# Patient Record
Sex: Male | Born: 1940
Health system: Southern US, Community
[De-identification: ages and names within clinical notes are randomized; demographics above are authoritative.]

## PROBLEM LIST (undated history)

## (undated) DIAGNOSIS — E785 Hyperlipidemia, unspecified: Secondary | ICD-10-CM

## (undated) DIAGNOSIS — I480 Paroxysmal atrial fibrillation: Secondary | ICD-10-CM

## (undated) DIAGNOSIS — J189 Pneumonia, unspecified organism: Secondary | ICD-10-CM

## (undated) DIAGNOSIS — E119 Type 2 diabetes mellitus without complications: Secondary | ICD-10-CM

## (undated) DIAGNOSIS — D649 Anemia, unspecified: Secondary | ICD-10-CM

## (undated) DIAGNOSIS — E11319 Type 2 diabetes mellitus with unspecified diabetic retinopathy without macular edema: Secondary | ICD-10-CM

## (undated) DIAGNOSIS — H409 Unspecified glaucoma: Secondary | ICD-10-CM

## (undated) DIAGNOSIS — R001 Bradycardia, unspecified: Secondary | ICD-10-CM

## (undated) DIAGNOSIS — I2510 Atherosclerotic heart disease of native coronary artery without angina pectoris: Secondary | ICD-10-CM

## (undated) DIAGNOSIS — R0600 Dyspnea, unspecified: Secondary | ICD-10-CM

## (undated) DIAGNOSIS — N189 Chronic kidney disease, unspecified: Secondary | ICD-10-CM

## (undated) DIAGNOSIS — I10 Essential (primary) hypertension: Secondary | ICD-10-CM

## (undated) DIAGNOSIS — R0609 Other forms of dyspnea: Secondary | ICD-10-CM

## (undated) DIAGNOSIS — E669 Obesity, unspecified: Secondary | ICD-10-CM

## (undated) DIAGNOSIS — R0789 Other chest pain: Secondary | ICD-10-CM

## (undated) HISTORY — DX: Other forms of dyspnea: R06.09

## (undated) HISTORY — DX: Paroxysmal atrial fibrillation: I48.0

## (undated) HISTORY — DX: Essential (primary) hypertension: I10

## (undated) HISTORY — DX: Type 2 diabetes mellitus without complications: E11.9

## (undated) HISTORY — DX: Chronic kidney disease, unspecified: N18.9

## (undated) HISTORY — DX: Type 2 diabetes mellitus with unspecified diabetic retinopathy without macular edema: E11.319

## (undated) HISTORY — DX: Obesity, unspecified: E66.9

## (undated) HISTORY — DX: Dyspnea, unspecified: R06.00

## (undated) HISTORY — DX: Hyperlipidemia, unspecified: E78.5

## (undated) HISTORY — DX: Unspecified glaucoma: H40.9

## (undated) HISTORY — DX: Other chest pain: R07.89

## (undated) HISTORY — PX: ACHILLES TENDON SURGERY: SHX542

---

## 2007-12-28 ENCOUNTER — Emergency Department (HOSPITAL_COMMUNITY): Admission: EM | Admit: 2007-12-28 | Discharge: 2007-12-29 | Payer: Self-pay | Admitting: Emergency Medicine

## 2008-04-09 HISTORY — PX: OTHER SURGICAL HISTORY: SHX169

## 2008-04-10 HISTORY — PX: DOPPLER ECHOCARDIOGRAPHY: SHX263

## 2009-05-28 ENCOUNTER — Encounter: Admission: RE | Admit: 2009-05-28 | Discharge: 2009-05-28 | Payer: Self-pay | Admitting: Cardiology

## 2009-06-01 ENCOUNTER — Encounter: Admission: RE | Admit: 2009-06-01 | Discharge: 2009-06-01 | Payer: Self-pay | Admitting: Cardiology

## 2009-07-02 ENCOUNTER — Encounter: Admission: RE | Admit: 2009-07-02 | Discharge: 2009-07-02 | Payer: Self-pay | Admitting: Cardiology

## 2009-07-08 ENCOUNTER — Ambulatory Visit (HOSPITAL_COMMUNITY): Admission: RE | Admit: 2009-07-08 | Discharge: 2009-07-08 | Payer: Self-pay | Admitting: Cardiology

## 2009-07-08 HISTORY — PX: CARDIAC CATHETERIZATION: SHX172

## 2010-06-23 LAB — GLUCOSE, CAPILLARY
Glucose-Capillary: 146 mg/dL — ABNORMAL HIGH (ref 70–99)
Glucose-Capillary: 194 mg/dL — ABNORMAL HIGH (ref 70–99)

## 2012-06-09 ENCOUNTER — Encounter: Payer: Self-pay | Admitting: *Deleted

## 2012-06-18 ENCOUNTER — Encounter: Payer: Self-pay | Admitting: *Deleted

## 2012-06-27 ENCOUNTER — Encounter: Payer: Self-pay | Admitting: Cardiology

## 2014-06-29 ENCOUNTER — Emergency Department (HOSPITAL_COMMUNITY): Payer: Medicare Other

## 2014-06-29 ENCOUNTER — Encounter (HOSPITAL_COMMUNITY): Payer: Self-pay | Admitting: Nurse Practitioner

## 2014-06-29 ENCOUNTER — Inpatient Hospital Stay (HOSPITAL_COMMUNITY)
Admission: EM | Admit: 2014-06-29 | Discharge: 2014-06-30 | DRG: 195 | Disposition: A | Discharge status: Home / Self Care | Payer: Medicare Other | Attending: Internal Medicine | Admitting: Internal Medicine

## 2014-06-29 DIAGNOSIS — I4430 Unspecified atrioventricular block: Secondary | ICD-10-CM | POA: Diagnosis not present

## 2014-06-29 DIAGNOSIS — J189 Pneumonia, unspecified organism: Principal | ICD-10-CM | POA: Diagnosis present

## 2014-06-29 DIAGNOSIS — R001 Bradycardia, unspecified: Secondary | ICD-10-CM | POA: Diagnosis present

## 2014-06-29 DIAGNOSIS — I441 Atrioventricular block, second degree: Secondary | ICD-10-CM | POA: Diagnosis not present

## 2014-06-29 DIAGNOSIS — I2510 Atherosclerotic heart disease of native coronary artery without angina pectoris: Secondary | ICD-10-CM | POA: Diagnosis present

## 2014-06-29 DIAGNOSIS — I440 Atrioventricular block, first degree: Secondary | ICD-10-CM | POA: Diagnosis present

## 2014-06-29 DIAGNOSIS — E785 Hyperlipidemia, unspecified: Secondary | ICD-10-CM | POA: Diagnosis present

## 2014-06-29 DIAGNOSIS — Z6838 Body mass index (BMI) 38.0-38.9, adult: Secondary | ICD-10-CM | POA: Diagnosis not present

## 2014-06-29 DIAGNOSIS — E119 Type 2 diabetes mellitus without complications: Secondary | ICD-10-CM

## 2014-06-29 DIAGNOSIS — D649 Anemia, unspecified: Secondary | ICD-10-CM | POA: Diagnosis present

## 2014-06-29 DIAGNOSIS — E876 Hypokalemia: Secondary | ICD-10-CM

## 2014-06-29 DIAGNOSIS — I10 Essential (primary) hypertension: Secondary | ICD-10-CM | POA: Diagnosis present

## 2014-06-29 DIAGNOSIS — E669 Obesity, unspecified: Secondary | ICD-10-CM | POA: Diagnosis present

## 2014-06-29 DIAGNOSIS — E118 Type 2 diabetes mellitus with unspecified complications: Secondary | ICD-10-CM

## 2014-06-29 HISTORY — DX: Anemia, unspecified: D64.9

## 2014-06-29 HISTORY — DX: Pneumonia, unspecified organism: J18.9

## 2014-06-29 HISTORY — DX: Bradycardia, unspecified: R00.1

## 2014-06-29 HISTORY — DX: Atherosclerotic heart disease of native coronary artery without angina pectoris: I25.10

## 2014-06-29 LAB — I-STAT TROPONIN, ED: TROPONIN I, POC: 0.01 ng/mL (ref 0.00–0.08)

## 2014-06-29 LAB — BASIC METABOLIC PANEL
Anion gap: 11 (ref 5–15)
BUN: 16 mg/dL (ref 6–23)
CALCIUM: 9.8 mg/dL (ref 8.4–10.5)
CO2: 29 mmol/L (ref 19–32)
CREATININE: 1.38 mg/dL — AB (ref 0.50–1.35)
Chloride: 98 mmol/L (ref 96–112)
GFR calc non Af Amer: 50 mL/min — ABNORMAL LOW (ref >90)
GFR, EST AFRICAN AMERICAN: 57 mL/min — AB (ref >90)
GLUCOSE: 234 mg/dL — AB (ref 70–99)
Potassium: 3.4 mmol/L — ABNORMAL LOW (ref 3.5–5.1)
SODIUM: 138 mmol/L (ref 135–145)

## 2014-06-29 LAB — TSH: TSH: 2.132 u[IU]/mL (ref 0.350–4.500)

## 2014-06-29 LAB — TROPONIN I: Troponin I: 0.03 ng/mL (ref <0.031)

## 2014-06-29 LAB — CBC
HEMATOCRIT: 36.4 % — AB (ref 39.0–52.0)
Hemoglobin: 12 g/dL — ABNORMAL LOW (ref 13.0–17.0)
MCH: 28.8 pg (ref 26.0–34.0)
MCHC: 33 g/dL (ref 30.0–36.0)
MCV: 87.5 fL (ref 78.0–100.0)
PLATELETS: 263 10*3/uL (ref 150–400)
RBC: 4.16 MIL/uL — AB (ref 4.22–5.81)
RDW: 13.5 % (ref 11.5–15.5)
WBC: 6.9 10*3/uL (ref 4.0–10.5)

## 2014-06-29 LAB — SEDIMENTATION RATE: SED RATE: 10 mm/h (ref 0–16)

## 2014-06-29 LAB — BRAIN NATRIURETIC PEPTIDE: B Natriuretic Peptide: 95.2 pg/mL (ref 0.0–100.0)

## 2014-06-29 LAB — MAGNESIUM: Magnesium: 1.5 mg/dL (ref 1.5–2.5)

## 2014-06-29 MED ORDER — ENOXAPARIN SODIUM 30 MG/0.3ML ~~LOC~~ SOLN
30.0000 mg | SUBCUTANEOUS | Status: DC
  Administered 2014-06-29: 30 mg via SUBCUTANEOUS
  Filled 2014-06-29 (×2): qty 0.3

## 2014-06-29 MED ORDER — HYDRALAZINE HCL 100 MG PO TABS: 100.0000 mg | ORAL_TABLET | Freq: Three times a day (TID) | ORAL | Status: DC

## 2014-06-29 MED ORDER — LISINOPRIL 40 MG PO TABS
40.0000 mg | ORAL_TABLET | Freq: Every day | ORAL | Status: DC
  Administered 2014-06-30: 40 mg via ORAL
  Filled 2014-06-29: qty 1

## 2014-06-29 MED ORDER — SIMVASTATIN 10 MG PO TABS
10.0000 mg | ORAL_TABLET | Freq: Every day | ORAL | Status: DC
  Administered 2014-06-29: 10 mg via ORAL
  Filled 2014-06-29 (×2): qty 1

## 2014-06-29 MED ORDER — METFORMIN HCL 500 MG PO TABS: 1000.0000 mg | ORAL_TABLET | Freq: Two times a day (BID) | ORAL | Status: DC

## 2014-06-29 MED ORDER — SODIUM CHLORIDE 0.9 % IJ SOLN: 3.0000 mL | Freq: Two times a day (BID) | INTRAMUSCULAR | Status: DC

## 2014-06-29 MED ORDER — ADULT MULTIVITAMIN W/MINERALS CH
1.0000 | ORAL_TABLET | Freq: Every day | ORAL | Status: DC
  Administered 2014-06-30: 1 via ORAL
  Filled 2014-06-29: qty 1

## 2014-06-29 MED ORDER — ISOSORBIDE MONONITRATE ER 60 MG PO TB24
60.0000 mg | ORAL_TABLET | Freq: Every day | ORAL | Status: DC
  Administered 2014-06-30: 60 mg via ORAL
  Filled 2014-06-29: qty 1

## 2014-06-29 MED ORDER — KRILL OIL 300 MG PO CAPS: 300.0000 mg | ORAL_CAPSULE | Freq: Every day | ORAL | Status: DC

## 2014-06-29 MED ORDER — INSULIN ASPART 100 UNIT/ML ~~LOC~~ SOLN
0.0000 [IU] | Freq: Three times a day (TID) | SUBCUTANEOUS | Status: DC
  Administered 2014-06-30: 3 [IU] via SUBCUTANEOUS
  Administered 2014-06-30: 2 [IU] via SUBCUTANEOUS

## 2014-06-29 MED ORDER — ASPIRIN EC 81 MG PO TBEC
81.0000 mg | DELAYED_RELEASE_TABLET | Freq: Every day | ORAL | Status: DC
  Administered 2014-06-30: 81 mg via ORAL
  Filled 2014-06-29: qty 1

## 2014-06-29 MED ORDER — INSULIN ASPART 100 UNIT/ML ~~LOC~~ SOLN
0.0000 [IU] | Freq: Every day | SUBCUTANEOUS | Status: DC
  Administered 2014-06-30: 2 [IU] via SUBCUTANEOUS

## 2014-06-29 MED ORDER — CEFTRIAXONE SODIUM IN DEXTROSE 20 MG/ML IV SOLN
1.0000 g | INTRAVENOUS | Status: DC
  Filled 2014-06-29: qty 50

## 2014-06-29 MED ORDER — HYDRALAZINE HCL 50 MG PO TABS
100.0000 mg | ORAL_TABLET | Freq: Three times a day (TID) | ORAL | Status: DC
  Administered 2014-06-29 – 2014-06-30 (×2): 100 mg via ORAL
  Filled 2014-06-29 (×5): qty 2

## 2014-06-29 MED ORDER — ACETAMINOPHEN 650 MG RE SUPP
650.0000 mg | Freq: Four times a day (QID) | RECTAL | Status: DC | PRN
Start: 2014-06-29 — End: 2014-06-30

## 2014-06-29 MED ORDER — CEFTRIAXONE SODIUM 1 G IJ SOLR
1.0000 g | Freq: Once | INTRAMUSCULAR | Status: AC
  Administered 2014-06-29: 1 g via INTRAVENOUS
  Filled 2014-06-29: qty 10

## 2014-06-29 MED ORDER — NITROGLYCERIN 0.4 MG SL SUBL: 0.4000 mg | SUBLINGUAL_TABLET | SUBLINGUAL | Status: DC | PRN

## 2014-06-29 MED ORDER — DEXTROSE 5 % IV SOLN
500.0000 mg | INTRAVENOUS | Status: DC
  Filled 2014-06-29: qty 500

## 2014-06-29 MED ORDER — INSULIN GLARGINE 100 UNIT/ML ~~LOC~~ SOLN
25.0000 [IU] | Freq: Every day | SUBCUTANEOUS | Status: DC
  Administered 2014-06-30: 25 [IU] via SUBCUTANEOUS
  Filled 2014-06-29 (×2): qty 0.25

## 2014-06-29 MED ORDER — HYDROCHLOROTHIAZIDE 50 MG PO TABS
50.0000 mg | ORAL_TABLET | Freq: Every day | ORAL | Status: DC
  Administered 2014-06-30: 50 mg via ORAL
  Filled 2014-06-29: qty 1

## 2014-06-29 MED ORDER — ACETAMINOPHEN 325 MG PO TABS
650.0000 mg | ORAL_TABLET | Freq: Four times a day (QID) | ORAL | Status: DC | PRN
Start: 2014-06-29 — End: 2014-06-30

## 2014-06-29 MED ORDER — LISINOPRIL-HYDROCHLOROTHIAZIDE 20-25 MG PO TABS: 2.0000 | ORAL_TABLET | Freq: Every day | ORAL | Status: DC

## 2014-06-29 MED ORDER — FERROUS SULFATE 325 (65 FE) MG PO TABS
325.0000 mg | ORAL_TABLET | Freq: Every day | ORAL | Status: DC
  Administered 2014-06-30: 325 mg via ORAL
  Filled 2014-06-29 (×2): qty 1

## 2014-06-29 MED ORDER — DEXTROSE 5 % IV SOLN
500.0000 mg | Freq: Once | INTRAVENOUS | Status: AC
  Administered 2014-06-29: 500 mg via INTRAVENOUS
  Filled 2014-06-29: qty 500

## 2014-06-29 MED ORDER — SODIUM CHLORIDE 0.9 % IV SOLN
INTRAVENOUS | Status: DC
  Administered 2014-06-29: 21:00:00 via INTRAVENOUS

## 2014-06-29 MED ORDER — METFORMIN HCL 500 MG PO TABS
1000.0000 mg | ORAL_TABLET | Freq: Two times a day (BID) | ORAL | Status: DC
  Administered 2014-06-30: 1000 mg via ORAL
  Filled 2014-06-29 (×3): qty 2

## 2014-06-29 MED ORDER — POTASSIUM CHLORIDE CRYS ER 20 MEQ PO TBCR
40.0000 meq | EXTENDED_RELEASE_TABLET | Freq: Once | ORAL | Status: AC
  Administered 2014-06-29: 40 meq via ORAL
  Filled 2014-06-29: qty 2

## 2014-06-29 NOTE — Consult Note (Signed)
CARDIOLOGY CONSULT NOTE  Patient ID: Matthew Sellers  MRN: [id number]  DOB: 10-27-1940  Admit date: 06/29/2014 Date of Consult: 06/29/2014  Primary Physician: No primary care provider on file. [facility]  Reason for Consultation: 2:1 AV block  History of Present Illness: Matthew Sellers is a 74 y.o. male  With DMII, hypertension, hyperlipidemia, OSA on CPAP, and known AV block - last with Mobitz I in 2012.  He states he had been worked up recently for a PPM at the [facility] but we do not have those records.  He presented today with a pneumonia and was found to be in 2:1 AVB.  Asymptomatic - no lightheadedness of syncope, BP stable.    Past Medical History  Diagnosis Date  . Diabetes mellitus without complication   . Hypertension   . Dyslipidemia   . Obesity   . Dyspnea on exertion   . Chest tightness   . Bradycardia   . Anemia   . CAD (coronary artery disease)   . Pneumonia     Past Surgical History  Procedure Laterality Date  . Doppler echocardiography  04/10/2008    LVEF 70-80% ,norm  . Stress myoview  04/09/2008    EF 54%; LV  norm  . Cardiac catheterization  07/08/2009    mild to mod . prox LAD 50% (Dr. Garen LahEichhorn)  . Achilles tendon surgery      for rupture     Home Meds: Prior to Admission medications   Medication Sig Start Date End Date Taking? Authorizing Provider  aspirin EC 81 MG tablet Take 81 mg by mouth daily.   Yes Historical Provider, MD  ferrous sulfate 325 (65 FE) MG tablet Take 325 mg by mouth daily with breakfast.   Yes Historical Provider, MD  hydrALAZINE (APRESOLINE) 100 MG tablet Take 100 mg by mouth 3 (three) times daily.   Yes Historical Provider, MD  insulin glargine (LANTUS) 100 UNIT/ML injection Inject 25 Units into the skin at bedtime.   Yes Historical Provider, MD  isosorbide mononitrate (IMDUR) 60 MG 24 hr tablet Take 60 mg by mouth daily.   Yes Historical Provider, MD  Boris LownKrill Oil 300 MG CAPS Take 300 mg by mouth  daily.   Yes Historical Provider, MD  lisinopril-hydrochlorothiazide (PRINZIDE,ZESTORETIC) 20-25 MG per tablet Take 2 tablets by mouth daily.   Yes Historical Provider, MD  metFORMIN (GLUCOPHAGE) 1000 MG tablet Take 1,000 mg by mouth 2 (two) times daily with a meal.   Yes Historical Provider, MD  Multiple Vitamin (MULTIVITAMIN WITH MINERALS) TABS tablet Take 1 tablet by mouth daily.   Yes Historical Provider, MD  nitroGLYCERIN (NITROSTAT) 0.4 MG SL tablet Place 0.4 mg under the tongue every 5 (five) minutes as needed for chest pain.   Yes Historical Provider, MD  simvastatin (ZOCOR) 20 MG tablet Take 10 mg by mouth at bedtime. Take 1/2 tablet  At bedtime   Yes Historical Provider, MD    Current Medications: . [START ON 06/30/2014] aspirin EC  81 mg Oral Daily  . azithromycin  500 mg Intravenous Once  . [START ON 06/30/2014] azithromycin  500 mg Intravenous Q24H  . [START ON 06/30/2014] cefTRIAXone (ROCEPHIN)  IV  1 g Intravenous Q24H  . enoxaparin (LOVENOX) injection  30 mg Subcutaneous Q24H  . [START ON 06/30/2014] ferrous sulfate  325 mg Oral Q breakfast  . hydrALAZINE  100 mg Oral 3 times per day  . [START ON 06/30/2014] hydrochlorothiazide  50 mg Oral  Daily  . insulin aspart  0-5 Units Subcutaneous QHS  . [START ON 06/30/2014] insulin aspart  0-9 Units Subcutaneous TID WC  . insulin glargine  25 Units Subcutaneous QHS  . [START ON 06/30/2014] isosorbide mononitrate  60 mg Oral Daily  . [START ON 06/30/2014] lisinopril  40 mg Oral Daily  . [START ON 06/30/2014] metFORMIN  1,000 mg Oral BID WC  . [START ON 06/30/2014] multivitamin with minerals  1 tablet Oral Daily  . simvastatin  10 mg Oral QHS  . sodium chloride  3 mL Intravenous Q12H     Allergies:   No Known Allergies  Social History:   The patient  reports that he has never smoked. He does not have any smokeless tobacco history on file. He reports that he does not drink alcohol.    Family History:   The patient's family history includes  Leukemia in his mother.   ROS:  Please see the history of present illness.   All other systems reviewed and negative.   Vital Signs: Blood pressure 207/60, pulse 43, temperature 98.1 F (36.7 C), temperature source Oral, resp. rate 21, height  (1.778 m), weight 122.471 kg (270 lb), SpO2 96 %.   PHYSICAL EXAM: General:  Well nourished, well developed, in no acute distres HEENT: normal Lymph: no adenopathy Neck: no JVD Endocrine:  No thryomegaly Vascular: No carotid bruits; FA pulses 2+ bilaterally without bruits Cardiac:  normal S1, S2; bradycardic; no murmur Lungs:  clear to auscultation bilaterally, no wheezing, rhonchi or rales Abd: soft, nontender, no hepatomegaly Ext: no edema Musculoskeletal:  No deformities, BUE and BLE strength normal and equal Skin: warm and dry Neuro:  CNs 2-12 intact, no focal abnormalities noted Psych:  Normal affect   EKG:  2:1 AV block.  No ischemic changes  Labs:  Recent Labs  06/29/14 2045  TROPONINI 0.03   Lab Results  Component Value Date   WBC 6.9 06/29/2014   HGB 12.0* 06/29/2014   HCT 36.4* 06/29/2014   MCV 87.5 06/29/2014   PLT 263 06/29/2014    Recent Labs Lab 06/29/14 1624  NA 138  K 3.4*  CL 98  CO2 29  BUN 16  CREATININE 1.38*  CALCIUM 9.8  GLUCOSE 234*   No results found for: CHOL, HDL, LDLCALC, TRIG No results found for: DDIMER  Radiology/Studies:  Dg Chest 2 View  06/29/2014   CLINICAL DATA:  74 year old male with cough and congestion for 2 days. Initial encounter.  EXAM: CHEST  2 VIEW  COMPARISON:  07/02/2009.  FINDINGS: Mild cardiomegaly has not significantly changed. Stable lung volumes. Other mediastinal contours are within normal limits. Visualized tracheal air column is within normal limits. No pneumothorax, pulmonary edema, pleural effusion or consolidation. There is asymmetric vague increased opacity in the right upper lobe on only the frontal view. No acute osseous abnormality identified.   IMPRESSION: Vague asymmetric increased right upper lobe opacity on the frontal view suspicious for bronchopneumonia in this setting. Post treatment radiographs recommended to document resolution.   Electronically Signed   By: Odessa Fleming M.D.   On: 06/29/2014 16:52    ASSESSMENT AND PLAN:  1. 2:1 AV block - Continue to monitor overnight and with ambulation - Replete K > 4, Mg > 2 - If HR increases with ambulation and patient asymptomatic, can likely hold off on pacemaker at this time.  Blood pressure is stable (high). - However, if bradycardia is symptomatic with ambulation, will need to consider PPM.  Kelly Splinter 06/29/2014 10:56 PM

## 2014-06-29 NOTE — ED Notes (Signed)
He c/o productive cough and SOB since Friday. He is concerned because he has hx of pneumonia. He did receive the pneumonia vaccine. He denies pain or fevers. A&Ox4, resp e/u

## 2014-06-29 NOTE — Progress Notes (Signed)
Pt arrived to 5w rm 25, a/o x4, no c/o pain, level 0/10, up to BR indep.

## 2014-06-29 NOTE — H&P (Signed)
Matthew Sellers is an 74 y.o. male.    Dr. Laurell Roof [facility])  Chief Complaint: cough HPI: 74 yo male with dm2, hypertension, hyperlipidemia, OSA on cpap, apparently c/o cough with yellow greyish color x2 days,  Slight sob, slight wheezing.  Denies fever, chills, cp, palp, n/v, abd pain, diarrhea, brbpr, black stool.  Pt presented to ED due to his wife, and was found to have RUL pneumonia on CXR.  Pt was also noted to have bradycardia, and 2nd degree avb. ED contacted cardiology Dr. Kennith Center, who per ED will consult and will be admitted for pneumonia, and 2nd degree AVB.  Past Medical History  Diagnosis Date  . Diabetes mellitus without complication   . Hypertension   . Dyslipidemia   . Obesity   . Dyspnea on exertion   . Chest tightness   . Bradycardia   . Anemia   . CAD (coronary artery disease)   . Pneumonia     Past Surgical History  Procedure Laterality Date  . Doppler echocardiography  04/10/2008    LVEF 70-80% ,norm  . Stress myoview  04/09/2008    EF 54%; LV  norm  . Cardiac catheterization  07/08/2009    mild to mod . prox LAD 50% (Dr. Felton Clinton)  . Achilles tendon surgery      for rupture    Family History  Problem Relation Age of Onset  . Leukemia Mother    Social History:  reports that he has never smoked. He does not have any smokeless tobacco history on file. He reports that he does not drink alcohol. His drug history is not on file.  Allergies: No Known Allergies   (Not in a hospital admission)  Results for orders placed or performed during the hospital encounter of 06/29/14 (from the past 48 hour(s))  BNP (order ONLY if patient complains of dyspnea/SOB AND you have documented it for THIS visit)     Status: None   Collection Time: 06/29/14  4:24 PM  Result Value Ref Range   B Natriuretic Peptide 95.2 0.0 - 100.0 pg/mL  Basic metabolic panel     Status: Abnormal   Collection Time: 06/29/14  4:24 PM  Result Value Ref Range   Sodium 138 135 - 145  mmol/L   Potassium 3.4 (L) 3.5 - 5.1 mmol/L   Chloride 98 96 - 112 mmol/L   CO2 29 19 - 32 mmol/L   Glucose, Bld 234 (H) 70 - 99 mg/dL   BUN 16 6 - 23 mg/dL   Creatinine, Ser 1.38 (H) 0.50 - 1.35 mg/dL   Calcium 9.8 8.4 - 10.5 mg/dL   GFR calc non Af Amer 50 (L) >90 mL/min   GFR calc Af Amer 57 (L) >90 mL/min    Comment: (NOTE) The eGFR has been calculated using the CKD EPI equation. This calculation has not been validated in all clinical situations. eGFR's persistently <90 mL/min signify possible Chronic Kidney Disease.    Anion gap 11 5 - 15  CBC     Status: Abnormal   Collection Time: 06/29/14  4:24 PM  Result Value Ref Range   WBC 6.9 4.0 - 10.5 K/uL   RBC 4.16 (L) 4.22 - 5.81 MIL/uL   Hemoglobin 12.0 (L) 13.0 - 17.0 g/dL   HCT 36.4 (L) 39.0 - 52.0 %   MCV 87.5 78.0 - 100.0 fL   MCH 28.8 26.0 - 34.0 pg   MCHC 33.0 30.0 - 36.0 g/dL   RDW 13.5 11.5 -  15.5 %   Platelets 263 150 - 400 K/uL  Magnesium     Status: None   Collection Time: 06/29/14  4:24 PM  Result Value Ref Range   Magnesium 1.5 1.5 - 2.5 mg/dL  I-stat troponin, ED (not at [facility])     Status: None   Collection Time: 06/29/14  4:50 PM  Result Value Ref Range   Troponin i, poc 0.01 0.00 - 0.08 ng/mL   Comment 3            Comment: Due to the release kinetics of cTnI, a negative result within the first hours of the onset of symptoms does not rule out myocardial infarction with certainty. If myocardial infarction is still suspected, repeat the test at appropriate intervals.    Dg Chest 2 View  06/29/2014   CLINICAL DATA:  74 year old male with cough and congestion for 2 days. Initial encounter.  EXAM: CHEST  2 VIEW  COMPARISON:  07/02/2009.  FINDINGS: Mild cardiomegaly has not significantly changed. Stable lung volumes. Other mediastinal contours are within normal limits. Visualized tracheal air column is within normal limits. No pneumothorax, pulmonary edema, pleural effusion or consolidation. There is  asymmetric vague increased opacity in the right upper lobe on only the frontal view. No acute osseous abnormality identified.  IMPRESSION: Vague asymmetric increased right upper lobe opacity on the frontal view suspicious for bronchopneumonia in this setting. Post treatment radiographs recommended to document resolution.   Electronically Signed   By: Genevie Ann M.D.   On: 06/29/2014 16:52    Review of Systems  Constitutional: Negative.   HENT: Negative.   Eyes: Negative.   Respiratory: Positive for cough, sputum production, shortness of breath and wheezing. Negative for hemoptysis.   Cardiovascular: Negative.   Gastrointestinal: Negative.   Genitourinary: Negative.   Musculoskeletal: Negative.   Skin: Negative.   Neurological: Negative.   Endo/Heme/Allergies: Negative.   Psychiatric/Behavioral: Negative.     Blood pressure 183/63, pulse 44, temperature 98.2 F (36.8 C), temperature source Oral, resp. rate 21, height _0  (1.778 m), weight 122.471 kg (270 lb), SpO2 99 %. Physical Exam  Constitutional: He is oriented to person, place, and time. He appears well-developed and well-nourished.  HENT:  Head: Normocephalic and atraumatic.  Eyes: Conjunctivae and EOM are normal. Pupils are equal, round, and reactive to light. No scleral icterus.  Neck: Normal range of motion. Neck supple. No JVD present. No tracheal deviation present. No thyromegaly present.  Cardiovascular: Normal rate and regular rhythm.  Exam reveals no gallop and no friction rub.   No murmur heard. Respiratory: Effort normal. No respiratory distress. He has no wheezes. He has rales.  GI: Soft. Bowel sounds are normal. He exhibits no distension. There is no tenderness. There is no rebound and no guarding.  Musculoskeletal: Normal range of motion. He exhibits no edema or tenderness.  Lymphadenopathy:    He has no cervical adenopathy.  Neurological: He is alert and oriented to person, place, and time. He has normal reflexes.  He displays normal reflexes. No cranial nerve deficit. He exhibits normal muscle tone. Coordination normal.  Skin: Skin is warm and dry. No rash noted. No erythema. No pallor.  Psychiatric: He has a normal mood and affect. His behavior is normal. Judgment and thought content normal.     Assessment/Plan CAP Sputum culture Blood culture x2 Rocephin 1gm iv qday, zithromax 569m iv qday  Dm2 fsbs ac qhs, iss  Hypokalemia Replete Check cmp in am  Bradycardia Trop i q6h  x3 Check tsh Appreciate cardiology input  Anemia Check cbc in am, check esr, if high consider spep, upep  Hypertension/hyperlipidemia Cont current medications  DVT prophylaxis: scd and lovenox    Jani Gravel 06/29/2014, 7:50 PM

## 2014-06-29 NOTE — ED Notes (Signed)
Report from cricket, rn.  Pt care assumed 

## 2014-06-29 NOTE — ED Provider Notes (Signed)
CSN: [id number]     Arrival date & time 06/29/14  1608 History   First MD Initiated Contact with Patient 06/29/14 1629     Chief Complaint  Patient presents with  . Cough     (Consider location/radiation/quality/duration/timing/severity/associated sxs/prior Treatment) HPI Matthew Sellers is a 74 year old male with past medical history of diabetes, hypertension who presents the ER complaining of productive cough for the past 3 days. Patient describes some mild associated chest congestion. Patient describes some dyspnea on exertion, however he states that his been ongoing for several weeks, and nothing new over the past several days. Patient denies chest pain, shortness of breath, dizziness, weakness, blurred vision, headache, hemoptysis, nausea, vomiting, abdominal pain, dysuria. Patient states he has had pneumonia in the past and his son symptoms today feel similar.  Past Medical History  Diagnosis Date  . Diabetes mellitus without complication   . Hypertension   . Dyslipidemia   . Obesity   . Dyspnea on exertion   . Chest tightness   . Bradycardia   . Anemia   . CAD (coronary artery disease)   . Pneumonia    Past Surgical History  Procedure Laterality Date  . Doppler echocardiography  04/10/2008    LVEF 70-80% ,norm  . Stress myoview  04/09/2008    EF 54%; LV  norm  . Cardiac catheterization  07/08/2009    mild to mod . prox LAD 50% (Dr. Garen LahEichhorn)  . Achilles tendon surgery      for rupture   Family History  Problem Relation Age of Onset  . Leukemia Mother    History  Substance Use Topics  . Smoking status: Never Smoker   . Smokeless tobacco: Not on file  . Alcohol Use: No    Review of Systems  Constitutional: Negative for fever.  HENT: Negative for trouble swallowing.   Eyes: Negative for visual disturbance.  Respiratory: Positive for cough. Negative for shortness of breath.   Cardiovascular: Negative for chest pain.  Gastrointestinal: Negative for nausea,  vomiting and abdominal pain.  Genitourinary: Negative for dysuria.  Musculoskeletal: Negative for neck pain.  Skin: Negative for rash.  Neurological: Negative for dizziness, weakness and numbness.  Psychiatric/Behavioral: Negative.       Allergies  Review of patient's allergies indicates no known allergies.  Home Medications   Prior to Admission medications   Medication Sig Start Date End Date Taking? Authorizing Provider  aspirin EC 81 MG tablet Take 81 mg by mouth daily.   Yes Historical Provider, MD  ferrous sulfate 325 (65 FE) MG tablet Take 325 mg by mouth daily with breakfast.   Yes Historical Provider, MD  hydrALAZINE (APRESOLINE) 100 MG tablet Take 100 mg by mouth 3 (three) times daily.   Yes Historical Provider, MD  insulin glargine (LANTUS) 100 UNIT/ML injection Inject 25 Units into the skin at bedtime.   Yes Historical Provider, MD  isosorbide mononitrate (IMDUR) 60 MG 24 hr tablet Take 60 mg by mouth daily.   Yes Historical Provider, MD  Boris LownKrill Oil 300 MG CAPS Take 300 mg by mouth daily.   Yes Historical Provider, MD  lisinopril-hydrochlorothiazide (PRINZIDE,ZESTORETIC) 20-25 MG per tablet Take 2 tablets by mouth daily.   Yes Historical Provider, MD  metFORMIN (GLUCOPHAGE) 1000 MG tablet Take 1,000 mg by mouth 2 (two) times daily with a meal.   Yes Historical Provider, MD  Multiple Vitamin (MULTIVITAMIN WITH MINERALS) TABS tablet Take 1 tablet by mouth daily.   Yes Historical Provider, MD  nitroGLYCERIN (NITROSTAT)  0.4 MG SL tablet Place 0.4 mg under the tongue every 5 (five) minutes as needed for chest pain.   Yes Historical Provider, MD  simvastatin (ZOCOR) 20 MG tablet Take 10 mg by mouth at bedtime. Take 1/2 tablet  At bedtime   Yes Historical Provider, MD   BP 165/41 mmHg  Pulse 41  Temp(Src) 98.1 F (36.7 C) (Oral)  Resp 21  Ht  (1.778 m)  Wt 270 lb (122.471 kg)  BMI 38.74 kg/m2  SpO2 96% Physical Exam  Constitutional: He is oriented to person, place, and  time. He appears well-developed and well-nourished. No distress.  HENT:  Head: Normocephalic and atraumatic.  Mouth/Throat: Oropharynx is clear and moist. No oropharyngeal exudate.  Eyes: Right eye exhibits no discharge. Left eye exhibits no discharge. No scleral icterus.  Neck: Normal range of motion.  Cardiovascular: Regular rhythm, S1 normal, S2 normal and normal heart sounds.  Bradycardia present.   No murmur heard. Bradycardic in the 40s to 50s.  Pulmonary/Chest: Effort normal and breath sounds normal. No respiratory distress.  Abdominal: Soft. There is no tenderness.  Musculoskeletal: Normal range of motion. He exhibits no edema or tenderness.  Neurological: He is alert and oriented to person, place, and time. No cranial nerve deficit. Coordination normal. GCS eye subscore is 4. GCS verbal subscore is 5. GCS motor subscore is 6.  Patient fully alert, answering questions appropriately in full, clear sentences. Cranial nerves II through XII grossly intact. Motor strength 5 out of 5 in all major muscle groups of upper and lower extremities. Distal sensation intact.  Skin: Skin is warm and dry. No rash noted. He is not diaphoretic.  Psychiatric: He has a normal mood and affect.  Nursing note and vitals reviewed.   ED Course  Procedures (including critical care time) Labs Review Labs Reviewed  BASIC METABOLIC PANEL - Abnormal; Notable for the following:    Potassium 3.4 (*)    Glucose, Bld 234 (*)    Creatinine, Ser 1.38 (*)    GFR calc non Af Amer 50 (*)    GFR calc Af Amer 57 (*)    All other components within normal limits  CBC - Abnormal; Notable for the following:    RBC 4.16 (*)    Hemoglobin 12.0 (*)    HCT 36.4 (*)    All other components within normal limits  GLUCOSE, CAPILLARY - Abnormal; Notable for the following:    Glucose-Capillary 219 (*)    All other components within normal limits  CULTURE, EXPECTORATED SPUTUM-ASSESSMENT  BRAIN NATRIURETIC PEPTIDE  MAGNESIUM   TROPONIN I  TSH  SEDIMENTATION RATE  URINALYSIS, ROUTINE W REFLEX MICROSCOPIC  TROPONIN I  TROPONIN I  HEMOGLOBIN A1C  I-STAT TROPOININ, ED    Imaging Review Dg Chest 2 View  06/29/2014   CLINICAL DATA:  74 year old male with cough and congestion for 2 days. Initial encounter.  EXAM: CHEST  2 VIEW  COMPARISON:  07/02/2009.  FINDINGS: Mild cardiomegaly has not significantly changed. Stable lung volumes. Other mediastinal contours are within normal limits. Visualized tracheal air column is within normal limits. No pneumothorax, pulmonary edema, pleural effusion or consolidation. There is asymmetric vague increased opacity in the right upper lobe on only the frontal view. No acute osseous abnormality identified.  IMPRESSION: Vague asymmetric increased right upper lobe opacity on the frontal view suspicious for bronchopneumonia in this setting. Post treatment radiographs recommended to document resolution.   Electronically Signed   By: Althea Grimmer.D.  On: 06/29/2014 16:52     EKG Interpretation   Date/Time:  Sunday June 29 2014 16:19:11 EDT Ventricular Rate:  46 PR Interval:  226 QRS Duration: 86 QT Interval:  408 QTC Calculation: 357 R Axis:   55 Text Interpretation:  Sinus rhythm with 2nd degree A-V block with 2:1 A-V  conduction Possible Anterior infarct , age undetermined No old tracing to  compare Confirmed by [facility]  MD, Nicholos Johns [phone]) on 06/29/2014 4:39:05  PM      MDM   Final diagnoses:  CAP (community acquired pneumonia)    Patient here with cough, chest x-ray remarkable for upper lobe opacity suspicious for a bronchopneumonia. Ace on patient's history and physical exam, likely patient is experiencing a community acquired pneumonia. Patient is well-appearing on exam, non-tachypnea, non-tachycardic, non-hypoxic, however patient does have new EKG change compared to previous. Patient is noted to have a second-degree type II AV block EKG. This is a change from a possible  first-degree AV block noted on previous clinic visits, however there is no EKG and use to compare to. Based on his knee EKG change, will admit patient for community acquired pneumonia for IV antibiotics as well as consult with cardiology. I spoke with Dr. Jon Billings with cardiology regarding patient's case, and agrees the cardiology will see patient in consult. Dr. Jon Billings recommends repleting patient's potassium and sending a magnesium level. These were sent. Patient admitted to medicine per cardiology recommendations. Patient admitting to telemetry under Dr. Selena Batten.  Patient placed on IV Rocephin and azithromycin. The patient appears reasonably stabilized for admission considering the current resources, flow, and capabilities available in the ED at this time, and I doubt any other [facility] requiring further screening and/or treatment in the ED prior to admission.  BP 165/41 mmHg  Pulse 41  Temp(Src) 98.1 F (36.7 C) (Oral)  Resp 21  Ht  (1.778 m)  Wt 270 lb (122.471 kg)  BMI 38.74 kg/m2  SpO2 96%  Signed,  Ladona Mow, PA-C 1:34 AM  Patient seen and discussed with Dr. Samuel Jester, MD    Ladona Mow, PA-C 06/30/14 0134  Samuel Jester, DO 07/02/14 1347

## 2014-06-30 DIAGNOSIS — I10 Essential (primary) hypertension: Secondary | ICD-10-CM

## 2014-06-30 DIAGNOSIS — I4430 Unspecified atrioventricular block: Secondary | ICD-10-CM

## 2014-06-30 LAB — GLUCOSE, CAPILLARY
Glucose-Capillary: 197 mg/dL — ABNORMAL HIGH (ref 70–99)
Glucose-Capillary: 210 mg/dL — ABNORMAL HIGH (ref 70–99)
Glucose-Capillary: 219 mg/dL — ABNORMAL HIGH (ref 70–99)

## 2014-06-30 LAB — URINALYSIS, ROUTINE W REFLEX MICROSCOPIC
BILIRUBIN URINE: NEGATIVE
Glucose, UA: >1000 mg/dL — AB
Hgb urine dipstick: NEGATIVE
Ketones, ur: NEGATIVE mg/dL
Leukocytes, UA: NEGATIVE
Nitrite: NEGATIVE
Protein, ur: NEGATIVE mg/dL
Specific Gravity, Urine: 1.019 (ref 1.005–1.030)
UROBILINOGEN UA: 1 mg/dL (ref 0.0–1.0)
pH: 5 (ref 5.0–8.0)

## 2014-06-30 LAB — TROPONIN I: Troponin I: <0.03 ng/mL (ref <0.031)

## 2014-06-30 LAB — URINE MICROSCOPIC-ADD ON: WBC, UA: NONE SEEN WBC/hpf (ref <3)

## 2014-06-30 MED ORDER — LEVOFLOXACIN 750 MG PO TABS: 750.0000 mg | ORAL_TABLET | Freq: Every day | ORAL | Status: DC

## 2014-06-30 NOTE — Progress Notes (Signed)
AVS discharge instructions were reviewed with patient. Patient was given prescription for levaquin to take to his pharmacy. Patient stated that he did not have any questions. Volunteers called to assist patient to his transportation.

## 2014-06-30 NOTE — Discharge Instructions (Signed)
Follow with Primary MD in 7 days  ° °Get CBC, CMP, 2 view Chest X ray checked  by Primary MD next visit.  ° ° °Activity: As tolerated with Full fall precautions use walker/cane & assistance as needed ° ° °Disposition Home  ° ° °Diet: Heart Healthy Low Carb. ° °For Heart failure patients - Check your Weight same time everyday, if you gain over 2 pounds, or you develop in leg swelling, experience more shortness of breath or chest pain, call your Primary MD immediately. Follow Cardiac Low Salt Diet and 1.5 lit/day fluid restriction. ° ° °On your next visit with your primary care physician please Get Medicines reviewed and adjusted. ° ° °Please request your Prim.MD to go over all Hospital Tests and Procedure/Radiological results at the follow up, please get all Hospital records sent to your Prim MD by signing hospital release before you go home. ° ° °If you experience worsening of your admission symptoms, develop shortness of breath, life threatening emergency, suicidal or homicidal thoughts you must seek medical attention immediately by calling 911 or calling your MD immediately  if symptoms less severe. ° °You Must read complete instructions/literature along with all the possible adverse reactions/side effects for all the Medicines you take and that have been prescribed to you. Take any new Medicines after you have completely understood and accpet all the possible adverse reactions/side effects.  ° °Do not drive, operating heavy machinery, perform activities at heights, swimming or participation in water activities or provide baby sitting services if your were admitted for syncope or siezures until you have seen by Primary MD or a Neurologist and advised to do so again. ° °Do not drive when taking Pain medications.  ° ° °Do not take more than prescribed Pain, Sleep and Anxiety Medications ° °Special Instructions: If you have smoked or chewed Tobacco  in the last 2 yrs please stop smoking, stop any regular Alcohol  and  or any Recreational drug use. ° °Wear Seat belts while driving. ° ° °Please note ° °You were cared for by a hospitalist during your hospital stay. If you have any questions about your discharge medications or the care you received while you were in the hospital after you are discharged, you can call the unit and asked to speak with the hospitalist on call if the hospitalist that took care of you is not available. Once you are discharged, your primary care physician will handle any further medical issues. Please note that NO REFILLS for any discharge medications will be authorized once you are discharged, as it is imperative that you return to your primary care physician (or establish a relationship with a primary care physician if you do not have one) for your aftercare needs so that they can reassess your need for medications and monitor your lab values. ° °

## 2014-06-30 NOTE — Discharge Summary (Signed)
Matthew Sellers, is a 74 y.o. male  DOB 1940/11/26  MRN [id number].  Admission date:  06/29/2014  Admitting Physician  Pearson Grippe, MD  Discharge Date:  06/30/2014   Primary MD  No primary care provider on file.  Recommendations for primary care physician for things to follow:   Check CBC, BMP and a 2 view chest x-ray in a week.   Admission Diagnosis  CAP (community acquired pneumonia) [J18.9]   Discharge Diagnosis  CAP (community acquired pneumonia) [J18.9]    Principal Problem:   CAP (community acquired pneumonia) Active Problems:   Diabetes   Hypertension   Hyperlipidemia   Hypokalemia   Anemia   Pneumonia      Past Medical History  Diagnosis Date  . Diabetes mellitus without complication   . Hypertension   . Dyslipidemia   . Obesity   . Dyspnea on exertion   . Chest tightness   . Bradycardia   . Anemia   . CAD (coronary artery disease)   . Pneumonia     Past Surgical History  Procedure Laterality Date  . Doppler echocardiography  04/10/2008    LVEF 70-80% ,norm  . Stress myoview  04/09/2008    EF 54%; LV  norm  . Cardiac catheterization  07/08/2009    mild to mod . prox LAD 50% (Dr. Garen Lah)  . Achilles tendon surgery      for rupture       History of present illness and  Hospital Course:     Kindly see H&P for history of present illness and admission details, please review complete Labs, Consult reports and Test reports for all details in brief  HPI  from the history and physical done on the day of admission  74 yo male with dm2, hypertension, hyperlipidemia, OSA on cpap, apparently c/o cough with yellow greyish color x2 days, Slight sob, slight wheezing. Denies fever, chills, cp, palp, n/v, abd pain, diarrhea, brbpr, black stool. Pt presented to ED due to his wife, and was  found to have RUL pneumonia on CXR. Pt was also noted to have bradycardia, and 2nd degree avb. ED contacted cardiology Dr. Jon Billings, who per ED will consult and will be admitted for pneumonia, and 2nd degree AVB.   Hospital Course    1.CAP - stable, completely symptom free, no oxygen demand. We'll place on Levaquin for 5 days and discharged home. Request PCP to check CBC-BMP and a 2 view chest x-ray next visit.   2. Chr. 2:1 AV block with chronic bradycardia. Patient reports history of having a heart rate in 40s for the last 15-20 years without any symptoms, seen by cardiology no further workup. He is completely symptom free with stable blood pressure. Admitted in the hallway without any problems. TSH stable.   3. DM type II and dyslipidemia. A new home regimen.   4. Hypertension. Continue home regimen of ACE inhibitor-diuretic.   5. Hypokalemia. Potassium has been replaced. We will get BMP checked by PCP in a week.  Discharge Condition: Stable   Follow UP  Follow-up Information    Follow up with PCP. Schedule an appointment as soon as possible for a visit in 1 week.        Discharge Instructions  and  Discharge Medications      Discharge Instructions    Discharge instructions    Complete by:  As directed   Follow with Primary MD in 7 days   Get CBC, CMP, 2 view Chest X ray checked  by Primary MD next visit.    Activity: As tolerated with Full fall precautions use walker/cane & assistance as needed   Disposition Home    Diet: Heart Healthy - Low Carb  For Heart failure patients - Check your Weight same time everyday, if you gain over 2 pounds, or you develop in leg swelling, experience more shortness of breath or chest pain, call your Primary MD immediately. Follow Cardiac Low Salt Diet and 1.5 lit/day fluid restriction.   On your next visit with your primary care physician please Get Medicines reviewed and adjusted.   Please request your Prim.MD to go over  all Hospital Tests and Procedure/Radiological results at the follow up, please get all Hospital records sent to your Prim MD by signing hospital release before you go home.   If you experience worsening of your admission symptoms, develop shortness of breath, life threatening emergency, suicidal or homicidal thoughts you must seek medical attention immediately by calling 911 or calling your MD immediately  if symptoms less severe.  You Must read complete instructions/literature along with all the possible adverse reactions/side effects for all the Medicines you take and that have been prescribed to you. Take any new Medicines after you have completely understood and accpet all the possible adverse reactions/side effects.   Do not drive, operating heavy machinery, perform activities at heights, swimming or participation in water activities or provide baby sitting services if your were admitted for syncope or siezures until you have seen by Primary MD or a Neurologist and advised to do so again.  Do not drive when taking Pain medications.    Do not take more than prescribed Pain, Sleep and Anxiety Medications  Special Instructions: If you have smoked or chewed Tobacco  in the last 2 yrs please stop smoking, stop any regular Alcohol  and or any Recreational drug use.  Wear Seat belts while driving.   Please note  You were cared for by a hospitalist during your hospital stay. If you have any questions about your discharge medications or the care you received while you were in the hospital after you are discharged, you can call the unit and asked to speak with the hospitalist on call if the hospitalist that took care of you is not available. Once you are discharged, your primary care physician will handle any further medical issues. Please note that NO REFILLS for any discharge medications will be authorized once you are discharged, as it is imperative that you return to your primary care physician  (or establish a relationship with a primary care physician if you do not have one) for your aftercare needs so that they can reassess your need for medications and monitor your lab values.     Increase activity slowly    Complete by:  As directed             Medication List    TAKE these medications        aspirin EC 81 MG tablet  Take  81 mg by mouth daily.     ferrous sulfate 325 (65 FE) MG tablet  Take 325 mg by mouth daily with breakfast.     hydrALAZINE 100 MG tablet  Commonly known as:  APRESOLINE  Take 100 mg by mouth 3 (three) times daily.     insulin glargine 100 UNIT/ML injection  Commonly known as:  LANTUS  Inject 25 Units into the skin at bedtime.     isosorbide mononitrate 60 MG 24 hr tablet  Commonly known as:  IMDUR  Take 60 mg by mouth daily.     Krill Oil 300 MG Caps  Take 300 mg by mouth daily.     levofloxacin 750 MG tablet  Commonly known as:  LEVAQUIN  Take 1 tablet (750 mg total) by mouth daily.     lisinopril-hydrochlorothiazide 20-25 MG per tablet  Commonly known as:  PRINZIDE,ZESTORETIC  Take 2 tablets by mouth daily.     metFORMIN 1000 MG tablet  Commonly known as:  GLUCOPHAGE  Take 1,000 mg by mouth 2 (two) times daily with a meal.     multivitamin with minerals Tabs tablet  Take 1 tablet by mouth daily.     nitroGLYCERIN 0.4 MG SL tablet  Commonly known as:  NITROSTAT  Place 0.4 mg under the tongue every 5 (five) minutes as needed for chest pain.     simvastatin 20 MG tablet  Commonly known as:  ZOCOR  Take 10 mg by mouth at bedtime. Take 1/2 tablet  At bedtime          Diet and Activity recommendation: See Discharge Instructions above   Consults obtained - Cards   Major procedures and Radiology Reports - PLEASE review detailed and final reports for all details, in brief -       Dg Chest 2 View  06/29/2014   CLINICAL DATA:  74 year old male with cough and congestion for 2 days. Initial encounter.  EXAM: CHEST  2 VIEW   COMPARISON:  07/02/2009.  FINDINGS: Mild cardiomegaly has not significantly changed. Stable lung volumes. Other mediastinal contours are within normal limits. Visualized tracheal air column is within normal limits. No pneumothorax, pulmonary edema, pleural effusion or consolidation. There is asymmetric vague increased opacity in the right upper lobe on only the frontal view. No acute osseous abnormality identified.  IMPRESSION: Vague asymmetric increased right upper lobe opacity on the frontal view suspicious for bronchopneumonia in this setting. Post treatment radiographs recommended to document resolution.   Electronically Signed   By: Odessa Fleming M.D.   On: 06/29/2014 16:52    Micro Results      No results found for this or any previous visit (from the past 240 hour(s)).     Today   Subjective:   Ina Homes today has no headache,no chest abdominal pain,no new weakness tingling or numbness, feels much better wants to go home today.    Objective:   Blood pressure 156/56, pulse 40, temperature 98.8 F (37.1 C), temperature source Oral, resp. rate 18, height 5\' 10"  (1.778 m), weight 110.4 kg (243 lb 6.2 oz), SpO2 100 %.   Intake/Output Summary (Last 24 hours) at 06/30/14 1000 Last data filed at 06/30/14 0545  Gross per 24 hour  Intake    360 ml  Output    550 ml  Net   -190 ml    Exam Awake Alert, Oriented x 3, No new F.N deficits, Normal affect [place].AT,PERRAL Supple Neck,No JVD, No cervical lymphadenopathy appriciated.  Symmetrical Chest  wall movement, Good air movement bilaterally, CTAB RRR,No Gallops,Rubs or new Murmurs, No Parasternal Heave +ve B.Sounds, Abd Soft, Non tender, No organomegaly appriciated, No rebound -guarding or rigidity. No Cyanosis, Clubbing or edema, No new Rash or bruise  Data Review   CBC w Diff: Lab Results  Component Value Date   WBC 6.9 06/29/2014   HGB 12.0* 06/29/2014   HCT 36.4* 06/29/2014   PLT 263 06/29/2014    CMP: Lab Results    Component Value Date   NA 138 06/29/2014   K 3.4* 06/29/2014   CL 98 06/29/2014   CO2 29 06/29/2014   BUN 16 06/29/2014   CREATININE 1.38* 06/29/2014  .   Total Time in preparing paper work, data evaluation and todays exam - 35 minutes  Leroy Sea M.D on 06/30/2014 at 10:00 AM  [facility]   Office  [phone]

## 2014-06-30 NOTE — Progress Notes (Signed)
TELEMETRY: Reviewed telemetry pt in NSR with 2:1 AV block and HR 40 bpm. No pauses.: Filed Vitals:   06/29/14 2130 06/29/14 2230 06/30/14 0523 06/30/14 0623  BP: 165/41 150/48 163/55 156/56  Pulse: 41 40  40  Temp:   98.8 F (37.1 C)   TempSrc:   Oral   Resp:   18   Height:   5\' 10"  (1.778 m)   Weight:   243 lb 6.2 oz (110.4 kg)   SpO2:   100%     Intake/Output Summary (Last 24 hours) at 06/30/14 0804 Last data filed at 06/30/14 0545  Gross per 24 hour  Intake    360 ml  Output    550 ml  Net   -190 ml   Filed Weights   06/29/14 1717 06/30/14 0523  Weight: 270 lb (122.471 kg) 243 lb 6.2 oz (110.4 kg)    Subjective Feels much better with treatment of PNA. Less cough. Energy is much better. Denies dyspnea or chest pain. No dizziness, fatigue, or syncope with ambulation.  Marland Kitchen. aspirin EC  81 mg Oral Daily  . azithromycin  500 mg Intravenous Q24H  . cefTRIAXone (ROCEPHIN)  IV  1 g Intravenous Q24H  . enoxaparin (LOVENOX) injection  30 mg Subcutaneous Q24H  . ferrous sulfate  325 mg Oral Q breakfast  . hydrALAZINE  100 mg Oral 3 times per day  . hydrochlorothiazide  50 mg Oral Daily  . insulin aspart  0-5 Units Subcutaneous QHS  . insulin aspart  0-9 Units Subcutaneous TID WC  . insulin glargine  25 Units Subcutaneous QHS  . isosorbide mononitrate  60 mg Oral Daily  . lisinopril  40 mg Oral Daily  . metFORMIN  1,000 mg Oral BID WC  . multivitamin with minerals  1 tablet Oral Daily  . simvastatin  10 mg Oral QHS  . sodium chloride  3 mL Intravenous Q12H   . sodium chloride 75 mL/hr at 06/29/14 2030    LABS: Basic Metabolic Panel:  Recent Labs  40/98/1103/27/16 1624  NA 138  K 3.4*  CL 98  CO2 29  GLUCOSE 234*  BUN 16  CREATININE 1.38*  CALCIUM 9.8  MG 1.5   Liver Function Tests: No results for input(s): AST, ALT, ALKPHOS, BILITOT, PROT, ALBUMIN in the last 72 hours. No results for input(s): LIPASE, AMYLASE in the last 72 hours. CBC:  Recent Labs   06/29/14 1624  WBC 6.9  HGB 12.0*  HCT 36.4*  MCV 87.5  PLT 263   Cardiac Enzymes:  Recent Labs  06/29/14 2045 06/30/14 0218  TROPONINI 0.03 <0.03   BNP: No results for input(s): PROBNP in the last 72 hours. D-Dimer: No results for input(s): DDIMER in the last 72 hours. Hemoglobin A1C: No results for input(s): HGBA1C in the last 72 hours. Fasting Lipid Panel: No results for input(s): CHOL, HDL, LDLCALC, TRIG, CHOLHDL, LDLDIRECT in the last 72 hours. Thyroid Function Tests:  Recent Labs  06/29/14 2045  TSH 2.132     Radiology/Studies:  Dg Chest 2 View  06/29/2014   CLINICAL DATA:  74 year old male with cough and congestion for 2 days. Initial encounter.  EXAM: CHEST  2 VIEW  COMPARISON:  07/02/2009.  FINDINGS: Mild cardiomegaly has not significantly changed. Stable lung volumes. Other mediastinal contours are within normal limits. Visualized tracheal air column is within normal limits. No pneumothorax, pulmonary edema, pleural effusion or consolidation. There is asymmetric vague increased opacity in the right upper lobe on only  the frontal view. No acute osseous abnormality identified.  IMPRESSION: Vague asymmetric increased right upper lobe opacity on the frontal view suspicious for bronchopneumonia in this setting. Post treatment radiographs recommended to document resolution.   Electronically Signed   By: Odessa Fleming M.D.   On: 06/29/2014 16:52    PHYSICAL EXAM General: Well developed, well nourished, in no acute distress. Head: Normocephalic, atraumatic, sclera non-icteric, oropharynx is clear Neck: Negative for carotid bruits. JVD not elevated. No adenopathy Lungs: Clear bilaterally to auscultation without wheezes, rales, or rhonchi. Breathing is unlabored. Heart: RRR S1 S2 without murmurs, rubs, or gallops.  Abdomen: Soft, non-tender, non-distended with normoactive bowel sounds. No hepatomegaly. No rebound/guarding. No obvious abdominal masses. Msk:  Strength and tone  appears normal for age. Extremities: No clubbing, cyanosis or edema.  Distal pedal pulses are 2+ and equal bilaterally. Neuro: Alert and oriented X 3. Moves all extremities spontaneously. Psych:  Responds to questions appropriately with a normal affect.  ASSESSMENT AND PLAN: 1. 2:1 AV block- chronic. HR 40 bpm. Narrow complex QRS. Patient reports evaluation at [facility] by EP- no pacemaker indicated at this time. 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 He is on no rate slowing drugs. At this time no indication for PPM. He will follow up at the [facility]. We will sign off. Please call with questions.   Present on Admission:  . CAP (community acquired pneumonia) . Pneumonia  Signed, Peter [place], MDFACC 06/30/2014 8:04 AM

## 2014-07-01 LAB — HEMOGLOBIN A1C
Hgb A1c MFr Bld: 8 % — ABNORMAL HIGH (ref 4.8–5.6)
Mean Plasma Glucose: 183 mg/dL

## 2016-04-16 ENCOUNTER — Encounter (HOSPITAL_COMMUNITY): Payer: Self-pay | Admitting: Emergency Medicine

## 2016-04-16 DIAGNOSIS — R112 Nausea with vomiting, unspecified: Secondary | ICD-10-CM | POA: Diagnosis not present

## 2016-04-16 DIAGNOSIS — Z794 Long term (current) use of insulin: Secondary | ICD-10-CM | POA: Diagnosis not present

## 2016-04-16 DIAGNOSIS — R140 Abdominal distension (gaseous): Secondary | ICD-10-CM | POA: Insufficient documentation

## 2016-04-16 DIAGNOSIS — E119 Type 2 diabetes mellitus without complications: Secondary | ICD-10-CM | POA: Insufficient documentation

## 2016-04-16 DIAGNOSIS — Z7982 Long term (current) use of aspirin: Secondary | ICD-10-CM | POA: Diagnosis not present

## 2016-04-16 DIAGNOSIS — R1013 Epigastric pain: Secondary | ICD-10-CM | POA: Diagnosis not present

## 2016-04-16 DIAGNOSIS — I10 Essential (primary) hypertension: Secondary | ICD-10-CM | POA: Insufficient documentation

## 2016-04-16 DIAGNOSIS — I2510 Atherosclerotic heart disease of native coronary artery without angina pectoris: Secondary | ICD-10-CM | POA: Insufficient documentation

## 2016-04-16 LAB — COMPREHENSIVE METABOLIC PANEL
ALBUMIN: 3.9 g/dL (ref 3.5–5.0)
ALK PHOS: 43 U/L (ref 38–126)
ALT: 14 U/L — ABNORMAL LOW (ref 17–63)
ANION GAP: 9 (ref 5–15)
AST: 16 U/L (ref 15–41)
BILIRUBIN TOTAL: 0.8 mg/dL (ref 0.3–1.2)
BUN: 17 mg/dL (ref 6–20)
CO2: 27 mmol/L (ref 22–32)
Calcium: 9.6 mg/dL (ref 8.9–10.3)
Chloride: 103 mmol/L (ref 101–111)
Creatinine, Ser: 1.27 mg/dL — ABNORMAL HIGH (ref 0.61–1.24)
GFR calc Af Amer: >60 mL/min (ref >60)
GFR calc non Af Amer: 54 mL/min — ABNORMAL LOW (ref >60)
GLUCOSE: 313 mg/dL — AB (ref 65–99)
POTASSIUM: 4.1 mmol/L (ref 3.5–5.1)
Sodium: 139 mmol/L (ref 135–145)
Total Protein: 7 g/dL (ref 6.5–8.1)

## 2016-04-16 LAB — URINALYSIS, ROUTINE W REFLEX MICROSCOPIC
BACTERIA UA: NONE SEEN
BILIRUBIN URINE: NEGATIVE
Glucose, UA: >=500 mg/dL — AB
Hgb urine dipstick: NEGATIVE
Ketones, ur: NEGATIVE mg/dL
Leukocytes, UA: NEGATIVE
NITRITE: NEGATIVE
PH: 7 (ref 5.0–8.0)
Protein, ur: 100 mg/dL — AB
Specific Gravity, Urine: 1.02 (ref 1.005–1.030)

## 2016-04-16 LAB — LIPASE, BLOOD: Lipase: 38 U/L (ref 11–51)

## 2016-04-16 LAB — CBC
HEMATOCRIT: 38.3 % — AB (ref 39.0–52.0)
HEMOGLOBIN: 12.3 g/dL — AB (ref 13.0–17.0)
MCH: 28.5 pg (ref 26.0–34.0)
MCHC: 32.1 g/dL (ref 30.0–36.0)
MCV: 88.7 fL (ref 78.0–100.0)
Platelets: 275 10*3/uL (ref 150–400)
RBC: 4.32 MIL/uL (ref 4.22–5.81)
RDW: 13.6 % (ref 11.5–15.5)
WBC: 6.6 10*3/uL (ref 4.0–10.5)

## 2016-04-16 MED ORDER — ONDANSETRON 4 MG PO TBDP
4.0000 mg | ORAL_TABLET | Freq: Once | ORAL | Status: AC | PRN
  Administered 2016-04-16: 4 mg via ORAL

## 2016-04-16 MED ORDER — ONDANSETRON 4 MG PO TBDP
ORAL_TABLET | ORAL | Status: AC
  Filled 2016-04-16: qty 1

## 2016-04-16 NOTE — ED Triage Notes (Signed)
Patient with abdominal pain and vomiting.  He states that he ate at a Mediterranean seafood meal about 5pm and now his is having abdominal pain, vomiting and little dizziness after the vomiting.  He stated that he has taken some Pepto Bismol without any relief.  Patient last vomited about 30 mins prior to arrival.

## 2016-04-17 ENCOUNTER — Emergency Department (HOSPITAL_COMMUNITY)
Admission: EM | Admit: 2016-04-17 | Discharge: 2016-04-17 | Disposition: A | Discharge status: Home / Self Care | Payer: Medicare Other | Attending: Emergency Medicine | Admitting: Emergency Medicine

## 2016-04-17 DIAGNOSIS — R112 Nausea with vomiting, unspecified: Secondary | ICD-10-CM

## 2016-04-17 MED ORDER — ONDANSETRON 4 MG PO TBDP: 4.0000 mg | ORAL_TABLET | Freq: Three times a day (TID) | ORAL | 0 refills | Status: DC | PRN

## 2016-04-17 NOTE — ED Provider Notes (Signed)
[facility]EMERGENCY DEPT Provider Note   CSN: [id number] Arrival date & time: 04/16/16  2230  By signing my name below, I, Linna Darner, attest that this documentation has been prepared under the direction and in the presence of physician practitioner, Melene Plan, DO. Electronically Signed: Linna Darner, Scribe. 04/17/2016. 12:39 AM.  History   Chief Complaint Chief Complaint  Patient presents with  . Abdominal Pain  . Emesis    The history is provided by the patient and the spouse. No language interpreter was used.  Abdominal Pain   This is a new problem. The current episode started 6 to 12 hours ago. The problem occurs constantly. The problem has been rapidly improving. The pain is associated with eating. The pain is located in the generalized abdominal region. The pain is severe. Associated symptoms include nausea and vomiting. Pertinent negatives include fever and diarrhea. Nothing aggravates the symptoms. Nothing relieves the symptoms. Past workup does not include surgery.  Emesis   This is a new problem. The current episode started 6 to 12 hours ago. The problem occurs continuously. The problem has been rapidly improving. The emesis has an appearance of stomach contents. There has been no fever. Associated symptoms include abdominal pain. Pertinent negatives include no chills, no diarrhea and no fever. Risk factors include suspect food intake.     HPI Comments: Matthew Sellers is a 76 y.o. male who presents to the Emergency Department complaining of sudden onset, constant, improving, abdominal cramping beginning several hours ago. He reports associated abdominal distention. Patient states he ate mediterranean seafood around 5 PM on 04/16/16 and his abdominal cramping and distention began shortly after he arrived home. He states that ~ 30 minutes after his pain presented, he started vomiting persistently for about one hour. He tried [employer] shortly after his symptoms began with no  improvement. He notes several family members ate the same meal but none of them experienced similar symptoms. He notes his pain, distention, and vomiting have resolved completely but he wanted to be evaluated out of caution. No h/o abdominal surgery. He denies diarrhea, fever, chills, or any other associated symptoms.  Past Medical History:  Diagnosis Date  . Anemia   . Bradycardia   . CAD (coronary artery disease)   . Chest tightness   . Diabetes mellitus without complication (HCC)   . Dyslipidemia   . Dyspnea on exertion   . Hypertension   . Obesity   . Pneumonia     Patient Active Problem List   Diagnosis Date Noted  . CAP (community acquired pneumonia) 06/29/2014  . Diabetes (HCC) 06/29/2014  . Hypertension 06/29/2014  . Hyperlipidemia 06/29/2014  . Hypokalemia 06/29/2014  . Anemia 06/29/2014  . Pneumonia 06/29/2014    Past Surgical History:  Procedure Laterality Date  . ACHILLES TENDON SURGERY     for rupture  . CARDIAC CATHETERIZATION  07/08/2009   mild to mod . prox LAD 50% (Dr. Garen Lah)  . DOPPLER ECHOCARDIOGRAPHY  04/10/2008   LVEF 70-80% ,norm  . stress myoview  04/09/2008   EF 54%; LV  norm       Home Medications    Prior to Admission medications   Medication Sig Start Date End Date Taking? Authorizing Provider  aspirin EC 81 MG tablet Take 81 mg by mouth daily.    Historical Provider, MD  ferrous sulfate 325 (65 FE) MG tablet Take 325 mg by mouth daily with breakfast.    Historical Provider, MD  hydrALAZINE (APRESOLINE) 100 MG tablet  Take 100 mg by mouth 3 (three) times daily.    Historical Provider, MD  insulin glargine (LANTUS) 100 UNIT/ML injection Inject 25 Units into the skin at bedtime.    Historical Provider, MD  isosorbide mononitrate (IMDUR) 60 MG 24 hr tablet Take 60 mg by mouth daily.    Historical Provider, MD  Boris Lown Oil 300 MG CAPS Take 300 mg by mouth daily.    Historical Provider, MD  levofloxacin (LEVAQUIN) 750 MG tablet Take 1 tablet  (750 mg total) by mouth daily. 06/30/14   Leroy Sea, MD  lisinopril-hydrochlorothiazide (PRINZIDE,ZESTORETIC) 20-25 MG per tablet Take 2 tablets by mouth daily.    Historical Provider, MD  metFORMIN (GLUCOPHAGE) 1000 MG tablet Take 1,000 mg by mouth 2 (two) times daily with a meal.    Historical Provider, MD  Multiple Vitamin (MULTIVITAMIN WITH MINERALS) TABS tablet Take 1 tablet by mouth daily.    Historical Provider, MD  nitroGLYCERIN (NITROSTAT) 0.4 MG SL tablet Place 0.4 mg under the tongue every 5 (five) minutes as needed for chest pain.    Historical Provider, MD  ondansetron (ZOFRAN ODT) 4 MG disintegrating tablet Take 1 tablet (4 mg total) by mouth every 8 (eight) hours as needed for nausea or vomiting. 04/17/16   Melene Plan, DO  simvastatin (ZOCOR) 20 MG tablet Take 10 mg by mouth at bedtime. Take 1/2 tablet  At bedtime    Historical Provider, MD    Family History Family History  Problem Relation Age of Onset  . Leukemia Mother     Social History Social History  Substance Use Topics  . Smoking status: Never Smoker  . Smokeless tobacco: Never Used  . Alcohol use No     Allergies   Patient has no known allergies.   Review of Systems Review of Systems  Constitutional: Negative for chills and fever.  Gastrointestinal: Positive for abdominal distention, abdominal pain, nausea and vomiting. Negative for diarrhea.  All other systems reviewed and are negative.    Physical Exam Updated Vital Signs BP 182/57 (BP Location: Left Arm)   Pulse 67   Temp 98.5 F (36.9 C) (Oral)   Resp 20   SpO2 99%   Physical Exam  Constitutional: He is oriented to person, place, and time. He appears well-developed and well-nourished.  HENT:  Head: Normocephalic and atraumatic.  Eyes: EOM are normal. Pupils are equal, round, and reactive to light.  Neck: Normal range of motion. Neck supple. No JVD present.  Cardiovascular: Normal rate and regular rhythm.  Exam reveals no gallop and no  friction rub.   No murmur heard. Pulmonary/Chest: No respiratory distress. He has no wheezes.  Abdominal: He exhibits distension. There is no tenderness. There is no rebound and no guarding.  Musculoskeletal: Normal range of motion.  Neurological: He is alert and oriented to person, place, and time.  Skin: No rash noted. No pallor.  Psychiatric: He has a normal mood and affect. His behavior is normal.  Nursing note and vitals reviewed.    ED Treatments / Results  Labs (all labs ordered are listed, but only abnormal results are displayed) Labs Reviewed  COMPREHENSIVE METABOLIC PANEL - Abnormal; Notable for the following:       Result Value   Glucose, Bld 313 (*)    Creatinine, Ser 1.27 (*)    ALT 14 (*)    GFR calc non Af Amer 54 (*)    All other components within normal limits  CBC - Abnormal; Notable for the  following:    Hemoglobin 12.3 (*)    HCT 38.3 (*)    All other components within normal limits  URINALYSIS, ROUTINE W REFLEX MICROSCOPIC - Abnormal; Notable for the following:    Glucose, UA >=500 (*)    Protein, ur 100 (*)    Squamous Epithelial / LPF 0-5 (*)    All other components within normal limits  LIPASE, BLOOD    EKG  EKG Interpretation None       Radiology No results found.  Procedures Procedures (including critical care time)  DIAGNOSTIC STUDIES: Oxygen Saturation is 100% on RA, normal by my interpretation.    COORDINATION OF CARE: 12:45 AM Discussed treatment plan with pt at bedside and pt agreed to plan.  Medications Ordered in ED Medications  ondansetron (ZOFRAN-ODT) disintegrating tablet 4 mg (4 mg Oral Given 04/16/16 2250)     Initial Impression / Assessment and Plan / ED Course  I have reviewed the triage vital signs and the nursing notes.  Pertinent labs & imaging results that were available during my care of the patient were reviewed by me and considered in my medical decision making (see chart for details).  Clinical Course      76 yo M With a chief complaint of epigastric abdominal pain and vomiting. This occurred after he needed a new restaurant. His family had the exact same dishes him and nobody else has the same symptoms. He was given Zofran in triage and feels completely better. There is no abdominal tenderness on his exam. He is tolerating by mouth without difficulty. Sent home with Zofran. :  I have discussed the diagnosis/risks/treatment options with the patient and family and believe the pt to be eligible for discharge home to follow-up with PCP. We also discussed returning to the ED immediately if new or worsening sx occur. We discussed the sx which are most concerning (e.g., sudden worsening pain, fever, inability to tolerate by mouth) that necessitate immediate return. Medications administered to the patient during their visit and any new prescriptions provided to the patient are listed below.  Medications given during this visit Medications  ondansetron (ZOFRAN-ODT) disintegrating tablet 4 mg (4 mg Oral Given 04/16/16 2250)     The patient appears reasonably screen and/or stabilized for discharge and I doubt any other medical condition or other [facility] requiring further screening, evaluation, or treatment in the ED at this time prior to discharge.    Final Clinical Impressions(s) / ED Diagnoses   Final diagnoses:  Nausea and vomiting, intractability of vomiting not specified, unspecified vomiting type    New Prescriptions Discharge Medication List as of 04/17/2016 12:48 AM    START taking these medications   Details  ondansetron (ZOFRAN ODT) 4 MG disintegrating tablet Take 1 tablet (4 mg total) by mouth every 8 (eight) hours as needed for nausea or vomiting., Starting Sun 04/17/2016, Print        I personally performed the services described in this documentation, which was scribed in my presence. The recorded information has been reviewed and is accurate.    Melene Planan Mieshia Pepitone, DO 04/17/16 [phone]

## 2016-08-30 ENCOUNTER — Emergency Department (HOSPITAL_COMMUNITY): Payer: Medicare Other

## 2016-08-30 ENCOUNTER — Emergency Department (HOSPITAL_COMMUNITY)
Admission: EM | Admit: 2016-08-30 | Discharge: 2016-08-30 | Disposition: A | Discharge status: Home / Self Care | Payer: Medicare Other | Attending: Emergency Medicine | Admitting: Emergency Medicine

## 2016-08-30 DIAGNOSIS — J189 Pneumonia, unspecified organism: Secondary | ICD-10-CM

## 2016-08-30 DIAGNOSIS — R030 Elevated blood-pressure reading, without diagnosis of hypertension: Secondary | ICD-10-CM | POA: Diagnosis not present

## 2016-08-30 DIAGNOSIS — D649 Anemia, unspecified: Secondary | ICD-10-CM | POA: Diagnosis not present

## 2016-08-30 DIAGNOSIS — I10 Essential (primary) hypertension: Secondary | ICD-10-CM | POA: Diagnosis not present

## 2016-08-30 DIAGNOSIS — N289 Disorder of kidney and ureter, unspecified: Secondary | ICD-10-CM | POA: Insufficient documentation

## 2016-08-30 DIAGNOSIS — R600 Localized edema: Secondary | ICD-10-CM | POA: Diagnosis not present

## 2016-08-30 DIAGNOSIS — R002 Palpitations: Secondary | ICD-10-CM | POA: Diagnosis not present

## 2016-08-30 DIAGNOSIS — J181 Lobar pneumonia, unspecified organism: Secondary | ICD-10-CM | POA: Insufficient documentation

## 2016-08-30 DIAGNOSIS — Z794 Long term (current) use of insulin: Secondary | ICD-10-CM | POA: Insufficient documentation

## 2016-08-30 DIAGNOSIS — E876 Hypokalemia: Secondary | ICD-10-CM | POA: Diagnosis not present

## 2016-08-30 DIAGNOSIS — E119 Type 2 diabetes mellitus without complications: Secondary | ICD-10-CM | POA: Insufficient documentation

## 2016-08-30 DIAGNOSIS — Z7982 Long term (current) use of aspirin: Secondary | ICD-10-CM | POA: Diagnosis not present

## 2016-08-30 DIAGNOSIS — I2510 Atherosclerotic heart disease of native coronary artery without angina pectoris: Secondary | ICD-10-CM | POA: Insufficient documentation

## 2016-08-30 DIAGNOSIS — R509 Fever, unspecified: Secondary | ICD-10-CM | POA: Diagnosis present

## 2016-08-30 LAB — DIFFERENTIAL
BASOS ABS: 0 10*3/uL (ref 0.0–0.1)
BASOS PCT: 0 %
Eosinophils Absolute: 0 10*3/uL (ref 0.0–0.7)
Eosinophils Relative: 0 %
Lymphocytes Relative: 10 %
Lymphs Abs: 0.9 10*3/uL (ref 0.7–4.0)
MONOS PCT: 4 %
Monocytes Absolute: 0.3 10*3/uL (ref 0.1–1.0)
NEUTROS ABS: 7.2 10*3/uL (ref 1.7–7.7)
Neutrophils Relative %: 86 %

## 2016-08-30 LAB — BASIC METABOLIC PANEL
Anion gap: 10 (ref 5–15)
BUN: 23 mg/dL — AB (ref 6–20)
CHLORIDE: 104 mmol/L (ref 101–111)
CO2: 23 mmol/L (ref 22–32)
Calcium: 9.2 mg/dL (ref 8.9–10.3)
Creatinine, Ser: 1.46 mg/dL — ABNORMAL HIGH (ref 0.61–1.24)
GFR calc Af Amer: 53 mL/min — ABNORMAL LOW (ref >60)
GFR calc non Af Amer: 46 mL/min — ABNORMAL LOW (ref >60)
GLUCOSE: 245 mg/dL — AB (ref 65–99)
POTASSIUM: 3.1 mmol/L — AB (ref 3.5–5.1)
Sodium: 137 mmol/L (ref 135–145)

## 2016-08-30 LAB — URINALYSIS, ROUTINE W REFLEX MICROSCOPIC
BACTERIA UA: NONE SEEN
Bilirubin Urine: NEGATIVE
Glucose, UA: 150 mg/dL — AB
Hgb urine dipstick: NEGATIVE
Ketones, ur: 5 mg/dL — AB
Leukocytes, UA: NEGATIVE
NITRITE: NEGATIVE
PH: 5 (ref 5.0–8.0)
Protein, ur: 30 mg/dL — AB
SPECIFIC GRAVITY, URINE: 1.024 (ref 1.005–1.030)

## 2016-08-30 LAB — I-STAT TROPONIN, ED: Troponin i, poc: 0.01 ng/mL (ref 0.00–0.08)

## 2016-08-30 LAB — CBC
HEMATOCRIT: 38.1 % — AB (ref 39.0–52.0)
Hemoglobin: 12.1 g/dL — ABNORMAL LOW (ref 13.0–17.0)
MCH: 27.8 pg (ref 26.0–34.0)
MCHC: 31.8 g/dL (ref 30.0–36.0)
MCV: 87.4 fL (ref 78.0–100.0)
Platelets: 233 10*3/uL (ref 150–400)
RBC: 4.36 MIL/uL (ref 4.22–5.81)
RDW: 13.8 % (ref 11.5–15.5)
WBC: 8.4 10*3/uL (ref 4.0–10.5)

## 2016-08-30 LAB — MAGNESIUM: MAGNESIUM: 1.3 mg/dL — AB (ref 1.7–2.4)

## 2016-08-30 MED ORDER — DEXTROSE 5 % IV SOLN
500.0000 mg | Freq: Once | INTRAVENOUS | Status: AC
  Administered 2016-08-30: 500 mg via INTRAVENOUS
  Filled 2016-08-30: qty 500

## 2016-08-30 MED ORDER — POTASSIUM CHLORIDE CRYS ER 20 MEQ PO TBCR: 20.0000 meq | EXTENDED_RELEASE_TABLET | Freq: Every day | ORAL | 0 refills | Status: DC

## 2016-08-30 MED ORDER — MAGNESIUM SULFATE 2 GM/50ML IV SOLN
2.0000 g | Freq: Once | INTRAVENOUS | Status: AC
  Administered 2016-08-30: 2 g via INTRAVENOUS
  Filled 2016-08-30: qty 50

## 2016-08-30 MED ORDER — DEXTROSE 5 % IV SOLN
1.0000 g | Freq: Once | INTRAVENOUS | Status: AC
  Administered 2016-08-30: 1 g via INTRAVENOUS
  Filled 2016-08-30: qty 10

## 2016-08-30 MED ORDER — ACETAMINOPHEN 325 MG PO TABS
650.0000 mg | ORAL_TABLET | Freq: Once | ORAL | Status: AC
  Administered 2016-08-30: 650 mg via ORAL
  Filled 2016-08-30: qty 2

## 2016-08-30 MED ORDER — AMOXICILLIN 500 MG PO CAPS: 1000.0000 mg | ORAL_CAPSULE | Freq: Two times a day (BID) | ORAL | 0 refills | Status: DC

## 2016-08-30 MED ORDER — POTASSIUM CHLORIDE CRYS ER 20 MEQ PO TBCR
40.0000 meq | EXTENDED_RELEASE_TABLET | Freq: Once | ORAL | Status: AC
  Administered 2016-08-30: 40 meq via ORAL
  Filled 2016-08-30: qty 2

## 2016-08-30 MED ORDER — ONDANSETRON HCL 4 MG/2ML IJ SOLN
4.0000 mg | Freq: Once | INTRAMUSCULAR | Status: AC
  Administered 2016-08-30: 4 mg via INTRAVENOUS
  Filled 2016-08-30: qty 2

## 2016-08-30 NOTE — ED Provider Notes (Signed)
[facility]PT Provider Note   CSN: [id number] Arrival date & time: 08/30/16  0121   By signing my name below, I, Clarisse Gouge, attest that this documentation has been prepared under the direction and in the presence of Dione Booze, MD. Electronically signed, Clarisse Gouge, ED Scribe. 08/30/16. 1:56 AM.   History   Chief Complaint Chief Complaint  Patient presents with  . Palpitations   The history is provided by the patient and medical records. No language interpreter was used.    Matthew Sellers is a 76 y.o. male with h/o DM and HTN, BIB EMS who presents to the Emergency Department with concern for subjective fevers this evening s/p a drive from [place]. He notes "heavy joints" sensation in the knees and ankles, and he adds occasional sharp pain in the toes while walking. Pt reports palpitations that subsided PTA, chills mildly relieved by coffee, vomiting x 1 which mildly relieved his symptoms, rhinorrhea and cough. Some leg swelling noted. Upcoming appointment with PCP allegedly 6 days from today. No sick contacts. No nausea currently. No chest pain or tightness, SOB, constipation, diarrhea or any other complaints noted at this time.   Past Medical History:  Diagnosis Date  . Anemia   . Bradycardia   . CAD (coronary artery disease)   . Chest tightness   . Diabetes mellitus without complication (HCC)   . Dyslipidemia   . Dyspnea on exertion   . Hypertension   . Obesity   . Pneumonia     Patient Active Problem List   Diagnosis Date Noted  . CAP (community acquired pneumonia) 06/29/2014  . Diabetes (HCC) 06/29/2014  . Hypertension 06/29/2014  . Hyperlipidemia 06/29/2014  . Hypokalemia 06/29/2014  . Anemia 06/29/2014  . Pneumonia 06/29/2014    Past Surgical History:  Procedure Laterality Date  . ACHILLES TENDON SURGERY     for rupture  . CARDIAC CATHETERIZATION  07/08/2009   mild to mod . prox LAD 50% (Dr. Garen Lah)  . DOPPLER ECHOCARDIOGRAPHY   04/10/2008   LVEF 70-80% ,norm  . stress myoview  04/09/2008   EF 54%; LV  norm       Home Medications    Prior to Admission medications   Medication Sig Start Date End Date Taking? Authorizing Provider  aspirin EC 81 MG tablet Take 81 mg by mouth daily.    [provider]  ferrous sulfate 325 (65 FE) MG tablet Take 325 mg by mouth daily with breakfast.    [provider]  hydrALAZINE (APRESOLINE) 100 MG tablet Take 100 mg by mouth 3 (three) times daily.    [provider]  insulin glargine (LANTUS) 100 UNIT/ML injection Inject 25 Units into the skin at bedtime.    [provider]  isosorbide mononitrate (IMDUR) 60 MG 24 hr tablet Take 60 mg by mouth daily.    [provider]  Boris Lown Oil 300 MG CAPS Take 300 mg by mouth daily.    [provider]  levofloxacin (LEVAQUIN) 750 MG tablet Take 1 tablet (750 mg total) by mouth daily. 06/30/14   Leroy Sea, MD  lisinopril-hydrochlorothiazide (PRINZIDE,ZESTORETIC) 20-25 MG per tablet Take 2 tablets by mouth daily.    [provider]  metFORMIN (GLUCOPHAGE) 1000 MG tablet Take 1,000 mg by mouth 2 (two) times daily with a meal.    [provider]  Multiple Vitamin (MULTIVITAMIN WITH MINERALS) TABS tablet Take 1 tablet by mouth daily.    [provider]  nitroGLYCERIN (NITROSTAT)  0.4 MG SL tablet Place 0.4 mg under the tongue every 5 (five) minutes as needed for chest pain.    [provider]  ondansetron (ZOFRAN ODT) 4 MG disintegrating tablet Take 1 tablet (4 mg total) by mouth every 8 (eight) hours as needed for nausea or vomiting. 04/17/16   Melene PlanFloyd, Dan, DO  simvastatin (ZOCOR) 20 MG tablet Take 10 mg by mouth at bedtime. Take 1/2 tablet  At bedtime    [provider]    Family History Family History  Problem Relation Age of Onset  . Leukemia Mother     Social History Social History  Substance Use Topics  . Smoking status: Never Smoker    . Smokeless tobacco: Never Used  . Alcohol use No     Allergies   Patient has no known allergies.   Review of Systems Review of Systems  Constitutional: Positive for chills.  HENT: Positive for rhinorrhea.   Respiratory: Positive for cough. Negative for chest tightness and shortness of breath.   Cardiovascular: Positive for palpitations and leg swelling. Negative for chest pain.  Gastrointestinal: Positive for vomiting. Negative for constipation and diarrhea.  Musculoskeletal: Positive for arthralgias.  All other systems reviewed and are negative.    Physical Exam Updated Vital Signs BP (!) 160/39   Pulse (!) 48   Temp (!) 101.3 F (38.5 C) (Oral)   Resp (!) 21   Ht 5\' 10"  (1.778 m)   Wt 185 lb (83.9 kg)   SpO2 97%   BMI 26.54 kg/m   Physical Exam  Constitutional: He is oriented to person, place, and time. He appears well-developed and well-nourished.  HENT:  Head: Normocephalic and atraumatic.  Eyes: EOM are normal. Pupils are equal, round, and reactive to light.  Neck: Normal range of motion. Neck supple. No JVD present.  Cardiovascular: Normal rate, regular rhythm and normal heart sounds.   No murmur heard. Pulmonary/Chest: Effort normal and breath sounds normal. He has no wheezes. He has no rales. He exhibits no tenderness.  Abdominal: Soft. Bowel sounds are normal. He exhibits no distension and no mass. There is no tenderness.  Musculoskeletal: Normal range of motion. He exhibits edema (trace pretibial).  Lymphadenopathy:    He has no cervical adenopathy.  Neurological: He is alert and oriented to person, place, and time. No cranial nerve deficit. He exhibits normal muscle tone. Coordination normal.  Skin: Skin is warm and dry. No rash noted.  Psychiatric: He has a normal mood and affect. His behavior is normal. Judgment and thought content normal.  Nursing note and vitals reviewed.    ED Treatments / Results  DIAGNOSTIC STUDIES: Oxygen Saturation is 97%  on RA, NL by my interpretation.    COORDINATION OF CARE: 1:39 AM-Discussed next steps with pt. Pt verbalized understanding and is agreeable with the plan. Plan includes IV fluids, labs, chest x-tray, ECG.   Labs (all labs ordered are listed, but only abnormal results are displayed) Labs Reviewed  BASIC METABOLIC PANEL - Abnormal; Notable for the following:       Result Value   Potassium 3.1 (*)    Glucose, Bld 245 (*)    BUN 23 (*)    Creatinine, Ser 1.46 (*)    GFR calc non Af Amer 46 (*)    GFR calc Af Amer 53 (*)    All other components within normal limits  CBC - Abnormal; Notable for the following:    Hemoglobin 12.1 (*)    HCT 38.1 (*)  All other components within normal limits  MAGNESIUM - Abnormal; Notable for the following:    Magnesium 1.3 (*)    All other components within normal limits  URINALYSIS, ROUTINE W REFLEX MICROSCOPIC - Abnormal; Notable for the following:    Glucose, UA 150 (*)    Ketones, ur 5 (*)    Protein, ur 30 (*)    Squamous Epithelial / LPF 0-5 (*)    All other components within normal limits  DIFFERENTIAL  I-STAT TROPOININ, ED    EKG  EKG Interpretation  Date/Time:  Tuesday Aug 30 2016 01:30:20 EDT Ventricular Rate:  48 PR Interval:    QRS Duration: 91 QT Interval:  505 QTC Calculation: 452 R Axis:   45 Text Interpretation:  Sinus rhythm with second degree AV block with 2:1 A-V conduction Prolonged PR interval Probable left atrial enlargement Minimal ST depression, inferior leads When compared with ECG of 06/29/2014, No significant change was found Confirmed by Dione Booze ([id number]) on 08/30/2016 1:39:13 AM       Radiology Dg Chest 2 View  Result Date: 08/30/2016 CLINICAL DATA:  76 year old male with palpitation and fever. EXAM: CHEST  2 VIEW COMPARISON:  Chest radiograph dated 06/29/2014 FINDINGS: Two views of the chest demonstrate an area of increased density at the left lung base with silhouetting of the left hemidiaphragm  concerning for developing infiltrate. Clinical correlation and follow-up to resolution recommended. The right lung is clear. There is no significant pleural effusion. No pneumothorax. Stable mild cardiomegaly. No acute osseous pathology. IMPRESSION: Left lung base density concerning for developing infiltrate. Clinical correlation and follow-up resolution Electronically Signed   By: Elgie Collard M.D.   On: 08/30/2016 02:44    Procedures Procedures (including critical care time)  Medications Ordered in ED Medications  magnesium sulfate IVPB 2 g 50 mL (2 g Intravenous New Bag/Given 08/30/16 0545)  azithromycin (ZITHROMAX) 500 mg in dextrose 5 % 250 mL IVPB (500 mg Intravenous New Bag/Given 08/30/16 0545)  acetaminophen (TYLENOL) tablet 650 mg (650 mg Oral Given 08/30/16 0300)  ondansetron (ZOFRAN) injection 4 mg (4 mg Intravenous Given 08/30/16 0300)  potassium chloride SA (K-DUR,KLOR-CON) CR tablet 40 mEq (40 mEq Oral Given 08/30/16 0545)  cefTRIAXone (ROCEPHIN) 1 g in dextrose 5 % 50 mL IVPB (1 g Intravenous New Bag/Given 08/30/16 0545)     Initial Impression / Assessment and Plan / ED Course  I have reviewed the triage vital signs and the nursing notes.  Pertinent labs & imaging results that were available during my care of the patient were reviewed by me and considered in my medical decision making (see chart for details).  Influenza-like illness. ECG shows a prolonged QT interval, so magnesium is checked. Laboratory workup shows hypokalemia and hypomagnesemia as well as renal insufficiency which is mild. Also, mild anemia. No prior labs for comparison. He is given oral potassium and intravenous magnesium. Chest x-ray shows left lower lobe infiltrate, so started on ceftriaxone and azithromycin. He is nontoxic in appearance and felt to be appropriate for outpatient treatment. He is discharged with prescription for K Dur and amoxicillin. It is noted that he is on a diuretic for hypertension which  is the probable reason for his hypokalemia. Advised PCP will need to monitor her potassium as an outpatient.  Final Clinical Impressions(s) / ED Diagnoses   Final diagnoses:  Community acquired pneumonia of left lower lobe of lung (HCC)  Normochromic normocytic anemia  Renal insufficiency  Hypokalemia  Hypomagnesemia    New Prescriptions  New Prescriptions   AMOXICILLIN (AMOXIL) 500 MG CAPSULE    Take 2 capsules (1,000 mg total) by mouth 2 (two) times daily.   POTASSIUM CHLORIDE SA (K-DUR,KLOR-CON) 20 MEQ TABLET    Take 1 tablet (20 mEq total) by mouth daily. Take one tablet twice a day for five days, then one tablet once a day   I personally performed the services described in this documentation, which was scribed in my presence. The recorded information has been reviewed and is accurate.       Dione Booze, MD 08/30/16 [phone]

## 2016-08-30 NOTE — ED Notes (Signed)
Pt wife #: [phone] call for d/c

## 2016-08-30 NOTE — ED Triage Notes (Signed)
Pt from home via [facility]. C/o palpitations, which resolved prior to arrival. Endorses a little vomiting, fever, cough, and "just feeling like I caught a bug." Denies any SHOB, nausea or any pain @ this time.Hx of diabetes & htn. A&O x4 on arrival.

## 2016-08-30 NOTE — ED Notes (Signed)
ED Provider at bedside. 

## 2016-08-30 NOTE — ED Notes (Signed)
Wife called for d/c

## 2016-08-30 NOTE — Discharge Instructions (Signed)
Drink plenty of fluids. Take acetaminophen as needed for fever or aching. Do not take ibuprofen or naproxen, because they can be bad for your kidneys.  Your potassium was low tonight. That is from your blood pressure medication. You are being given a prescription for potassium, and your primary care physician will have to monitor it.

## 2016-09-05 ENCOUNTER — Ambulatory Visit (INDEPENDENT_AMBULATORY_CARE_PROVIDER_SITE_OTHER): Payer: Medicare Other

## 2016-09-05 ENCOUNTER — Ambulatory Visit (HOSPITAL_COMMUNITY)
Admission: EM | Admit: 2016-09-05 | Discharge: 2016-09-05 | Disposition: A | Discharge status: Home / Self Care | Payer: Medicare Other | Attending: Internal Medicine | Admitting: Internal Medicine

## 2016-09-05 ENCOUNTER — Encounter (HOSPITAL_COMMUNITY): Payer: Self-pay | Admitting: Emergency Medicine

## 2016-09-05 DIAGNOSIS — M25551 Pain in right hip: Secondary | ICD-10-CM

## 2016-09-05 DIAGNOSIS — M7071 Other bursitis of hip, right hip: Secondary | ICD-10-CM | POA: Diagnosis not present

## 2016-09-05 DIAGNOSIS — M25559 Pain in unspecified hip: Secondary | ICD-10-CM

## 2016-09-05 MED ORDER — OXYCODONE-ACETAMINOPHEN 5-325 MG PO TABS: 1.0000 | ORAL_TABLET | Freq: Four times a day (QID) | ORAL | 0 refills | Status: AC | PRN

## 2016-09-05 NOTE — ED Provider Notes (Signed)
[facility]    CSN: [id number] Arrival date & time: 09/05/16  1042     History   Chief Complaint Chief Complaint  Patient presents with  . Back Pain    HPI Matthew Sellers is a 76 y.o. male.   Pt c/o pain in his right hip.  States the pain is above the spine of the hip but deep inside. He wonders if he may have wallet syndrome. Denies numbness or shooting pains down his leg. No loss of B/B continence. Now has pain in opposite leg because he is favoring painful side.       Past Medical History:  Diagnosis Date  . Anemia   . Bradycardia   . CAD (coronary artery disease)   . Chest tightness   . Diabetes mellitus without complication (HCC)   . Dyslipidemia   . Dyspnea on exertion   . Hypertension   . Obesity   . Pneumonia     Patient Active Problem List   Diagnosis Date Noted  . CAP (community acquired pneumonia) 06/29/2014  . Diabetes (HCC) 06/29/2014  . Hypertension 06/29/2014  . Hyperlipidemia 06/29/2014  . Hypokalemia 06/29/2014  . Anemia 06/29/2014  . Pneumonia 06/29/2014    Past Surgical History:  Procedure Laterality Date  . ACHILLES TENDON SURGERY     for rupture  . CARDIAC CATHETERIZATION  07/08/2009   mild to mod . prox LAD 50% (Dr. Garen Lah)  . DOPPLER ECHOCARDIOGRAPHY  04/10/2008   LVEF 70-80% ,norm  . stress myoview  04/09/2008   EF 54%; LV  norm       Home Medications    Prior to Admission medications   Medication Sig Start Date End Date Taking? Authorizing Provider  amoxicillin (AMOXIL) 500 MG capsule Take 2 capsules (1,000 mg total) by mouth 2 (two) times daily. 08/30/16   Dione Booze, MD  aspirin EC 81 MG tablet Take 81 mg by mouth daily.    [provider]  ferrous sulfate 325 (65 FE) MG tablet Take 325 mg by mouth daily with breakfast.    [provider]  hydrALAZINE (APRESOLINE) 100 MG tablet Take 100 mg by mouth 3 (three) times daily.    [provider]  insulin glargine (LANTUS) 100  UNIT/ML injection Inject 25 Units into the skin at bedtime.    [provider]  isosorbide mononitrate (IMDUR) 60 MG 24 hr tablet Take 60 mg by mouth daily.    [provider]  Boris Lown Oil 300 MG CAPS Take 300 mg by mouth daily.    [provider]  levofloxacin (LEVAQUIN) 750 MG tablet Take 1 tablet (750 mg total) by mouth daily. 06/30/14   Leroy Sea, MD  lisinopril-hydrochlorothiazide (PRINZIDE,ZESTORETIC) 20-25 MG per tablet Take 2 tablets by mouth daily.    [provider]  metFORMIN (GLUCOPHAGE) 1000 MG tablet Take 1,000 mg by mouth 2 (two) times daily with a meal.    [provider]  Multiple Vitamin (MULTIVITAMIN WITH MINERALS) TABS tablet Take 1 tablet by mouth daily.    [provider]  nitroGLYCERIN (NITROSTAT) 0.4 MG SL tablet Place 0.4 mg under the tongue every 5 (five) minutes as needed for chest pain.    [provider]  ondansetron (ZOFRAN ODT) 4 MG disintegrating tablet Take 1 tablet (4 mg total) by mouth every 8 (eight) hours as needed for nausea or vomiting. 04/17/16   Melene Plan, DO  oxyCODONE-acetaminophen (ROXICET) 5-325 MG tablet Take 1 tablet by mouth every 6 (  six) hours as needed for severe pain. 09/05/16 09/10/16  Arnaldo Natal, MD  potassium chloride SA (K-DUR,KLOR-CON) 20 MEQ tablet Take 1 tablet (20 mEq total) by mouth daily. Take one tablet twice a day for five days, then one tablet once a day 08/30/16   Dione Booze, MD  simvastatin (ZOCOR) 20 MG tablet Take 10 mg by mouth at bedtime. Take 1/2 tablet  At bedtime    [provider]    Family History Family History  Problem Relation Age of Onset  . Leukemia Mother     Social History Social History  Substance Use Topics  . Smoking status: Never Smoker  . Smokeless tobacco: Never Used  . Alcohol use No     Allergies   Patient has no known allergies.   Review of Systems Review of Systems  Constitutional: Negative for chills and  fever.  HENT: Negative for sore throat and tinnitus.   Eyes: Negative for redness.  Respiratory: Negative for cough and shortness of breath.   Cardiovascular: Negative for chest pain and palpitations.  Gastrointestinal: Negative for abdominal pain, diarrhea, nausea and vomiting.  Genitourinary: Negative for dysuria, frequency and urgency.  Musculoskeletal: Negative for myalgias.       Right hip pain  Skin: Negative for rash.       No lesions  Neurological: Negative for weakness.  Hematological: Does not bruise/bleed easily.  Psychiatric/Behavioral: Negative for suicidal ideas.     Physical Exam Triage Vital Signs ED Triage Vitals  Enc Vitals Group     BP 09/05/16 1214 (!) 163/57     Pulse Rate 09/05/16 1214 (!) 46     Resp 09/05/16 1214 20     Temp 09/05/16 1214 97.7 F (36.5 C)     Temp Source 09/05/16 1214 Oral     SpO2 09/05/16 1214 100 %     Weight --      Height --      Head Circumference --      Peak Flow --      Pain Score 09/05/16 1212 8     Pain Loc --      Pain Edu? --      Excl. in GC? --    No data found.   Updated Vital Signs BP (!) 163/57 (BP Location: Left Arm)   Pulse (!) 46 Comment: heartrate documented in medical record and patient and family aware  Temp 97.7 F (36.5 C) (Oral)   Resp 20   SpO2 100%   Visual Acuity Right Eye Distance:   Left Eye Distance:   Bilateral Distance:    Right Eye Near:   Left Eye Near:    Bilateral Near:     Physical Exam  Constitutional: He appears well-developed and well-nourished.  HENT:  Head: Normocephalic and atraumatic.  Eyes: Conjunctivae are normal.  Neck: Neck supple.  Cardiovascular: Normal rate and regular rhythm.   No murmur heard. Pulmonary/Chest: Effort normal and breath sounds normal. No respiratory distress.  Abdominal: Soft. There is no tenderness.  Musculoskeletal: He exhibits no edema.  Right hip pain with active flexion  Neurological: He is alert.  Skin: Skin is warm and dry.    Psychiatric: He has a normal mood and affect.  Nursing note and vitals reviewed.    UC Treatments / Results  Labs (all labs ordered are listed, but only abnormal results are displayed) Labs Reviewed - No data to display  EKG  EKG Interpretation None  Radiology Dg Hip Unilat With Pelvis 2-3 Views Right  Result Date: 09/05/2016 CLINICAL DATA:  Right hip pain for 1 week.  No known injury. EXAM: DG HIP (WITH OR WITHOUT PELVIS) 2-3V RIGHT COMPARISON:  None. FINDINGS: There is no evidence of hip fracture or dislocation. There is no evidence of arthropathy or other focal bone abnormality. IMPRESSION: No acute osseous injury of the right hip. Electronically Signed   By: Elige KoHetal  Patel   On: 09/05/2016 13:30    Procedures Procedures (including critical care time)  Medications Ordered in UC Medications - No data to display   Initial Impression / Assessment and Plan / UC Course  I have reviewed the triage vital signs and the nursing notes.  Pertinent labs & imaging results that were available during my care of the patient were reviewed by me and considered in my medical decision making (see chart for details).     Recent overnight admission to the hospital for CAP; pt denies anticoagulation during hospital stay. Still, low suspicion for DVT as no swelling, fever, palpable cords. Rx pain medication.  Prefers Tylenol over meloxicam.   Final Clinical Impressions(s) / UC Diagnoses   Final diagnoses:  Hip pain  Iliopsoas bursitis of right hip  Pain of right hip joint    New Prescriptions Discharge Medication List as of 09/05/2016  1:53 PM    START taking these medications   Details  oxyCODONE-acetaminophen (ROXICET) 5-325 MG tablet Take 1 tablet by mouth every 6 (six) hours as needed for severe pain., Starting Mon 09/05/2016, Until Sat 09/10/2016, Print         Arnaldo Nataliamond, Tamilyn Lupien S, MD 09/05/16 [phone]

## 2016-09-05 NOTE — ED Triage Notes (Addendum)
Lower back pain, mainly on right side.  Onset Tuesday morning.   Patient discharged from the hospital on 5/29 with pneumonia

## 2017-10-07 ENCOUNTER — Other Ambulatory Visit: Payer: Self-pay

## 2017-10-07 ENCOUNTER — Emergency Department (HOSPITAL_COMMUNITY): Payer: Medicare Other

## 2017-10-07 ENCOUNTER — Encounter (HOSPITAL_COMMUNITY): Payer: Self-pay | Admitting: Emergency Medicine

## 2017-10-07 ENCOUNTER — Emergency Department (HOSPITAL_COMMUNITY)
Admission: EM | Admit: 2017-10-07 | Discharge: 2017-10-08 | Disposition: A | Discharge status: Home / Self Care | Payer: Medicare Other | Source: Home / Self Care | Attending: Emergency Medicine | Admitting: Emergency Medicine

## 2017-10-07 DIAGNOSIS — E119 Type 2 diabetes mellitus without complications: Secondary | ICD-10-CM | POA: Insufficient documentation

## 2017-10-07 DIAGNOSIS — R1110 Vomiting, unspecified: Secondary | ICD-10-CM | POA: Diagnosis not present

## 2017-10-07 DIAGNOSIS — Z794 Long term (current) use of insulin: Secondary | ICD-10-CM

## 2017-10-07 DIAGNOSIS — I442 Atrioventricular block, complete: Secondary | ICD-10-CM | POA: Diagnosis not present

## 2017-10-07 DIAGNOSIS — R1011 Right upper quadrant pain: Secondary | ICD-10-CM

## 2017-10-07 DIAGNOSIS — K8020 Calculus of gallbladder without cholecystitis without obstruction: Secondary | ICD-10-CM | POA: Diagnosis not present

## 2017-10-07 DIAGNOSIS — K760 Fatty (change of) liver, not elsewhere classified: Secondary | ICD-10-CM | POA: Diagnosis not present

## 2017-10-07 DIAGNOSIS — Z6834 Body mass index (BMI) 34.0-34.9, adult: Secondary | ICD-10-CM | POA: Diagnosis not present

## 2017-10-07 DIAGNOSIS — R112 Nausea with vomiting, unspecified: Secondary | ICD-10-CM | POA: Insufficient documentation

## 2017-10-07 DIAGNOSIS — R079 Chest pain, unspecified: Secondary | ICD-10-CM

## 2017-10-07 DIAGNOSIS — K8001 Calculus of gallbladder with acute cholecystitis with obstruction: Secondary | ICD-10-CM | POA: Diagnosis not present

## 2017-10-07 DIAGNOSIS — I10 Essential (primary) hypertension: Secondary | ICD-10-CM | POA: Insufficient documentation

## 2017-10-07 DIAGNOSIS — R001 Bradycardia, unspecified: Secondary | ICD-10-CM | POA: Diagnosis not present

## 2017-10-07 DIAGNOSIS — I441 Atrioventricular block, second degree: Secondary | ICD-10-CM | POA: Diagnosis not present

## 2017-10-07 DIAGNOSIS — K828 Other specified diseases of gallbladder: Secondary | ICD-10-CM | POA: Diagnosis not present

## 2017-10-07 DIAGNOSIS — I2510 Atherosclerotic heart disease of native coronary artery without angina pectoris: Secondary | ICD-10-CM | POA: Insufficient documentation

## 2017-10-07 DIAGNOSIS — R1010 Upper abdominal pain, unspecified: Secondary | ICD-10-CM | POA: Insufficient documentation

## 2017-10-07 DIAGNOSIS — R1012 Left upper quadrant pain: Secondary | ICD-10-CM | POA: Diagnosis not present

## 2017-10-07 DIAGNOSIS — R0789 Other chest pain: Secondary | ICD-10-CM | POA: Diagnosis not present

## 2017-10-07 DIAGNOSIS — E669 Obesity, unspecified: Secondary | ICD-10-CM | POA: Diagnosis not present

## 2017-10-07 DIAGNOSIS — N179 Acute kidney failure, unspecified: Secondary | ICD-10-CM | POA: Diagnosis not present

## 2017-10-07 LAB — CBC
HCT: 38.3 % — ABNORMAL LOW (ref 39.0–52.0)
HEMOGLOBIN: 12 g/dL — AB (ref 13.0–17.0)
MCH: 28.3 pg (ref 26.0–34.0)
MCHC: 31.3 g/dL (ref 30.0–36.0)
MCV: 90.3 fL (ref 78.0–100.0)
Platelets: 289 10*3/uL (ref 150–400)
RBC: 4.24 MIL/uL (ref 4.22–5.81)
RDW: 13.4 % (ref 11.5–15.5)
WBC: 7.7 10*3/uL (ref 4.0–10.5)

## 2017-10-07 LAB — BASIC METABOLIC PANEL
Anion gap: 11 (ref 5–15)
BUN: 22 mg/dL (ref 8–23)
CO2: 25 mmol/L (ref 22–32)
Calcium: 9.9 mg/dL (ref 8.9–10.3)
Chloride: 102 mmol/L (ref 98–111)
Creatinine, Ser: 1.3 mg/dL — ABNORMAL HIGH (ref 0.61–1.24)
GFR calc Af Amer: >60 mL/min (ref >60)
GFR, EST NON AFRICAN AMERICAN: 52 mL/min — AB (ref >60)
GLUCOSE: 233 mg/dL — AB (ref 70–99)
Potassium: 3.4 mmol/L — ABNORMAL LOW (ref 3.5–5.1)
Sodium: 138 mmol/L (ref 135–145)

## 2017-10-07 LAB — I-STAT TROPONIN, ED: Troponin i, poc: 0 ng/mL (ref 0.00–0.08)

## 2017-10-07 MED ORDER — MORPHINE SULFATE (PF) 4 MG/ML IV SOLN
4.0000 mg | Freq: Once | INTRAVENOUS | Status: AC
  Administered 2017-10-07: 4 mg via INTRAVENOUS
  Filled 2017-10-07: qty 1

## 2017-10-07 MED ORDER — GI COCKTAIL ~~LOC~~
30.0000 mL | Freq: Once | ORAL | Status: AC
Start: 2017-10-07 — End: 2017-10-07
  Administered 2017-10-07: 30 mL via ORAL
  Filled 2017-10-07: qty 30

## 2017-10-07 MED ORDER — IOPAMIDOL (ISOVUE-370) INJECTION 76%
INTRAVENOUS | Status: AC
  Filled 2017-10-07: qty 100

## 2017-10-07 MED ORDER — ONDANSETRON HCL 4 MG/2ML IJ SOLN
4.0000 mg | Freq: Once | INTRAMUSCULAR | Status: AC
  Administered 2017-10-07: 4 mg via INTRAVENOUS
  Filled 2017-10-07: qty 2

## 2017-10-07 NOTE — ED Provider Notes (Signed)
[facility] EMERGENCY DEPARTMENT Provider Note   CSN: [id number] Arrival date & time: 10/07/17  2103     History   Chief Complaint Chief Complaint  Patient presents with  . Chest Pain    HPI Matthew Sellers is a 77 y.o. male.  Patient with a history of CAD, DM, HLD, HTN, bradycardia, presents with chest pain x 4 hours. He states he got up this morning and had no symptoms. This afternoon he ate lunch and subsequently had about 4-5 episodes of vomiting after which his chest started to hurt. It has been constant since onset. It is worse when lying down and better with sitting up or standing. He has a baseline SOB that is unchanged today. No diaphoresis. He has nitroglycerin at home but did not take any. No fever, cough, diarrhea. His nausea continues but no further vomiting since this afternoon.   The history is provided by the patient and the spouse. No language interpreter was used.  Chest Pain   Associated symptoms include abdominal pain (Epigastric), nausea and vomiting. Pertinent negatives include no back pain, no cough, no diaphoresis, no fever and no shortness of breath.    Past Medical History:  Diagnosis Date  . Anemia   . Bradycardia   . CAD (coronary artery disease)   . Chest tightness   . Diabetes mellitus without complication (HCC)   . Dyslipidemia   . Dyspnea on exertion   . Hypertension   . Obesity   . Pneumonia     Patient Active Problem List   Diagnosis Date Noted  . CAP (community acquired pneumonia) 06/29/2014  . Diabetes (HCC) 06/29/2014  . Hypertension 06/29/2014  . Hyperlipidemia 06/29/2014  . Hypokalemia 06/29/2014  . Anemia 06/29/2014  . Pneumonia 06/29/2014    Past Surgical History:  Procedure Laterality Date  . ACHILLES TENDON SURGERY     for rupture  . CARDIAC CATHETERIZATION  07/08/2009   mild to mod . prox LAD 50% (Dr. Garen Lah)  . DOPPLER ECHOCARDIOGRAPHY  04/10/2008   LVEF 70-80% ,norm  . stress myoview   04/09/2008   EF 54%; LV  norm        Home Medications    Prior to Admission medications   Medication Sig Start Date End Date Taking? Authorizing Provider  amoxicillin (AMOXIL) 500 MG capsule Take 2 capsules (1,000 mg total) by mouth 2 (two) times daily. 08/30/16   Dione Booze, MD  aspirin EC 81 MG tablet Take 81 mg by mouth daily.    [provider]  ferrous sulfate 325 (65 FE) MG tablet Take 325 mg by mouth daily with breakfast.    [provider]  hydrALAZINE (APRESOLINE) 100 MG tablet Take 100 mg by mouth 3 (three) times daily.    [provider]  insulin glargine (LANTUS) 100 UNIT/ML injection Inject 25 Units into the skin at bedtime.    [provider]  isosorbide mononitrate (IMDUR) 60 MG 24 hr tablet Take 60 mg by mouth daily.    [provider]  Boris Lown Oil 300 MG CAPS Take 300 mg by mouth daily.    [provider]  levofloxacin (LEVAQUIN) 750 MG tablet Take 1 tablet (750 mg total) by mouth daily. 06/30/14   Leroy Sea, MD  lisinopril-hydrochlorothiazide (PRINZIDE,ZESTORETIC) 20-25 MG per tablet Take 2 tablets by mouth daily.    [provider]  metFORMIN (GLUCOPHAGE) 1000 MG tablet Take 1,000 mg by mouth 2 (two) times daily with a meal.  [provider]  Multiple Vitamin (MULTIVITAMIN WITH MINERALS) TABS tablet Take 1 tablet by mouth daily.    [provider]  nitroGLYCERIN (NITROSTAT) 0.4 MG SL tablet Place 0.4 mg under the tongue every 5 (five) minutes as needed for chest pain.    [provider]  ondansetron (ZOFRAN ODT) 4 MG disintegrating tablet Take 1 tablet (4 mg total) by mouth every 8 (eight) hours as needed for nausea or vomiting. 04/17/16   Melene Plan, DO  potassium chloride SA (K-DUR,KLOR-CON) 20 MEQ tablet Take 1 tablet (20 mEq total) by mouth daily. Take one tablet twice a day for five days, then one tablet once a day 08/30/16   Dione Booze, MD  simvastatin (ZOCOR) 20 MG  tablet Take 10 mg by mouth at bedtime. Take 1/2 tablet  At bedtime    [provider]    Family History Family History  Problem Relation Age of Onset  . Leukemia Mother     Social History Social History   Tobacco Use  . Smoking status: Never Smoker  . Smokeless tobacco: Never Used  Substance Use Topics  . Alcohol use: No    Alcohol/week: 0.0 oz  . Drug use: No     Allergies   Patient has no known allergies.   Review of Systems Review of Systems  Constitutional: Negative for chills, diaphoresis and fever.  HENT: Negative.   Respiratory: Negative.  Negative for cough and shortness of breath.   Cardiovascular: Positive for chest pain.  Gastrointestinal: Positive for abdominal pain (Epigastric), nausea and vomiting. Negative for diarrhea.  Musculoskeletal: Negative.  Negative for back pain.  Skin: Negative.   Neurological: Negative.      Physical Exam Updated Vital Signs BP (!) 198/60 (BP Location: Left Arm)   Pulse (!) 43   Temp 98.5 F (36.9 C) (Oral)   Resp 17   SpO2 100%   Physical Exam  Constitutional: He is oriented to person, place, and time. He appears well-developed and well-nourished.  HENT:  Head: Normocephalic.  Neck: Normal range of motion. Neck supple.  Cardiovascular: Normal rate and regular rhythm.  Pulmonary/Chest: Effort normal and breath sounds normal. He has no wheezes. He has no rhonchi. He has no rales.  Abdominal: Soft. Bowel sounds are normal. There is tenderness (diffuse but worst in epigastrium and right side). There is no rebound and no guarding.  Musculoskeletal: Normal range of motion.  Neurological: He is alert and oriented to person, place, and time.  Skin: Skin is warm and dry. No rash noted.  Psychiatric: He has a normal mood and affect.     ED Treatments / Results  Labs (all labs ordered are listed, but only abnormal results are displayed) Labs Reviewed  BASIC METABOLIC PANEL - Abnormal; Notable for the  following components:      Result Value   Potassium 3.4 (*)    Glucose, Bld 233 (*)    Creatinine, Ser 1.30 (*)    GFR calc non Af Amer 52 (*)    All other components within normal limits  CBC - Abnormal; Notable for the following components:   Hemoglobin 12.0 (*)    HCT 38.3 (*)    All other components within normal limits  I-STAT TROPONIN, ED   Results for orders placed or performed during the hospital encounter of 10/07/17  Basic metabolic panel  Result Value Ref Range   Sodium 138 135 - 145 mmol/L   Potassium 3.4 (L) 3.5 - 5.1 mmol/L  Chloride 102 98 - 111 mmol/L   CO2 25 22 - 32 mmol/L   Glucose, Bld 233 (H) 70 - 99 mg/dL   BUN 22 8 - 23 mg/dL   Creatinine, Ser 1.61 (H) 0.61 - 1.24 mg/dL   Calcium 9.9 8.9 - 09.6 mg/dL   GFR calc non Af Amer 52 (L) >60 mL/min   GFR calc Af Amer >60 >60 mL/min   Anion gap 11 5 - 15  CBC  Result Value Ref Range   WBC 7.7 4.0 - 10.5 K/uL   RBC 4.24 4.22 - 5.81 MIL/uL   Hemoglobin 12.0 (L) 13.0 - 17.0 g/dL   HCT 04.5 (L) 40.9 - 81.1 %   MCV 90.3 78.0 - 100.0 fL   MCH 28.3 26.0 - 34.0 pg   MCHC 31.3 30.0 - 36.0 g/dL   RDW 91.4 78.2 - 95.6 %   Platelets 289 150 - 400 K/uL  I-stat troponin, ED  Result Value Ref Range   Troponin i, poc 0.00 0.00 - 0.08 ng/mL   Comment 3            EKG None  Radiology Dg Chest 2 View  Result Date: 10/07/2017 CLINICAL DATA:  Vomiting EXAM: CHEST - 2 VIEW COMPARISON:  08/30/2016 FINDINGS: Mild cardiomegaly. Aortic atherosclerosis. Both lungs are clear. The visualized skeletal structures are unremarkable. IMPRESSION: No active cardiopulmonary disease. Electronically Signed   By: Jasmine Pang M.D.   On: 10/07/2017 21:27  Dg Chest 2 View  Result Date: 10/07/2017 CLINICAL DATA:  Vomiting EXAM: CHEST - 2 VIEW COMPARISON:  08/30/2016 FINDINGS: Mild cardiomegaly. Aortic atherosclerosis. Both lungs are clear. The visualized skeletal structures are unremarkable. IMPRESSION: No active cardiopulmonary disease.  Electronically Signed   By: Jasmine Pang M.D.   On: 10/07/2017 21:27   US Abdomen Limited  Result Date: 10/08/2017 CLINICAL DATA:  Right upper quadrant pain and vomiting for 4 hours. Gallstones seen on CT. EXAM: ULTRASOUND ABDOMEN LIMITED RIGHT UPPER QUADRANT COMPARISON:  CT chest 10/08/2017 FINDINGS: Examination is technically limited due to patient's body habitus and abdominal distention. Gallbladder: No discrete stones identified in the gallbladder. There is evidence of layering sludge. No wall thickening or edema. Murphy's sign is negative. Common bile duct: Diameter: 6.8 mm, normal Liver: Diffusely increased parenchymal echotexture suggesting fatty infiltration. No focal liver lesions are identified as visualized. Portal vein is patent on color Doppler imaging with normal direction of blood flow towards the liver. IMPRESSION: Layering sludge in the gallbladder. No stones. No additional changes to suggest cholecystitis. Diffuse fatty infiltration of the liver. Electronically Signed   By: Burman Nieves M.D.   On: 10/08/2017 02:52   Ct Angio Chest/abd/pel For Dissection W And/or W/wo  Result Date: 10/08/2017 CLINICAL DATA:  Vomiting with central dull chest pain EXAM: CT ANGIOGRAPHY CHEST, ABDOMEN AND PELVIS TECHNIQUE: Multidetector CT imaging through the chest, abdomen and pelvis was performed using the standard protocol during bolus administration of intravenous contrast. Multiplanar reconstructed images and MIPs were obtained and reviewed to evaluate the vascular anatomy. CONTRAST:  ISOVUE-370 IOPAMIDOL (ISOVUE-370) INJECTION 76% COMPARISON:  Radiograph 10/07/2017, CT chest 06/01/2009 FINDINGS: CTA CHEST FINDINGS Cardiovascular: Non contrasted images of the chest demonstrate no intramural hematoma. Nonaneurysmal aorta. No dissection seen. Mild aortic atherosclerosis. Minimal coronary vascular calcification. Borderline cardiomegaly. No pericardial effusion Mediastinum/Nodes: No enlarged  mediastinal, hilar, or axillary lymph nodes. Thyroid gland, trachea, and esophagus demonstrate no significant findings. Lungs/Pleura: Stable 4 mm right upper lobe subpleural pulmonary nodule. No acute consolidation or  effusion. No pneumothorax. Musculoskeletal: No chest wall abnormality. No acute or significant osseous findings. Review of the MIP images confirms the above findings. CTA ABDOMEN AND PELVIS FINDINGS VASCULAR Aorta: Mild aortic atherosclerosis without aneurysm or dissection. No significant stenosis Celiac: Patent without evidence of aneurysm, dissection, vasculitis or significant stenosis. SMA: Patent without evidence of aneurysm, dissection, vasculitis or significant stenosis. Renals: Both renal arteries are patent without evidence of aneurysm, dissection, vasculitis, fibromuscular dysplasia or significant stenosis. IMA: Patent without evidence of aneurysm, dissection, vasculitis or significant stenosis. Inflow: Patent without evidence of aneurysm, dissection, vasculitis or significant stenosis. Review of the MIP images confirms the above findings. NON-VASCULAR Hepatobiliary: Calcified gallstones. No focal hepatic abnormality or biliary dilatation Pancreas: Unremarkable. No pancreatic ductal dilatation or surrounding inflammatory changes. Spleen: Normal in size without focal abnormality. Adrenals/Urinary Tract: Adrenal glands are unremarkable. Kidneys are normal, without renal calculi, focal lesion, or hydronephrosis. Bladder is unremarkable. Stomach/Bowel: Stomach is within normal limits. Appendix appears normal. No evidence of bowel wall thickening, distention, or inflammatory changes. Lymphatic: No significantly enlarged lymph nodes Reproductive: Prostate is unremarkable. Other: Fat in the left inguinal canal.  No free air or free fluid Musculoskeletal: No acute or significant osseous findings. Review of the MIP images confirms the above findings. IMPRESSION: 1. Negative for aortic aneurysm or  dissection. 2. No CT evidence for acute intra-abdominal or pelvic abnormality. 3. No significant vascular stenosis within the abdomen or pelvis. Mild aortic atherosclerosis 4. Gallstones Electronically Signed   By: Jasmine PangKim  Fujinaga M.D.   On: 10/08/2017 01:26     Procedures Procedures (including critical care time)  Medications Ordered in ED Medications - No data to display   Initial Impression / Assessment and Plan / ED Course  I have reviewed the triage vital signs and the nursing notes.  Pertinent labs & imaging results that were available during my care of the patient were reviewed by me and considered in my medical decision making (see chart for details).     Patient presents after sudden onset nausea with multiple episodes of NBNB emesis with subsequent pain in the lower center chest and epigastrium. Symptoms started around 2:30 in the afternoon (10/07/17).   Gi cocktail without relief of his pain. IV started and he is given morphine and zofran. He reports symptoms have resolved over time.   DDx: cardiac ischemia vs dissection vs GI source.   Dissection r/o by negative CTA of chest and abdomen. His EKG show bradycardia only, with history of persistent bradycardia. No ischemia. Troponin x 2 have been negative. Labs otherwise unremarkable.   Evaluation of abdomen, including RUQ US to evaluate for cholecystitis is negative showing gall stones and sludge only. LFT's normal.   The patient's pain is better. Re-exam of the abdomen finds him nontender. VSS. No development of fever.   Discussed work up with wife and patient. He is felt appropriate for discharge home and is comfortable with plan of discharge.   Final Clinical Impressions(s) / ED Diagnoses   Final diagnoses:  None   1. Nausea and vomiting 2. Nonspecific chest pain 3. Upper abdominal pain  ED Discharge Orders    None       Elpidio AnisUpstill, Ardel Jagger, PA-C 10/08/17 0341    Donnetta Hutchingook, Brian, MD 10/08/17 [phone]

## 2017-10-07 NOTE — ED Triage Notes (Signed)
Pt states he vomited about 3-4 hours ago and then began having chest pain.  Dull, central, non radiating. 8/10.

## 2017-10-08 ENCOUNTER — Other Ambulatory Visit: Payer: Self-pay

## 2017-10-08 ENCOUNTER — Emergency Department (HOSPITAL_COMMUNITY): Payer: Medicare Other

## 2017-10-08 DIAGNOSIS — K828 Other specified diseases of gallbladder: Secondary | ICD-10-CM | POA: Diagnosis not present

## 2017-10-08 DIAGNOSIS — K760 Fatty (change of) liver, not elsewhere classified: Secondary | ICD-10-CM | POA: Diagnosis not present

## 2017-10-08 LAB — HEPATIC FUNCTION PANEL
ALBUMIN: 4 g/dL (ref 3.5–5.0)
ALK PHOS: 37 U/L — AB (ref 38–126)
ALT: 14 U/L (ref 0–44)
AST: 17 U/L (ref 15–41)
BILIRUBIN DIRECT: 0.2 mg/dL (ref 0.0–0.2)
BILIRUBIN TOTAL: 1.1 mg/dL (ref 0.3–1.2)
Indirect Bilirubin: 0.9 mg/dL (ref 0.3–0.9)
Total Protein: 7 g/dL (ref 6.5–8.1)

## 2017-10-08 LAB — CBG MONITORING, ED: GLUCOSE-CAPILLARY: 230 mg/dL — AB (ref 70–99)

## 2017-10-08 LAB — I-STAT TROPONIN, ED: Troponin i, poc: 0.01 ng/mL (ref 0.00–0.08)

## 2017-10-08 MED ORDER — METOCLOPRAMIDE HCL 10 MG PO TABS: 10.0000 mg | ORAL_TABLET | Freq: Four times a day (QID) | ORAL | 0 refills | Status: DC

## 2017-10-08 MED ORDER — IOPAMIDOL (ISOVUE-370) INJECTION 76%
100.0000 mL | Freq: Once | INTRAVENOUS | Status: AC | PRN
  Administered 2017-10-08: 100 mL via INTRAVENOUS

## 2017-10-08 NOTE — ED Provider Notes (Signed)
Reviewed chart from visit last night. EKG from 22:11 reviewed. Difficult to tell whether second degree or third degree AV block. Concern for possible third degree block. Called patient who said he feels well without dizziness or lightheadedness. Recommended return to the ED for repeat EKG to assess for possible complete heart block. He is agreeable.   [name], Trisha Ken, MD 10/08/17 1754

## 2017-10-08 NOTE — ED Notes (Signed)
Pt assisted up at bedside to use urinal. Pt able to stand on his own. No reported incr in CP or other associated symptoms with exertion.

## 2017-10-08 NOTE — Discharge Instructions (Addendum)
[facility] of symptoms today. Take Reglan if needed for nausea and upper abdominal pain. Return to the emergency department for any worsening symptoms or new concerns. Results for orders placed or performed during the hospital encounter of 10/07/17  Basic metabolic panel  Result Value Ref Range   Sodium 138 135 - 145 mmol/L   Potassium 3.4 (L) 3.5 - 5.1 mmol/L   Chloride 102 98 - 111 mmol/L   CO2 25 22 - 32 mmol/L   Glucose, Bld 233 (H) 70 - 99 mg/dL   BUN 22 8 - 23 mg/dL   Creatinine, Ser 1.611.30 (H) 0.61 - 1.24 mg/dL   Calcium 9.9 8.9 - 09.610.3 mg/dL   GFR calc non Af Amer 52 (L) >60 mL/min   GFR calc Af Amer >60 >60 mL/min   Anion gap 11 5 - 15  CBC  Result Value Ref Range   WBC 7.7 4.0 - 10.5 K/uL   RBC 4.24 4.22 - 5.81 MIL/uL   Hemoglobin 12.0 (L) 13.0 - 17.0 g/dL   HCT 04.538.3 (L) 40.939.0 - 81.152.0 %   MCV 90.3 78.0 - 100.0 fL   MCH 28.3 26.0 - 34.0 pg   MCHC 31.3 30.0 - 36.0 g/dL   RDW 91.413.4 78.211.5 - 95.615.5 %   Platelets 289 150 - 400 K/uL  Hepatic function panel  Result Value Ref Range   Total Protein 7.0 6.5 - 8.1 g/dL   Albumin 4.0 3.5 - 5.0 g/dL   AST 17 15 - 41 U/L   ALT 14 0 - 44 U/L   Alkaline Phosphatase 37 (L) 38 - 126 U/L   Total Bilirubin 1.1 0.3 - 1.2 mg/dL   Bilirubin, Direct 0.2 0.0 - 0.2 mg/dL   Indirect Bilirubin 0.9 0.3 - 0.9 mg/dL  I-stat troponin, ED  Result Value Ref Range   Troponin i, poc 0.00 0.00 - 0.08 ng/mL   Comment 3          I-stat troponin, ED  Result Value Ref Range   Troponin i, poc 0.01 0.00 - 0.08 ng/mL   Comment 3          CBG monitoring, ED  Result Value Ref Range   Glucose-Capillary 230 (H) 70 - 99 mg/dL   Dg Chest 2 View  Result Date: 10/07/2017 CLINICAL DATA:  Vomiting EXAM: CHEST - 2 VIEW COMPARISON:  08/30/2016 FINDINGS: Mild cardiomegaly. Aortic atherosclerosis. Both lungs are clear. The visualized skeletal structures are unremarkable. IMPRESSION: No active cardiopulmonary disease. Electronically  Signed   By: Jasmine PangKim  Fujinaga M.D.   On: 10/07/2017 21:27   [place] Abdomen Limited  Result Date: 10/08/2017 CLINICAL DATA:  Right upper quadrant pain and vomiting for 4 hours. Gallstones seen on CT. EXAM: ULTRASOUND ABDOMEN LIMITED RIGHT UPPER QUADRANT COMPARISON:  CT chest 10/08/2017 FINDINGS: Examination is technically limited due to patient's body habitus and abdominal distention. Gallbladder: No discrete stones identified in the gallbladder. There is evidence of layering sludge. No wall thickening or edema. Murphy's sign is negative. Common bile duct: Diameter: 6.8 mm, normal Liver: Diffusely increased parenchymal echotexture suggesting fatty infiltration. No focal liver lesions are identified as visualized. Portal vein is patent on color Doppler imaging with normal direction of blood flow towards the liver. IMPRESSION: Layering sludge in the gallbladder. No stones. No additional changes to suggest cholecystitis. Diffuse fatty infiltration of the liver. Electronically Signed   By: Burman NievesWilliam  Stevens M.D.   On: 10/08/2017 02:52   Ct  Angio Chest/abd/pel For Dissection W And/or W/wo  Result Date: 10/08/2017 CLINICAL DATA:  Vomiting with central dull chest pain EXAM: CT ANGIOGRAPHY CHEST, ABDOMEN AND PELVIS TECHNIQUE: Multidetector CT imaging through the chest, abdomen and pelvis was performed using the standard protocol during bolus administration of intravenous contrast. Multiplanar reconstructed images and MIPs were obtained and reviewed to evaluate the vascular anatomy. CONTRAST:  ISOVUE-370 IOPAMIDOL (ISOVUE-370) INJECTION 76% COMPARISON:  Radiograph 10/07/2017, CT chest 06/01/2009 FINDINGS: CTA CHEST FINDINGS Cardiovascular: Non contrasted images of the chest demonstrate no intramural hematoma. Nonaneurysmal aorta. No dissection seen. Mild aortic atherosclerosis. Minimal coronary vascular calcification. Borderline cardiomegaly. No pericardial effusion Mediastinum/Nodes: No enlarged mediastinal, hilar, or  axillary lymph nodes. Thyroid gland, trachea, and esophagus demonstrate no significant findings. Lungs/Pleura: Stable 4 mm right upper lobe subpleural pulmonary nodule. No acute consolidation or effusion. No pneumothorax. Musculoskeletal: No chest wall abnormality. No acute or significant osseous findings. Review of the MIP images confirms the above findings. CTA ABDOMEN AND PELVIS FINDINGS VASCULAR Aorta: Mild aortic atherosclerosis without aneurysm or dissection. No significant stenosis Celiac: Patent without evidence of aneurysm, dissection, vasculitis or significant stenosis. SMA: Patent without evidence of aneurysm, dissection, vasculitis or significant stenosis. Renals: Both renal arteries are patent without evidence of aneurysm, dissection, vasculitis, fibromuscular dysplasia or significant stenosis. IMA: Patent without evidence of aneurysm, dissection, vasculitis or significant stenosis. Inflow: Patent without evidence of aneurysm, dissection, vasculitis or significant stenosis. Review of the MIP images confirms the above findings. NON-VASCULAR Hepatobiliary: Calcified gallstones. No focal hepatic abnormality or biliary dilatation Pancreas: Unremarkable. No pancreatic ductal dilatation or surrounding inflammatory changes. Spleen: Normal in size without focal abnormality. Adrenals/Urinary Tract: Adrenal glands are unremarkable. Kidneys are normal, without renal calculi, focal lesion, or hydronephrosis. Bladder is unremarkable. Stomach/Bowel: Stomach is within normal limits. Appendix appears normal. No evidence of bowel wall thickening, distention, or inflammatory changes. Lymphatic: No significantly enlarged lymph nodes Reproductive: Prostate is unremarkable. Other: Fat in the left inguinal canal.  No free air or free fluid Musculoskeletal: No acute or significant osseous findings. Review of the MIP images confirms the above findings. IMPRESSION: 1. Negative for aortic aneurysm or dissection. 2. No CT  evidence for acute intra-abdominal or pelvic abnormality. 3. No significant vascular stenosis within the abdomen or pelvis. Mild aortic atherosclerosis 4. Gallstones Electronically Signed   By: Jasmine Pang M.D.   On: 10/08/2017 01:26

## 2017-10-08 NOTE — ED Notes (Signed)
Pt departed in NAD.  

## 2017-10-08 NOTE — ED Notes (Signed)
Patient transported to CT 

## 2017-10-09 ENCOUNTER — Other Ambulatory Visit: Payer: Self-pay

## 2017-10-09 ENCOUNTER — Other Ambulatory Visit (HOSPITAL_COMMUNITY): Payer: Medicare Other

## 2017-10-09 ENCOUNTER — Encounter (HOSPITAL_COMMUNITY): Payer: Self-pay | Admitting: Internal Medicine

## 2017-10-09 ENCOUNTER — Inpatient Hospital Stay (HOSPITAL_COMMUNITY)
Admission: EM | Admit: 2017-10-09 | Discharge: 2017-10-14 | DRG: 418 | Disposition: A | Discharge status: Skilled Nursing Facility | Payer: Medicare Other | Attending: Internal Medicine | Admitting: Internal Medicine

## 2017-10-09 ENCOUNTER — Emergency Department (HOSPITAL_COMMUNITY): Payer: Medicare Other

## 2017-10-09 DIAGNOSIS — R509 Fever, unspecified: Secondary | ICD-10-CM | POA: Diagnosis not present

## 2017-10-09 DIAGNOSIS — R278 Other lack of coordination: Secondary | ICD-10-CM | POA: Diagnosis not present

## 2017-10-09 DIAGNOSIS — Z7982 Long term (current) use of aspirin: Secondary | ICD-10-CM | POA: Diagnosis not present

## 2017-10-09 DIAGNOSIS — K8001 Calculus of gallbladder with acute cholecystitis with obstruction: Secondary | ICD-10-CM | POA: Diagnosis present

## 2017-10-09 DIAGNOSIS — E876 Hypokalemia: Secondary | ICD-10-CM | POA: Diagnosis present

## 2017-10-09 DIAGNOSIS — R1011 Right upper quadrant pain: Secondary | ICD-10-CM | POA: Diagnosis not present

## 2017-10-09 DIAGNOSIS — K82A1 Gangrene of gallbladder in cholecystitis: Secondary | ICD-10-CM | POA: Diagnosis present

## 2017-10-09 DIAGNOSIS — Z79899 Other long term (current) drug therapy: Secondary | ICD-10-CM

## 2017-10-09 DIAGNOSIS — G4733 Obstructive sleep apnea (adult) (pediatric): Secondary | ICD-10-CM | POA: Diagnosis present

## 2017-10-09 DIAGNOSIS — N179 Acute kidney failure, unspecified: Secondary | ICD-10-CM | POA: Diagnosis not present

## 2017-10-09 DIAGNOSIS — Z950 Presence of cardiac pacemaker: Secondary | ICD-10-CM | POA: Diagnosis not present

## 2017-10-09 DIAGNOSIS — E11649 Type 2 diabetes mellitus with hypoglycemia without coma: Secondary | ICD-10-CM | POA: Diagnosis not present

## 2017-10-09 DIAGNOSIS — I441 Atrioventricular block, second degree: Secondary | ICD-10-CM

## 2017-10-09 DIAGNOSIS — I129 Hypertensive chronic kidney disease with stage 1 through stage 4 chronic kidney disease, or unspecified chronic kidney disease: Secondary | ICD-10-CM | POA: Diagnosis present

## 2017-10-09 DIAGNOSIS — E669 Obesity, unspecified: Secondary | ICD-10-CM | POA: Diagnosis present

## 2017-10-09 DIAGNOSIS — R109 Unspecified abdominal pain: Secondary | ICD-10-CM | POA: Diagnosis present

## 2017-10-09 DIAGNOSIS — N183 Chronic kidney disease, stage 3 unspecified: Secondary | ICD-10-CM | POA: Diagnosis present

## 2017-10-09 DIAGNOSIS — I440 Atrioventricular block, first degree: Secondary | ICD-10-CM | POA: Diagnosis not present

## 2017-10-09 DIAGNOSIS — Z9989 Dependence on other enabling machines and devices: Secondary | ICD-10-CM | POA: Diagnosis not present

## 2017-10-09 DIAGNOSIS — E118 Type 2 diabetes mellitus with unspecified complications: Secondary | ICD-10-CM | POA: Diagnosis not present

## 2017-10-09 DIAGNOSIS — M6281 Muscle weakness (generalized): Secondary | ICD-10-CM | POA: Diagnosis not present

## 2017-10-09 DIAGNOSIS — Z6834 Body mass index (BMI) 34.0-34.9, adult: Secondary | ICD-10-CM

## 2017-10-09 DIAGNOSIS — R1010 Upper abdominal pain, unspecified: Secondary | ICD-10-CM

## 2017-10-09 DIAGNOSIS — K8012 Calculus of gallbladder with acute and chronic cholecystitis without obstruction: Secondary | ICD-10-CM | POA: Diagnosis not present

## 2017-10-09 DIAGNOSIS — Z794 Long term (current) use of insulin: Secondary | ICD-10-CM

## 2017-10-09 DIAGNOSIS — K760 Fatty (change of) liver, not elsewhere classified: Secondary | ICD-10-CM | POA: Diagnosis present

## 2017-10-09 DIAGNOSIS — I361 Nonrheumatic tricuspid (valve) insufficiency: Secondary | ICD-10-CM | POA: Diagnosis not present

## 2017-10-09 DIAGNOSIS — R1012 Left upper quadrant pain: Secondary | ICD-10-CM

## 2017-10-09 DIAGNOSIS — E1122 Type 2 diabetes mellitus with diabetic chronic kidney disease: Secondary | ICD-10-CM | POA: Diagnosis present

## 2017-10-09 DIAGNOSIS — Z7401 Bed confinement status: Secondary | ICD-10-CM | POA: Diagnosis not present

## 2017-10-09 DIAGNOSIS — E785 Hyperlipidemia, unspecified: Secondary | ICD-10-CM | POA: Diagnosis not present

## 2017-10-09 DIAGNOSIS — E119 Type 2 diabetes mellitus without complications: Secondary | ICD-10-CM

## 2017-10-09 DIAGNOSIS — R001 Bradycardia, unspecified: Secondary | ICD-10-CM | POA: Diagnosis present

## 2017-10-09 DIAGNOSIS — M25552 Pain in left hip: Secondary | ICD-10-CM | POA: Diagnosis present

## 2017-10-09 DIAGNOSIS — K8020 Calculus of gallbladder without cholecystitis without obstruction: Secondary | ICD-10-CM | POA: Diagnosis not present

## 2017-10-09 DIAGNOSIS — R748 Abnormal levels of other serum enzymes: Secondary | ICD-10-CM | POA: Diagnosis not present

## 2017-10-09 DIAGNOSIS — I4430 Unspecified atrioventricular block: Secondary | ICD-10-CM | POA: Diagnosis not present

## 2017-10-09 DIAGNOSIS — K8063 Calculus of gallbladder and bile duct with acute cholecystitis with obstruction: Secondary | ICD-10-CM | POA: Diagnosis not present

## 2017-10-09 DIAGNOSIS — K810 Acute cholecystitis: Secondary | ICD-10-CM | POA: Diagnosis not present

## 2017-10-09 DIAGNOSIS — M2550 Pain in unspecified joint: Secondary | ICD-10-CM | POA: Diagnosis not present

## 2017-10-09 DIAGNOSIS — R0789 Other chest pain: Secondary | ICD-10-CM | POA: Diagnosis not present

## 2017-10-09 DIAGNOSIS — I442 Atrioventricular block, complete: Secondary | ICD-10-CM

## 2017-10-09 DIAGNOSIS — I10 Essential (primary) hypertension: Secondary | ICD-10-CM | POA: Diagnosis not present

## 2017-10-09 DIAGNOSIS — I2510 Atherosclerotic heart disease of native coronary artery without angina pectoris: Secondary | ICD-10-CM | POA: Diagnosis present

## 2017-10-09 DIAGNOSIS — R41841 Cognitive communication deficit: Secondary | ICD-10-CM | POA: Diagnosis not present

## 2017-10-09 DIAGNOSIS — K8066 Calculus of gallbladder and bile duct with acute and chronic cholecystitis without obstruction: Secondary | ICD-10-CM | POA: Diagnosis not present

## 2017-10-09 DIAGNOSIS — R079 Chest pain, unspecified: Secondary | ICD-10-CM | POA: Diagnosis not present

## 2017-10-09 LAB — MRSA PCR SCREENING: MRSA by PCR: NEGATIVE

## 2017-10-09 LAB — TSH: TSH: 2.703 u[IU]/mL (ref 0.350–4.500)

## 2017-10-09 LAB — COMPREHENSIVE METABOLIC PANEL
ALK PHOS: 38 U/L (ref 38–126)
ALT: 12 U/L (ref 0–44)
AST: 20 U/L (ref 15–41)
Albumin: 3.6 g/dL (ref 3.5–5.0)
Anion gap: 10 (ref 5–15)
BUN: 25 mg/dL — AB (ref 8–23)
CALCIUM: 9.1 mg/dL (ref 8.9–10.3)
CHLORIDE: 100 mmol/L (ref 98–111)
CO2: 28 mmol/L (ref 22–32)
Creatinine, Ser: 1.61 mg/dL — ABNORMAL HIGH (ref 0.61–1.24)
GFR calc Af Amer: 47 mL/min — ABNORMAL LOW (ref >60)
GFR, EST NON AFRICAN AMERICAN: 40 mL/min — AB (ref >60)
Glucose, Bld: 223 mg/dL — ABNORMAL HIGH (ref 70–99)
Potassium: 3.3 mmol/L — ABNORMAL LOW (ref 3.5–5.1)
Sodium: 138 mmol/L (ref 135–145)
Total Bilirubin: 1.9 mg/dL — ABNORMAL HIGH (ref 0.3–1.2)
Total Protein: 6.6 g/dL (ref 6.5–8.1)

## 2017-10-09 LAB — LIPASE, BLOOD: LIPASE: 36 U/L (ref 11–51)

## 2017-10-09 LAB — LACTIC ACID, PLASMA
LACTIC ACID, VENOUS: 1.3 mmol/L (ref 0.5–1.9)
LACTIC ACID, VENOUS: 1.1 mmol/L (ref 0.5–1.9)

## 2017-10-09 LAB — HEMOGLOBIN A1C
HEMOGLOBIN A1C: 7.5 % — AB (ref 4.8–5.6)
Mean Plasma Glucose: 168.55 mg/dL

## 2017-10-09 LAB — CBC
HCT: 35.4 % — ABNORMAL LOW (ref 39.0–52.0)
HEMOGLOBIN: 11.3 g/dL — AB (ref 13.0–17.0)
MCH: 28.5 pg (ref 26.0–34.0)
MCHC: 31.9 g/dL (ref 30.0–36.0)
MCV: 89.4 fL (ref 78.0–100.0)
PLATELETS: 240 10*3/uL (ref 150–400)
RBC: 3.96 MIL/uL — AB (ref 4.22–5.81)
RDW: 13.6 % (ref 11.5–15.5)
WBC: 11.3 10*3/uL — AB (ref 4.0–10.5)

## 2017-10-09 LAB — TROPONIN I
TROPONIN I: 0.03 ng/mL — AB (ref <0.03)
Troponin I: 0.04 ng/mL (ref <0.03)
Troponin I: 0.04 ng/mL (ref <0.03)

## 2017-10-09 LAB — PHOSPHORUS: Phosphorus: 3.2 mg/dL (ref 2.5–4.6)

## 2017-10-09 LAB — GLUCOSE, CAPILLARY: Glucose-Capillary: 205 mg/dL — ABNORMAL HIGH (ref 70–99)

## 2017-10-09 LAB — MAGNESIUM: Magnesium: 1.7 mg/dL (ref 1.7–2.4)

## 2017-10-09 MED ORDER — ENOXAPARIN SODIUM 40 MG/0.4ML ~~LOC~~ SOLN
40.0000 mg | SUBCUTANEOUS | Status: DC
  Administered 2017-10-09 – 2017-10-13 (×4): 40 mg via SUBCUTANEOUS
  Filled 2017-10-09 (×4): qty 0.4

## 2017-10-09 MED ORDER — METOCLOPRAMIDE HCL 10 MG PO TABS
10.0000 mg | ORAL_TABLET | Freq: Four times a day (QID) | ORAL | Status: DC
  Administered 2017-10-10 – 2017-10-14 (×15): 10 mg via ORAL
  Filled 2017-10-09 (×15): qty 1

## 2017-10-09 MED ORDER — NON FORMULARY: 1.0000 [drp] | Freq: Two times a day (BID) | Status: DC

## 2017-10-09 MED ORDER — ASPIRIN EC 81 MG PO TBEC
81.0000 mg | DELAYED_RELEASE_TABLET | Freq: Every day | ORAL | Status: DC
  Administered 2017-10-10 – 2017-10-14 (×5): 81 mg via ORAL
  Filled 2017-10-09 (×5): qty 1

## 2017-10-09 MED ORDER — LATANOPROST 0.005 % OP SOLN
1.0000 [drp] | Freq: Every day | OPHTHALMIC | Status: DC
  Administered 2017-10-09 – 2017-10-13 (×5): 1 [drp] via OPHTHALMIC
  Filled 2017-10-09: qty 2.5

## 2017-10-09 MED ORDER — SODIUM CHLORIDE 0.9% FLUSH
3.0000 mL | Freq: Two times a day (BID) | INTRAVENOUS | Status: DC
  Administered 2017-10-09 – 2017-10-13 (×5): 3 mL via INTRAVENOUS

## 2017-10-09 MED ORDER — LACTATED RINGERS IV SOLN
INTRAVENOUS | Status: DC
  Administered 2017-10-09: 20:00:00 via INTRAVENOUS

## 2017-10-09 MED ORDER — ACETAMINOPHEN 325 MG PO TABS
650.0000 mg | ORAL_TABLET | Freq: Four times a day (QID) | ORAL | Status: DC | PRN
  Administered 2017-10-09: 650 mg via ORAL
  Filled 2017-10-09: qty 2

## 2017-10-09 MED ORDER — INSULIN ASPART 100 UNIT/ML ~~LOC~~ SOLN: 0.0000 [IU] | Freq: Three times a day (TID) | SUBCUTANEOUS | Status: DC

## 2017-10-09 MED ORDER — BRINZOLAMIDE-BRIMONIDINE 1-0.2 % OP SUSP
1.0000 [drp] | Freq: Two times a day (BID) | OPHTHALMIC | Status: DC
  Administered 2017-10-09 – 2017-10-14 (×10): 1 [drp] via OPHTHALMIC

## 2017-10-09 MED ORDER — ONDANSETRON HCL 4 MG/2ML IJ SOLN: 4.0000 mg | Freq: Four times a day (QID) | INTRAMUSCULAR | Status: DC | PRN

## 2017-10-09 MED ORDER — ONDANSETRON HCL 4 MG PO TABS: 4.0000 mg | ORAL_TABLET | Freq: Four times a day (QID) | ORAL | Status: DC | PRN

## 2017-10-09 MED ORDER — ONDANSETRON HCL 4 MG/2ML IJ SOLN: 4.0000 mg | Freq: Once | INTRAMUSCULAR | Status: DC

## 2017-10-09 MED ORDER — INSULIN GLARGINE 100 UNIT/ML ~~LOC~~ SOLN
30.0000 [IU] | Freq: Every day | SUBCUTANEOUS | Status: DC
  Administered 2017-10-09: 30 [IU] via SUBCUTANEOUS
  Filled 2017-10-09: qty 0.3

## 2017-10-09 MED ORDER — INSULIN ASPART 100 UNIT/ML ~~LOC~~ SOLN
0.0000 [IU] | Freq: Every day | SUBCUTANEOUS | Status: DC
  Administered 2017-10-09: 2 [IU] via SUBCUTANEOUS

## 2017-10-09 MED ORDER — PIPERACILLIN-TAZOBACTAM 3.375 G IVPB
3.3750 g | Freq: Three times a day (TID) | INTRAVENOUS | Status: DC
Start: 2017-10-09 — End: 2017-10-14
  Administered 2017-10-09 – 2017-10-14 (×13): 3.375 g via INTRAVENOUS
  Filled 2017-10-09 (×18): qty 50

## 2017-10-09 MED ORDER — ACETAMINOPHEN 650 MG RE SUPP: 650.0000 mg | Freq: Four times a day (QID) | RECTAL | Status: DC | PRN

## 2017-10-09 MED ORDER — PRAVASTATIN SODIUM 20 MG PO TABS
20.0000 mg | ORAL_TABLET | Freq: Every day | ORAL | Status: DC
  Administered 2017-10-09 – 2017-10-13 (×5): 20 mg via ORAL
  Filled 2017-10-09 (×5): qty 1

## 2017-10-09 MED ORDER — MORPHINE SULFATE (PF) 4 MG/ML IV SOLN: 4.0000 mg | Freq: Once | INTRAVENOUS | Status: DC

## 2017-10-09 MED ORDER — MORPHINE SULFATE (PF) 4 MG/ML IV SOLN: 2.0000 mg | INTRAVENOUS | Status: DC | PRN

## 2017-10-09 MED ORDER — AMLODIPINE BESYLATE 10 MG PO TABS
10.0000 mg | ORAL_TABLET | Freq: Every day | ORAL | Status: DC
  Administered 2017-10-10 – 2017-10-14 (×5): 10 mg via ORAL
  Filled 2017-10-09 (×4): qty 1
  Filled 2017-10-09: qty 2

## 2017-10-09 NOTE — ED Notes (Addendum)
Called in to room by pt's wife as monitors were alarming - states pt had not been transported to [facility] as tech stated he was on [employer] monitor - this nurse was not notified of same - US then notified by ED tech that pt was now not attached to [employer] as needed to be taken to [facility] - states currently have 2 pts on in their dept - will come for this pt as soon as they finish current pts

## 2017-10-09 NOTE — ED Notes (Signed)
Placed on pacing pads for monitoring - rationale explained to pt and family

## 2017-10-09 NOTE — ED Notes (Signed)
Dr Campos in to assess 

## 2017-10-09 NOTE — ED Notes (Signed)
Report called to 2C 

## 2017-10-09 NOTE — Progress Notes (Signed)
Dr. Lewayne BuntingGregg Taylor notified of tele rhythm (second degree Type II) w/rate in the high 30 - low 40s.  Dr. Ladona Ridgelaylor states "Do NOT pace this patient unless he passes out"; is aware of rhythm.  Zoll pads in place for emergency if needed.  Dr. Ladona Ridgelaylor is on-call for EP lab this evening.

## 2017-10-09 NOTE — ED Notes (Signed)
Placed on ccm showing sinus rhythm first degree AV block rate 40; reports "a little bit of chest tightness"; skin dark, warm, dry; family at bedside

## 2017-10-09 NOTE — ED Triage Notes (Signed)
[facility] for further eval of bradycardia; [facility] and abd [facility]; treated and released - [facility] this am instructing him to return to ED for "abnormal EKG"

## 2017-10-09 NOTE — ED Notes (Signed)
Spoke with Selena BattenKim in the main lab. Will run Lipase off of tube already in lab.

## 2017-10-09 NOTE — Progress Notes (Signed)
Dr. Jonah BlueJennifer Yates updated re: patient Temp 101.  No new orders.  PRN Tylenol is ordered and IV ATBs to be given as soon as received from pharmacy.

## 2017-10-09 NOTE — ED Notes (Signed)
Transported to [facility] via stretcher per rad tech 

## 2017-10-09 NOTE — ED Notes (Signed)
Resting quietly on stretcher with wife at bedside - ccm showing sinus brady with first degree AV block rate 42; skin dark, warm, dry

## 2017-10-09 NOTE — H&P (Signed)
History and Physical    Matthew Sellers:[id number] DOB: 11/06/1940 DOA: 10/09/2017  [facility] Consultants:  Annia Friendly - [facility], [facility] Patient coming [facility]: Home - lives with wife; Jackey Loge: Wife, [phone]  Chief Complaint: abdominal pain  HPI: Matthew Sellers is a 77 y.o. male with medical history significant of HTN; DM; HLD; CAD; and bradycardia presenting with upper abdominal pain.  Patient presented to the ER on Saturday for chest and abdominal pain.  +N/V.  In the ER, he was seen, had studies (CT, [place]), and discharged home.  They called him back the next morning due to "something wrong with the EKG and they coulnd't read it or whatever."  He waited until today and then went to the [place] and the [place] sent him here.   He was having stomach cramps, "like from bad food or something like that."  It is still uneasy, but somewhat better.  Started periumbilical, now midepigastric and RUQ.  At its worst, it was 9-10/10, currently 3-4/10.  He is no longer having N/V, has not had emesis since Saturday morning.  No fevers, although "he was pretty warm last night" - about 0430 he was given a pill for stomach upset.  He did not sleep well due to stomach pain last night.   His heart rate has been low for "quite a while."  Maybe as long as 12 years.  He did complain of some chest pressure on Saturday but none since.  He is light-headed/dizzy as soon as he stands up, and this has been happening for maybe over a year or up to 5-6 years.  ED Course:  ER 2 days ago with upper abdominal pain - [place] inconclusive.  May need a HIDA scan.  CTA 2 nights ago without other pathology.  [facility] reviewed the chart and was concerned about heart block and so sent him back to the ER.  It appears to be Mobitz type 2, prior records looked like complete heart block.  Intermittently light-headed and weak.  Chills and upper abdominal pain with ongoing nausea.  Review of Systems: As per HPI; otherwise  review of systems reviewed and negative.   Ambulatory Status:  Ambulates without assistance  Past Medical History:  Diagnosis Date  . Anemia   . Bradycardia   . CAD (coronary artery disease)   . Chest tightness   . Diabetes mellitus without complication (HCC)   . Dyslipidemia   . Dyspnea on exertion   . Hypertension   . Obesity   . Pneumonia     Past Surgical History:  Procedure Laterality Date  . ACHILLES TENDON SURGERY     for rupture  . CARDIAC CATHETERIZATION  07/08/2009   mild to mod . prox LAD 50% (Dr. Garen Lah)  . DOPPLER ECHOCARDIOGRAPHY  04/10/2008   LVEF 70-80% ,norm  . stress myoview  04/09/2008   EF 54%; LV  norm    Social History   Socioeconomic History  . Marital status: Married    Spouse name: Not on file  . Number of children: Not on file  . Years of education: Not on file  . Highest education level: Not on file  Occupational History  . Occupation: retired  Engineer, production  . Financial resource strain: Not on file  . Food insecurity:    Worry: Not on file    Inability: Not on file  . Transportation needs:    Medical: Not on file    Non-medical: Not on file  Tobacco Use  . Smoking status: Never Smoker  . Smokeless tobacco: Never Used  Substance and Sexual Activity  . Alcohol use: No    Alcohol/week: 0.0 oz  . Drug use: No  . Sexual activity: Not on file  Lifestyle  . Physical activity:    Days per week: Not on file    Minutes per session: Not on file  . Stress: Not on file  Relationships  . Social connections:    Talks on phone: Not on file    Gets together: Not on file    Attends religious service: Not on file    Active member of club or organization: Not on file    Attends meetings of clubs or organizations: Not on file    Relationship status: Not on file  . Intimate partner violence:    Fear of current or ex partner: Not on file    Emotionally abused: Not on file    Physically abused: Not on file    Forced sexual activity: Not on  file  Other Topics Concern  . Not on file  Social History Narrative  . Not on file    No Known Allergies  Family History  Problem Relation Age of Onset  . Leukemia Mother     Prior to Admission medications   Medication Sig Start Date End Date Taking? Authorizing Provider  amLODipine (NORVASC) 10 MG tablet Take 10 mg by mouth daily.   Yes [provider]  aspirin EC 81 MG tablet Take 81 mg by mouth daily.   Yes [provider]  bismuth subsalicylate (PEPTO BISMOL) 262 MG/15ML suspension Take 30 mLs by mouth as needed for indigestion.   Yes [provider]  Brinzolamide-Brimonidine 1-0.2 % SUSP Place 1 drop into both eyes 2 (two) times daily.   Yes [provider]  fluticasone (FLONASE) 50 MCG/ACT nasal spray Place 2 sprays into both nostrils daily as needed for allergies or rhinitis.   Yes [provider]  insulin glargine (LANTUS) 100 UNIT/ML injection Inject 30 Units into the skin at bedtime.    Yes [provider]  Boris Lown Oil 300 MG CAPS Take 300 mg by mouth daily.   Yes [provider]  latanoprost (XALATAN) 0.005 % ophthalmic solution Place 1 drop into both eyes at bedtime.   Yes [provider]  lisinopril-hydrochlorothiazide (PRINZIDE,ZESTORETIC) 20-25 MG per tablet Take 2 tablets by mouth daily.   Yes [provider]  metFORMIN (GLUCOPHAGE) 1000 MG tablet Take 1,000 mg by mouth 2 (two) times daily with a meal.   Yes [provider]  metoCLOPramide (REGLAN) 10 MG tablet Take 1 tablet (10 mg total) by mouth every 6 (six) hours. 10/08/17  Yes Upstill, Melvenia Beam, PA-C  nitroGLYCERIN (NITROSTAT) 0.4 MG SL tablet Place 0.4 mg under the tongue every 5 (five) minutes as needed for chest pain.   Yes [provider]  ondansetron (ZOFRAN ODT) 4 MG disintegrating tablet Take 1 tablet (4 mg total) by mouth every 8 (eight) hours as needed for nausea or vomiting. 04/17/16  Yes Melene Plan, DO  pravastatin  (PRAVACHOL) 20 MG tablet Take 20 mg by mouth at bedtime.   Yes [provider]  amoxicillin (AMOXIL) 500 MG capsule Take 2 capsules (1,000 mg total) by mouth 2 (two) times daily. Patient not taking: Reported on 10/07/2017 08/30/16   Dione Booze, MD  levofloxacin (LEVAQUIN) 750 MG tablet Take 1 tablet (750 mg total) by mouth daily. Patient not taking: Reported on 10/07/2017  06/30/14   Leroy SeaSingh, Prashant K, MD  potassium chloride SA (K-DUR,KLOR-CON) 20 MEQ tablet Take 1 tablet (20 mEq total) by mouth daily. Take one tablet twice a day for five days, then one tablet once a day Patient not taking: Reported on 10/07/2017 08/30/16   Dione BoozeGlick, David, MD    Physical Exam: Vitals:   10/09/17 1145 10/09/17 1200 10/09/17 1655 10/09/17 1729  BP: (!) 182/61 (!) 174/62 (!) 141/43   Pulse: (!) 42 (!) 42 (!) 46   Resp:   20   Temp:   (!) 100.7 F (38.2 C) (!) 101 F (38.3 C)  TempSrc:   Oral Oral  SpO2: 95% 95% 95%   Weight:    109.3 kg (240 lb 15.4 oz)  Height:    5\' 10"  (1.778 m)     General:  Appears calm and comfortable and is NAD Eyes:  PERRL, EOMI, normal lids, iris ENT:  grossly normal hearing, lips & tongue, mmm; appropriate dentition Neck:  no LAD, masses or thyromegaly; no carotid bruits Cardiovascular:  RR with significant bradycardia, no m/r/g. 1-2+ LE edema.  Respiratory:   CTA bilaterally with no wheezes/rales/rhonchi.  Normal respiratory effort. Abdomen:  soft, quite TTP in RUQ > midepigastric region, ND, NABS Skin:  no rash or induration seen on limited exam Musculoskeletal:  grossly normal tone BUE/BLE, good ROM, no bony abnormality Psychiatric:  grossly normal mood and affect, speech fluent and appropriate, AOx3 Neurologic:  CN 2-12 grossly intact, moves all extremities in coordinated fashion, sensation intact    Radiological Exams on Admission: Dg Chest 2 View  Result Date: 10/07/2017 CLINICAL DATA:  Vomiting EXAM: CHEST - 2 VIEW COMPARISON:  08/30/2016 FINDINGS: Mild  cardiomegaly. Aortic atherosclerosis. Both lungs are clear. The visualized skeletal structures are unremarkable. IMPRESSION: No active cardiopulmonary disease. Electronically Signed   By: Jasmine PangKim  Fujinaga M.D.   On: 10/07/2017 21:27   [place] Abdomen Limited  Result Date: 10/08/2017 CLINICAL DATA:  Right upper quadrant pain and vomiting for 4 hours. Gallstones seen on CT. EXAM: ULTRASOUND ABDOMEN LIMITED RIGHT UPPER QUADRANT COMPARISON:  CT chest 10/08/2017 FINDINGS: Examination is technically limited due to patient's body habitus and abdominal distention. Gallbladder: No discrete stones identified in the gallbladder. There is evidence of layering sludge. No wall thickening or edema. Murphy's sign is negative. Common bile duct: Diameter: 6.8 mm, normal Liver: Diffusely increased parenchymal echotexture suggesting fatty infiltration. No focal liver lesions are identified as visualized. Portal vein is patent on color Doppler imaging with normal direction of blood flow towards the liver. IMPRESSION: Layering sludge in the gallbladder. No stones. No additional changes to suggest cholecystitis. Diffuse fatty infiltration of the liver. Electronically Signed   By: Burman NievesWilliam  Stevens M.D.   On: 10/08/2017 02:52   Ct Angio Chest/abd/pel For Dissection W And/or W/wo  Result Date: 10/08/2017 CLINICAL DATA:  Vomiting with central dull chest pain EXAM: CT ANGIOGRAPHY CHEST, ABDOMEN AND PELVIS TECHNIQUE: Multidetector CT imaging through the chest, abdomen and pelvis was performed using the standard protocol during bolus administration of intravenous contrast. Multiplanar reconstructed images and MIPs were obtained and reviewed to evaluate the vascular anatomy. CONTRAST:  100mL ISOVUE-370 IOPAMIDOL (ISOVUE-370) INJECTION 76% COMPARISON:  Radiograph 10/07/2017, CT chest 06/01/2009 FINDINGS: CTA CHEST FINDINGS Cardiovascular: Non contrasted images of the chest demonstrate no intramural hematoma. Nonaneurysmal aorta. No dissection seen.  Mild aortic atherosclerosis. Minimal coronary vascular calcification. Borderline cardiomegaly. No pericardial effusion Mediastinum/Nodes: No enlarged mediastinal, hilar, or axillary lymph nodes. Thyroid gland, trachea, and esophagus demonstrate no  significant findings. Lungs/Pleura: Stable 4 mm right upper lobe subpleural pulmonary nodule. No acute consolidation or effusion. No pneumothorax. Musculoskeletal: No chest wall abnormality. No acute or significant osseous findings. Review of the MIP images confirms the above findings. CTA ABDOMEN AND PELVIS FINDINGS VASCULAR Aorta: Mild aortic atherosclerosis without aneurysm or dissection. No significant stenosis Celiac: Patent without evidence of aneurysm, dissection, vasculitis or significant stenosis. SMA: Patent without evidence of aneurysm, dissection, vasculitis or significant stenosis. Renals: Both renal arteries are patent without evidence of aneurysm, dissection, vasculitis, fibromuscular dysplasia or significant stenosis. IMA: Patent without evidence of aneurysm, dissection, vasculitis or significant stenosis. Inflow: Patent without evidence of aneurysm, dissection, vasculitis or significant stenosis. Review of the MIP images confirms the above findings. NON-VASCULAR Hepatobiliary: Calcified gallstones. No focal hepatic abnormality or biliary dilatation Pancreas: Unremarkable. No pancreatic ductal dilatation or surrounding inflammatory changes. Spleen: Normal in size without focal abnormality. Adrenals/Urinary Tract: Adrenal glands are unremarkable. Kidneys are normal, without renal calculi, focal lesion, or hydronephrosis. Bladder is unremarkable. Stomach/Bowel: Stomach is within normal limits. Appendix appears normal. No evidence of bowel wall thickening, distention, or inflammatory changes. Lymphatic: No significantly enlarged lymph nodes Reproductive: Prostate is unremarkable. Other: Fat in the left inguinal canal.  No free air or free fluid  Musculoskeletal: No acute or significant osseous findings. Review of the MIP images confirms the above findings. IMPRESSION: 1. Negative for aortic aneurysm or dissection. 2. No CT evidence for acute intra-abdominal or pelvic abnormality. 3. No significant vascular stenosis within the abdomen or pelvis. Mild aortic atherosclerosis 4. Gallstones Electronically Signed   By: Jasmine Pang M.D.   On: 10/08/2017 01:26   US Abdomen Limited Ruq  Result Date: 10/09/2017 CLINICAL DATA:  Right upper quadrant pain EXAM: ULTRASOUND ABDOMEN LIMITED RIGHT UPPER QUADRANT COMPARISON:  10/08/2017 FINDINGS: Gallbladder: Gallbladder is physiologically distended and contains small calculi biliary sludge along the dependent aspect. No wall thickening or pericholecystic fluid is identified. No sonographic Murphy sign noted by sonographer. Common bile duct: Diameter: 5.3 mm Liver: No focal lesion identified. Within normal limits in parenchymal echogenicity. Portal vein is patent on color Doppler imaging with normal direction of blood flow towards the liver. IMPRESSION: Cholelithiasis with biliary sludge is redemonstrated without secondary signs of acute cholecystitis. Electronically Signed   By: Tollie Eth M.D.   On: 10/09/2017 14:57    EKG: Independently reviewed.  Type 2 Mobitz block vs. complete AV block with rate 41; nonspecific ST changes with no evidence of acute ischemia   Labs on Admission: I have personally reviewed the available labs and imaging studies at the time of the admission.  Pertinent labs:   Glucose 223 BUN 25/Creatinine 1.61/GFR 47; 22/1.30/>60 Bili 1.9 Troponin 0.04 WBC 11.3 Hgb 11.3 TSH 2.703  Assessment/Plan Principal Problem:   Abdominal pain Active Problems:   Diabetes (HCC)   Hypertension   Hyperlipidemia   Fatty liver   CKD (chronic kidney disease) stage 3, GFR 30-59 ml/min (HCC)   Bradycardia   Abdominal pain -Patient with acute onset of abdominal pain several days  ago -Initial n/v, which has since resolved -Imaging indicates gallbladder sludge and gallstones without obvious acute cholecystitis -HIDA scan ordered and is pending -He did spike fever to 101 and has mild leukocytosis and so has 1 current SIRS criteria (leukocytosis is not quite elevated enough to count) -Will order lactate -Blood cultures are pending -Empiric treatment with Zosyn for now -Will order UA and urine culture, although this seems less likely as the source -Assuming patient has  cholecystitis as the source, he is likely to need IR-guided percutaneous cholecystostomy drainage since he is unlikely to qualify for general anesthesia and surgery given his bradycardia -He may need surgical consultation during this hospitalization, but the gallbladder as the source has not been proven yet  Bradycardia -Patient with high-grade heart block that is likely to require permanent pacemaker placement. -This has been chronic and asymptomatic but over the last (weeks? Months? Years?) has become mildly symptomatic -He now meets criteria for pacer but has an acute inflammatory and/or infectious process going on that needs to be evaluated first. -Cardiology is on board and is managing this issue. -For now, admission to SDU with pacer pads in place -Plan for pacemaker placement Wednesday (tentatively)  DM -Will check A1c -hold Glucophage -Continue Lantus -Cover with moderate-scale SSI  HTN -Continue home Norvasc -Hold Lisinopril-HCTZ due to CKD  HLD -Continue Pravachol  Fatty liver -Noticed on abdominal US -Minimally elevated bilirubin, possibly related to this vs. Gallbladder issue  CKD -Hold Lisinopril-HCTZ -Will trend   DVT prophylaxis: Lovenox  Code Status:  Full - confirmed with patient/family Family Communication: Wife and daughter were present throughout evaluation  Disposition Plan:  Home once clinically improved Consults called: Cardiology; likely to need IR or surgery  pending HIDA scan results  Admission status: Admit - It is my clinical opinion that admission to INPATIENT is reasonable and necessary because of the expectation that this patient will require hospital care that crosses at least 2 midnights to treat this condition based on the medical complexity of the problems presented.  Given the aforementioned information, the predictability of an adverse outcome is felt to be significant.    Jonah Blue MD [facility]  If note is complete, please contact covering daytime or nighttime physician. [email or web address] Password [email or web address]  10/09/2017, 6:01 PM

## 2017-10-09 NOTE — Consult Note (Addendum)
ELECTROPHYSIOLOGY CONSULT NOTE    Patient ID: [name] MRN: [id number], DOB/AGE: [birth year] 77 y.o.  Admit date: 10/09/2017 Date of Consult: 10/09/2017  Primary Physician: [facility]) Electrophysiologist: [place]  Patient Profile: Matthew Sellers is a 77 y.o. male with a history of diabetes, 2:1 AV block, hyperlipidemia, OSA on CPAP who is being seen today for the evaluation of heart block at the request of Dr Ophelia Charter.  HPI:  Matthew Sellers is a 77 y.o. male with the above past medical history. He has historically had relatively asymptomatic heart block but over the last few weeks has developed increased shortness of breath, exercise intolerance, and gait instability.  He presented to the hospital yesterday for evaluation of abdominal pain that extended into his chest. The pain has been constant. He has had some subjective fever and chills.  He has not had diarrhea but has had vomiting.  His EKG was reviewed as heart block and he was asked to come back to the ER today for further evaluation.  EP has been asked to evaluate for treatment options. No echo since 2010.    Past Medical History:  Diagnosis Date  . Anemia   . Bradycardia   . CAD (coronary artery disease)   . Chest tightness   . Diabetes mellitus without complication (HCC)   . Dyslipidemia   . Dyspnea on exertion   . Hypertension   . Obesity   . Pneumonia      Surgical History:  Past Surgical History:  Procedure Laterality Date  . ACHILLES TENDON SURGERY     for rupture  . CARDIAC CATHETERIZATION  07/08/2009   mild to mod . prox LAD 50% (Dr. Garen Lah)  . DOPPLER ECHOCARDIOGRAPHY  04/10/2008   LVEF 70-80% ,norm  . stress myoview  04/09/2008   EF 54%; LV  norm      Current Facility-Administered Medications:  .  morphine 4 MG/ML injection 4 mg, 4 mg, Intravenous, Once, Azalia Bilis, MD .  ondansetron [facility]) injection 4 mg, 4 mg, Intravenous, Once, Azalia Bilis, MD  Current  Outpatient Medications:  .  amLODipine (NORVASC) 10 MG tablet, Take 10 mg by mouth daily., Disp: , Rfl:  .  aspirin EC 81 MG tablet, Take 81 mg by mouth daily., Disp: , Rfl:  .  bismuth subsalicylate (PEPTO BISMOL) 262 MG/15ML suspension, Take 30 mLs by mouth as needed for indigestion., Disp: , Rfl:  .  Brinzolamide-Brimonidine 1-0.2 % SUSP, Place 1 drop into both eyes 2 (two) times daily., Disp: , Rfl:  .  fluticasone (FLONASE) 50 MCG/ACT nasal spray, Place 2 sprays into both nostrils daily as needed for allergies or rhinitis., Disp: , Rfl:  .  insulin glargine (LANTUS) 100 UNIT/ML injection, Inject 30 Units into the skin at bedtime. , Disp: , Rfl:  .  Krill Oil 300 MG CAPS, Take 300 mg by mouth daily., Disp: , Rfl:  .  latanoprost (XALATAN) 0.005 % ophthalmic solution, Place 1 drop into both eyes at bedtime., Disp: , Rfl:  .  lisinopril-hydrochlorothiazide (PRINZIDE,ZESTORETIC) 20-25 MG per tablet, Take 2 tablets by mouth daily., Disp: , Rfl:  .  metFORMIN (GLUCOPHAGE) 1000 MG tablet, Take 1,000 mg by mouth 2 (two) times daily with a meal., Disp: , Rfl:  .  metoCLOPramide (REGLAN) 10 MG tablet, Take 1 tablet (10 mg total) by mouth every 6 (six) hours., Disp: 30 tablet, Rfl: 0 .  nitroGLYCERIN (NITROSTAT) 0.4 MG SL tablet, Place 0.4 mg under the tongue  every 5 (five) minutes as needed for chest pain., Disp: , Rfl:  .  ondansetron (ZOFRAN ODT) 4 MG disintegrating tablet, Take 1 tablet (4 mg total) by mouth every 8 (eight) hours as needed for nausea or vomiting., Disp: 20 tablet, Rfl: 0 .  pravastatin (PRAVACHOL) 20 MG tablet, Take 20 mg by mouth at bedtime., Disp: , Rfl:  .  amoxicillin (AMOXIL) 500 MG capsule, Take 2 capsules (1,000 mg total) by mouth 2 (two) times daily. (Patient not taking: Reported on 10/07/2017), Disp: 40 capsule, Rfl: 0 .  levofloxacin (LEVAQUIN) 750 MG tablet, Take 1 tablet (750 mg total) by mouth daily. (Patient not taking: Reported on 10/07/2017), Disp: 5 tablet, Rfl: 0 .   potassium chloride SA (K-DUR,KLOR-CON) 20 MEQ tablet, Take 1 tablet (20 mEq total) by mouth daily. Take one tablet twice a day for five days, then one tablet once a day (Patient not taking: Reported on 10/07/2017), Disp: 35 tablet, Rfl: 0   Inpatient Medications:  .  morphine injection  4 mg Intravenous Once  . ondansetron (ZOFRAN) IV  4 mg Intravenous Once    Allergies: No Known Allergies  Social History   Socioeconomic History  . Marital status: Married    Spouse name: Not on file  . Number of children: Not on file  . Years of education: Not on file  . Highest education level: Not on file  Occupational History  . Not on file  Social Needs  . Financial resource strain: Not on file  . Food insecurity:    Worry: Not on file    Inability: Not on file  . Transportation needs:    Medical: Not on file    Non-medical: Not on file  Tobacco Use  . Smoking status: Never Smoker  . Smokeless tobacco: Never Used  Substance and Sexual Activity  . Alcohol use: No    Alcohol/week: 0.0 oz  . Drug use: No  . Sexual activity: Not on file  Lifestyle  . Physical activity:    Days per week: Not on file    Minutes per session: Not on file  . Stress: Not on file  Relationships  . Social connections:    Talks on phone: Not on file    Gets together: Not on file    Attends religious service: Not on file    Active member of club or organization: Not on file    Attends meetings of clubs or organizations: Not on file    Relationship status: Not on file  . Intimate partner violence:    Fear of current or ex partner: Not on file    Emotionally abused: Not on file    Physically abused: Not on file    Forced sexual activity: Not on file  Other Topics Concern  . Not on file  Social History Narrative  . Not on file     Family History  Problem Relation Age of Onset  . Leukemia Mother      Review of Systems: All other systems reviewed and are otherwise negative except as noted  above.  Physical Exam: Vitals:   10/09/17 1115 10/09/17 1130 10/09/17 1145 10/09/17 1200  BP: (!) 177/59 (!) 180/62 (!) 182/61 (!) 174/62  Pulse: (!) 42 (!) 42 (!) 42 (!) 42  Resp:      Temp:      TempSrc:      SpO2: 96% 96% 95% 95%  Weight:      Height:  GEN- The patient is well appearing, alert and oriented x 3 today.   HEENT: normocephalic, atraumatic; sclera clear, conjunctiva pink; hearing intact; oropharynx clear; neck supple Lungs- Clear to ausculation bilaterally, normal work of breathing.  No wheezes, rales, rhonchi Heart- Bradycardic regular rate and rhythm  GI- soft, non-tender, non-distended, bowel sounds present Extremities- no clubbing, cyanosis, 1+ BLE edema MS- no significant deformity or atrophy Skin- warm and dry, no rash or lesion Psych- euthymic mood, full affect Neuro- strength and sensation are intact  Labs:   Lab Results  Component Value Date   WBC 11.3 (H) 10/09/2017   HGB 11.3 (L) 10/09/2017   HCT 35.4 (L) 10/09/2017   MCV 89.4 10/09/2017   PLT 240 10/09/2017    Recent Labs  Lab 10/09/17 0938  NA 138  K 3.3*  CL 100  CO2 28  BUN 25*  CREATININE 1.61*  CALCIUM 9.1  PROT 6.6  BILITOT 1.9*  ALKPHOS 38  ALT 12  AST 20  GLUCOSE 223*      Radiology/Studies: Dg Chest 2 View  Result Date: 10/07/2017 CLINICAL DATA:  Vomiting EXAM: CHEST - 2 VIEW COMPARISON:  08/30/2016 FINDINGS: Mild cardiomegaly. Aortic atherosclerosis. Both lungs are clear. The visualized skeletal structures are unremarkable. IMPRESSION: No active cardiopulmonary disease. Electronically Signed   By: Jasmine PangKim  Fujinaga M.D.   On: 10/07/2017 21:27   [place] Abdomen Limited  Result Date: 10/08/2017 CLINICAL DATA:  Right upper quadrant pain and vomiting for 4 hours. Gallstones seen on CT. EXAM: ULTRASOUND ABDOMEN LIMITED RIGHT UPPER QUADRANT COMPARISON:  CT chest 10/08/2017 FINDINGS: Examination is technically limited due to patient's body habitus and abdominal distention.  Gallbladder: No discrete stones identified in the gallbladder. There is evidence of layering sludge. No wall thickening or edema. Murphy's sign is negative. Common bile duct: Diameter: 6.8 mm, normal Liver: Diffusely increased parenchymal echotexture suggesting fatty infiltration. No focal liver lesions are identified as visualized. Portal vein is patent on color Doppler imaging with normal direction of blood flow towards the liver. IMPRESSION: Layering sludge in the gallbladder. No stones. No additional changes to suggest cholecystitis. Diffuse fatty infiltration of the liver. Electronically Signed   By: Burman NievesWilliam  Stevens M.D.   On: 10/08/2017 02:52   Ct Angio Chest/abd/pel For Dissection W And/or W/wo  Result Date: 10/08/2017 CLINICAL DATA:  Vomiting with central dull chest pain EXAM: CT ANGIOGRAPHY CHEST, ABDOMEN AND PELVIS TECHNIQUE: Multidetector CT imaging through the chest, abdomen and pelvis was performed using the standard protocol during bolus administration of intravenous contrast. Multiplanar reconstructed images and MIPs were obtained and reviewed to evaluate the vascular anatomy. CONTRAST:  100mL ISOVUE-370 IOPAMIDOL (ISOVUE-370) INJECTION 76% COMPARISON:  Radiograph 10/07/2017, CT chest 06/01/2009 FINDINGS: CTA CHEST FINDINGS Cardiovascular: Non contrasted images of the chest demonstrate no intramural hematoma. Nonaneurysmal aorta. No dissection seen. Mild aortic atherosclerosis. Minimal coronary vascular calcification. Borderline cardiomegaly. No pericardial effusion Mediastinum/Nodes: No enlarged mediastinal, hilar, or axillary lymph nodes. Thyroid gland, trachea, and esophagus demonstrate no significant findings. Lungs/Pleura: Stable 4 mm right upper lobe subpleural pulmonary nodule. No acute consolidation or effusion. No pneumothorax. Musculoskeletal: No chest wall abnormality. No acute or significant osseous findings. Review of the MIP images confirms the above findings. CTA ABDOMEN AND PELVIS  FINDINGS VASCULAR Aorta: Mild aortic atherosclerosis without aneurysm or dissection. No significant stenosis Celiac: Patent without evidence of aneurysm, dissection, vasculitis or significant stenosis. SMA: Patent without evidence of aneurysm, dissection, vasculitis or significant stenosis. Renals: Both renal arteries are patent without evidence of aneurysm, dissection,  vasculitis, fibromuscular dysplasia or significant stenosis. IMA: Patent without evidence of aneurysm, dissection, vasculitis or significant stenosis. Inflow: Patent without evidence of aneurysm, dissection, vasculitis or significant stenosis. Review of the MIP images confirms the above findings. NON-VASCULAR Hepatobiliary: Calcified gallstones. No focal hepatic abnormality or biliary dilatation Pancreas: Unremarkable. No pancreatic ductal dilatation or surrounding inflammatory changes. Spleen: Normal in size without focal abnormality. Adrenals/Urinary Tract: Adrenal glands are unremarkable. Kidneys are normal, without renal calculi, focal lesion, or hydronephrosis. Bladder is unremarkable. Stomach/Bowel: Stomach is within normal limits. Appendix appears normal. No evidence of bowel wall thickening, distention, or inflammatory changes. Lymphatic: No significantly enlarged lymph nodes Reproductive: Prostate is unremarkable. Other: Fat in the left inguinal canal.  No free air or free fluid Musculoskeletal: No acute or significant osseous findings. Review of the MIP images confirms the above findings. IMPRESSION: 1. Negative for aortic aneurysm or dissection. 2. No CT evidence for acute intra-abdominal or pelvic abnormality. 3. No significant vascular stenosis within the abdomen or pelvis. Mild aortic atherosclerosis 4. Gallstones Electronically Signed   By: Jasmine Pang M.D.   On: 10/08/2017 01:26   US Abdomen Limited Ruq  Result Date: 10/09/2017 CLINICAL DATA:  Right upper quadrant pain EXAM: ULTRASOUND ABDOMEN LIMITED RIGHT UPPER QUADRANT  COMPARISON:  10/08/2017 FINDINGS: Gallbladder: Gallbladder is physiologically distended and contains small calculi biliary sludge along the dependent aspect. No wall thickening or pericholecystic fluid is identified. No sonographic Murphy sign noted by sonographer. Common bile duct: Diameter: 5.3 mm Liver: No focal lesion identified. Within normal limits in parenchymal echogenicity. Portal vein is patent on color Doppler imaging with normal direction of blood flow towards the liver. IMPRESSION: Cholelithiasis with biliary sludge is redemonstrated without secondary signs of acute cholecystitis. Electronically Signed   By: Tollie Eth M.D.   On: 10/09/2017 14:57    XWR:[id number] heart block, narrow QRS escape, alternating with 2:1 AV block (personally reviewed)  TELEMETRY: high grade heart block, rates 40's (personally reviewed)  Assessment/Plan: 1.  High grade heart block with narrow QRS escape He has been asymptomatic in the past but has developed increased dyspnea on exertion and exercise intolerance lately.  He meets indication for PPM implant.  However, he currently has an abdominal process with elevated WBC and subjective fever. Will allow work up to be completed and then proceed with elective pacemaker implantation  2.  Elevated troponin Will cycle enzymes tonight Update echo  3.  AKI Hold HCTZ BMET in am  4.  Abdominal pain Per primary team   Signed, Gypsy Balsam, NP 10/09/2017 3:49 PM   EP Attending  Patient seen and examined. Agree with above with minimal modification. The patient is hemodynamically stable and has high grade heart block. I think we need to rule out whatever abdominal process is going on. If nothing to be done percutaneously or surgically we will plan to place PPM in next 48-72 hours (likely Wednesday). Avoid AV nodal blocking drugs. I do not feel strongly about giving anti-biotics unless we have a clear infection/and source.  Leonia Reeves.D.

## 2017-10-10 ENCOUNTER — Inpatient Hospital Stay (HOSPITAL_COMMUNITY): Payer: Medicare Other

## 2017-10-10 ENCOUNTER — Other Ambulatory Visit (HOSPITAL_COMMUNITY): Payer: TRICARE For Life (TFL)

## 2017-10-10 LAB — GLUCOSE, CAPILLARY
GLUCOSE-CAPILLARY: 98 mg/dL (ref 70–99)
GLUCOSE-CAPILLARY: 109 mg/dL — AB (ref 70–99)
Glucose-Capillary: 67 mg/dL — ABNORMAL LOW (ref 70–99)

## 2017-10-10 LAB — CBC
HCT: 34.9 % — ABNORMAL LOW (ref 39.0–52.0)
Hemoglobin: 11 g/dL — ABNORMAL LOW (ref 13.0–17.0)
MCH: 28.3 pg (ref 26.0–34.0)
MCHC: 31.5 g/dL (ref 30.0–36.0)
MCV: 89.7 fL (ref 78.0–100.0)
PLATELETS: 249 10*3/uL (ref 150–400)
RBC: 3.89 MIL/uL — ABNORMAL LOW (ref 4.22–5.81)
RDW: 13.8 % (ref 11.5–15.5)
WBC: 9.6 10*3/uL (ref 4.0–10.5)

## 2017-10-10 LAB — MAGNESIUM: MAGNESIUM: 2 mg/dL (ref 1.7–2.4)

## 2017-10-10 LAB — BASIC METABOLIC PANEL
Anion gap: 13 (ref 5–15)
BUN: 24 mg/dL — ABNORMAL HIGH (ref 8–23)
CALCIUM: 9 mg/dL (ref 8.9–10.3)
CO2: 28 mmol/L (ref 22–32)
CREATININE: 1.61 mg/dL — AB (ref 0.61–1.24)
Chloride: 97 mmol/L — ABNORMAL LOW (ref 98–111)
GFR calc Af Amer: 47 mL/min — ABNORMAL LOW (ref >60)
GFR calc non Af Amer: 40 mL/min — ABNORMAL LOW (ref >60)
GLUCOSE: 98 mg/dL (ref 70–99)
Potassium: 2.8 mmol/L — ABNORMAL LOW (ref 3.5–5.1)
Sodium: 138 mmol/L (ref 135–145)

## 2017-10-10 LAB — TROPONIN I: Troponin I: 0.04 ng/mL (ref <0.03)

## 2017-10-10 MED ORDER — INSULIN GLARGINE 100 UNIT/ML ~~LOC~~ SOLN
20.0000 [IU] | Freq: Every day | SUBCUTANEOUS | Status: DC
  Administered 2017-10-10 – 2017-10-13 (×4): 20 [IU] via SUBCUTANEOUS
  Filled 2017-10-10 (×5): qty 0.2

## 2017-10-10 MED ORDER — INSULIN ASPART 100 UNIT/ML ~~LOC~~ SOLN
0.0000 [IU] | SUBCUTANEOUS | Status: DC
  Administered 2017-10-11: 5 [IU] via SUBCUTANEOUS
  Administered 2017-10-11: 2 [IU] via SUBCUTANEOUS
  Administered 2017-10-11: 3 [IU] via SUBCUTANEOUS
  Administered 2017-10-12: 5 [IU] via SUBCUTANEOUS
  Administered 2017-10-12 (×2): 3 [IU] via SUBCUTANEOUS
  Administered 2017-10-12 (×2): 5 [IU] via SUBCUTANEOUS
  Administered 2017-10-12 – 2017-10-13 (×2): 3 [IU] via SUBCUTANEOUS
  Administered 2017-10-13 (×2): 2 [IU] via SUBCUTANEOUS
  Administered 2017-10-13: 3 [IU] via SUBCUTANEOUS
  Administered 2017-10-13: 2 [IU] via SUBCUTANEOUS
  Administered 2017-10-13 – 2017-10-14 (×2): 3 [IU] via SUBCUTANEOUS
  Administered 2017-10-14: 2 [IU] via SUBCUTANEOUS

## 2017-10-10 MED ORDER — SODIUM CHLORIDE 0.9 % IV SOLN
INTRAVENOUS | Status: DC
  Administered 2017-10-10: 08:00:00 via INTRAVENOUS

## 2017-10-10 MED ORDER — MORPHINE SULFATE (PF) 4 MG/ML IV SOLN
3.0000 mg | Freq: Once | INTRAVENOUS | Status: AC
  Administered 2017-10-10: 3 mg via INTRAVENOUS

## 2017-10-10 MED ORDER — CHLORHEXIDINE GLUCONATE CLOTH 2 % EX PADS
6.0000 | MEDICATED_PAD | Freq: Once | CUTANEOUS | Status: AC
  Administered 2017-10-10: 6 via TOPICAL

## 2017-10-10 MED ORDER — TECHNETIUM TC 99M MEBROFENIN IV KIT
5.3000 | PACK | Freq: Once | INTRAVENOUS | Status: AC | PRN
  Administered 2017-10-10: 5.3 via INTRAVENOUS

## 2017-10-10 MED ORDER — POTASSIUM CHLORIDE 10 MEQ/100ML IV SOLN
10.0000 meq | INTRAVENOUS | Status: AC
  Administered 2017-10-10 (×4): 10 meq via INTRAVENOUS
  Filled 2017-10-10 (×4): qty 100

## 2017-10-10 MED ORDER — MORPHINE SULFATE (PF) 4 MG/ML IV SOLN
INTRAVENOUS | Status: AC
  Filled 2017-10-10: qty 1

## 2017-10-10 MED ORDER — CHLORHEXIDINE GLUCONATE CLOTH 2 % EX PADS
6.0000 | MEDICATED_PAD | Freq: Once | CUTANEOUS | Status: AC
  Administered 2017-10-11: 6 via TOPICAL

## 2017-10-10 MED ORDER — KCL IN DEXTROSE-NACL 20-5-0.45 MEQ/L-%-% IV SOLN
INTRAVENOUS | Status: DC
  Administered 2017-10-10 – 2017-10-12 (×7): via INTRAVENOUS
  Filled 2017-10-10 (×6): qty 1000

## 2017-10-10 NOTE — Consult Note (Addendum)
[facility] Surgery Consult Note  Matthew Sellers [birth year]  [id number].    Requesting MD: Estill Cotta Chief Complaint/Reason for Consult: acute cholecystitis  HPI:  Matthew Sellers is a 77yo male PMH DM type 2, HTN, HLD, CAD, CKD-III, and bradycardia admitted to [facility] 7/8 with RUQ pain, nausea, vomiting, dizziness, and chest discomfort since Saturday 7/6. Patient was bradycardic and found to have a high grade heart block for which cardiology is planning pacemaker placement.  Patient underwent CTA chest/abd/pelvis that was unremarkable. U/s showed cholelithiasis with biliary sludge without secondary signs of acute cholecystitis. HIDA positive for acute cholecystitis. Bilirubin 1.9 otherwise LFTs WNL.  Currently feeling ok. Continues to have RUQ pain, but it is somewhat better than yesterday. Denies any current n/v. He has been started on IV zosyn.  -Abdominal surgical history: none -Anticoagulants: none -Retired from [employer], lives at home with his wife  ROS: Review of Systems  Constitutional: Positive for fever and malaise/fatigue.  HENT: Negative.   Eyes: Negative.   Respiratory: Negative.   Cardiovascular: Negative.   Gastrointestinal: Positive for abdominal pain, nausea and vomiting.  Genitourinary: Negative.   Skin: Negative.   Neurological: Positive for dizziness.    All systems reviewed and otherwise negative except for as above  Family History  Problem Relation Age of Onset  . Leukemia Mother     Past Medical History:  Diagnosis Date  . Anemia   . Bradycardia   . CAD (coronary artery disease)   . Chest tightness   . Diabetes mellitus without complication ([place])   . Dyslipidemia   . Dyspnea on exertion   . Hypertension   . Obesity   . Pneumonia     Past Surgical History:  Procedure Laterality Date  . ACHILLES TENDON SURGERY     for rupture  . CARDIAC CATHETERIZATION  07/08/2009   mild to mod . prox LAD 50% (Dr. Felton Clinton)  . DOPPLER  ECHOCARDIOGRAPHY  04/10/2008   LVEF 70-80% ,norm  . stress myoview  04/09/2008   EF 54%; LV  norm    Social History:  reports that he has never smoked. He has never used smokeless tobacco. He reports that he does not drink alcohol or use drugs.  Allergies: No Known Allergies  Medications Prior to Admission  Medication Sig Dispense Refill  . amLODipine (NORVASC) 10 MG tablet Take 10 mg by mouth daily.    Marland Kitchen aspirin EC 81 MG tablet Take 81 mg by mouth daily.    Marland Kitchen bismuth subsalicylate (PEPTO BISMOL) 262 MG/15ML suspension Take 30 mLs by mouth as needed for indigestion.    . Brinzolamide-Brimonidine 1-0.2 % SUSP Place 1 drop into both eyes 2 (two) times daily.    . fluticasone (FLONASE) 50 MCG/ACT nasal spray Place 2 sprays into both nostrils daily as needed for allergies or rhinitis.    Marland Kitchen insulin glargine (LANTUS) 100 UNIT/ML injection Inject 30 Units into the skin at bedtime.     Javier Docker Oil 300 MG CAPS Take 300 mg by mouth daily.    Marland Kitchen latanoprost (XALATAN) 0.005 % ophthalmic solution Place 1 drop into both eyes at bedtime.    Marland Kitchen lisinopril-hydrochlorothiazide (PRINZIDE,ZESTORETIC) 20-25 MG per tablet Take 2 tablets by mouth daily.    . metFORMIN (GLUCOPHAGE) 1000 MG tablet Take 1,000 mg by mouth 2 (two) times daily with a meal.    . metoCLOPramide (REGLAN) 10 MG tablet Take 1 tablet (10 mg total) by mouth every 6 (six) hours. 30 tablet 0  .  nitroGLYCERIN (NITROSTAT) 0.4 MG SL tablet Place 0.4 mg under the tongue every 5 (five) minutes as needed for chest pain.    Marland Kitchen ondansetron (ZOFRAN ODT) 4 MG disintegrating tablet Take 1 tablet (4 mg total) by mouth every 8 (eight) hours as needed for nausea or vomiting. 20 tablet 0  . pravastatin (PRAVACHOL) 20 MG tablet Take 20 mg by mouth at bedtime.      Prior to Admission medications   Medication Sig Start Date End Date Taking? Authorizing Provider  amLODipine (NORVASC) 10 MG tablet Take 10 mg by mouth daily.   Yes [provider]  aspirin  EC 81 MG tablet Take 81 mg by mouth daily.   Yes [provider]  bismuth subsalicylate (PEPTO BISMOL) 262 MG/15ML suspension Take 30 mLs by mouth as needed for indigestion.   Yes [provider]  Brinzolamide-Brimonidine 1-0.2 % SUSP Place 1 drop into both eyes 2 (two) times daily.   Yes [provider]  fluticasone (FLONASE) 50 MCG/ACT nasal spray Place 2 sprays into both nostrils daily as needed for allergies or rhinitis.   Yes [provider]  insulin glargine (LANTUS) 100 UNIT/ML injection Inject 30 Units into the skin at bedtime.    Yes [provider]  Javier Docker Oil 300 MG CAPS Take 300 mg by mouth daily.   Yes [provider]  latanoprost (XALATAN) 0.005 % ophthalmic solution Place 1 drop into both eyes at bedtime.   Yes [provider]  lisinopril-hydrochlorothiazide (PRINZIDE,ZESTORETIC) 20-25 MG per tablet Take 2 tablets by mouth daily.   Yes [provider]  metFORMIN (GLUCOPHAGE) 1000 MG tablet Take 1,000 mg by mouth 2 (two) times daily with a meal.   Yes [provider]  metoCLOPramide (REGLAN) 10 MG tablet Take 1 tablet (10 mg total) by mouth every 6 (six) hours. 10/08/17  Yes Upstill, Nehemiah Settle, PA-C  nitroGLYCERIN (NITROSTAT) 0.4 MG SL tablet Place 0.4 mg under the tongue every 5 (five) minutes as needed for chest pain.   Yes [provider]  ondansetron (ZOFRAN ODT) 4 MG disintegrating tablet Take 1 tablet (4 mg total) by mouth every 8 (eight) hours as needed for nausea or vomiting. 04/17/16  Yes Deno Etienne, DO  pravastatin (PRAVACHOL) 20 MG tablet Take 20 mg by mouth at bedtime.   Yes [provider]    Blood pressure (!) 167/47, pulse (!) 50, temperature 99.8 F (37.7 C), temperature source Oral, resp. rate 12, height 5' 10"  (1.778 m), weight 240 lb 15.4 oz (109.3 kg), SpO2 (!) 89 %. Physical Exam: General: pleasant, WD/WN AA male who is laying in bed in NAD HEENT: head is normocephalic,  atraumatic.  Sclera are noninjected.  Pupils equal and round.  Ears and nose without any masses or lesions.  Mouth is pink and moist. Dentition fair Heart: bradycardic.  No obvious murmurs, gallops, or rubs noted.  Palpable pedal pulses bilaterally Lungs: CTAB, no wheezes, rhonchi, or rales noted.  Respiratory effort nonlabored Abd: soft, mild distension, TTP RUQ and epigastric region without rebound or guarding, +BS, no masses, hernias, or organomegaly MS: all 4 extremities are symmetrical with no cyanosis, clubbing, or edema. Skin: warm and dry with no masses, lesions, or rashes Psych: A&Ox3 with an appropriate affect. Neuro: cranial nerves grossly intact, extremity CSM intact bilaterally, normal speech  Results for orders placed or performed during the hospital encounter of 10/09/17 (from the past 48 hour(s))  CBC     Status: Abnormal   Collection Time: 10/09/17  9:38 AM  Result Value Ref Range   WBC 11.3 (H) 4.0 - 10.5 K/uL   RBC 3.96 (L) 4.22 - 5.81 MIL/uL   Hemoglobin 11.3 (L) 13.0 - 17.0 g/dL   HCT 35.4 (L) 39.0 - 52.0 %   MCV 89.4 78.0 - 100.0 fL   MCH 28.5 26.0 - 34.0 pg   MCHC 31.9 30.0 - 36.0 g/dL   RDW 13.6 11.5 - 15.5 %   Platelets 240 150 - 400 K/uL    Comment: Performed at [facility] [address], [place], [place] [address]  Comprehensive metabolic panel     Status: Abnormal   Collection Time: 10/09/17  9:38 AM  Result Value Ref Range   Sodium 138 135 - 145 mmol/L   Potassium 3.3 (L) 3.5 - 5.1 mmol/L   Chloride 100 98 - 111 mmol/L    Comment: Please note change in reference range.   CO2 28 22 - 32 mmol/L   Glucose, Bld 223 (H) 70 - 99 mg/dL    Comment: Please note change in reference range.   BUN 25 (H) 8 - 23 mg/dL    Comment: Please note change in reference range.   Creatinine, Ser 1.61 (H) 0.61 - 1.24 mg/dL   Calcium 9.1 8.9 - 10.3 mg/dL   Total Protein 6.6 6.5 - 8.1 g/dL   Albumin 3.6 3.5 - 5.0 g/dL   AST 20 15 - 41 U/L   ALT 12 0 - 44 U/L     Comment: Please note change in reference range.   Alkaline Phosphatase 38 38 - 126 U/L   Total Bilirubin 1.9 (H) 0.3 - 1.2 mg/dL   GFR calc non Af Amer 40 (L) >60 mL/min   GFR calc Af Amer 47 (L) >60 mL/min    Comment: (NOTE) The eGFR has been calculated using the CKD EPI equation. This calculation has not been validated in all clinical situations. eGFR's persistently <60 mL/min signify possible Chronic Kidney Disease.    Anion gap 10 5 - 15    Comment: Performed at [facility] [address] [place], [place] [address]  Troponin I     Status: Abnormal   Collection Time: 10/09/17  9:38 AM  Result Value Ref Range   Troponin I 0.04 (HH) <0.03 ng/mL    Comment: CRITICAL RESULT CALLED TO, READ BACK BY AND VERIFIED WITH: Bevelyn Ngo RN 1039 10/09/2017 BY A BENNETT Performed at [facility], [place] [address], [place], [place] [address]   Magnesium     Status: None   Collection Time: 10/09/17  9:38 AM  Result Value Ref Range   Magnesium 1.7 1.7 - 2.4 mg/dL    Comment: Performed at [facility] [address], [place], [place] [address]  Phosphorus     Status: None   Collection Time: 10/09/17  9:38 AM  Result Value Ref Range   Phosphorus 3.2 2.5 - 4.6 mg/dL    Comment: Performed at [facility] [address] [place], [place] [address]  Lipase, blood     Status: None   Collection Time: 10/09/17  9:38 AM  Result Value Ref Range   Lipase 36 11 - 51 U/L    Comment: Performed at [facility] [address], [place], [place] [address]  TSH     Status: None   Collection Time: 10/09/17 11:05 AM  Result Value Ref Range   TSH 2.703 0.350 - 4.500 uIU/mL  Comment: Performed by a 3rd Generation assay with a functional sensitivity of <=0.01 uIU/mL. Performed at [facility], [place] [address] [place], [place] [address]   MRSA PCR Screening     Status: None   Collection Time: 10/09/17  5:22 PM  Result Value Ref Range   MRSA by PCR NEGATIVE NEGATIVE     Comment:        The GeneXpert MRSA Assay (FDA approved for NASAL specimens only), is one component of a comprehensive MRSA colonization surveillance program. It is not intended to diagnose MRSA infection nor to guide or monitor treatment for MRSA infections. Performed at [facility], [place] [address] [place], [place] [address]   Troponin I (q 6hr x 3)     Status: Abnormal   Collection Time: 10/09/17  5:32 PM  Result Value Ref Range   Troponin I 0.03 (HH) <0.03 ng/mL    Comment: CRITICAL VALUE NOTED.  VALUE IS CONSISTENT WITH PREVIOUSLY REPORTED AND CALLED VALUE. Performed at [facility], [place] [address] [place], [place] [address]   Culture, blood (x 2)     Status: None (Preliminary result)   Collection Time: 10/09/17  6:36 PM  Result Value Ref Range   Specimen Description BLOOD LEFT ANTECUBITAL    Special Requests      BOTTLES DRAWN AEROBIC ONLY Blood Culture adequate volume   Culture      NO GROWTH < 24 HOURS Performed at [facility] [address], [place], [place] [address]    Report Status PENDING   Culture, blood (x 2)     Status: None (Preliminary result)   Collection Time: 10/09/17  6:36 PM  Result Value Ref Range   Specimen Description BLOOD LEFT HAND    Special Requests      BOTTLES DRAWN AEROBIC ONLY Blood Culture results may not be optimal due to an inadequate volume of blood received in culture bottles   Culture      NO GROWTH < 24 HOURS Performed at [facility] [address] [place], [place] [address]    Report Status PENDING   Lactic acid, plasma     Status: None   Collection Time: 10/09/17  6:36 PM  Result Value Ref Range   Lactic Acid, Venous 1.3 0.5 - 1.9 mmol/L    Comment: Performed at [facility] [address] [place], [place] [address]  Hemoglobin A1c     Status: Abnormal   Collection Time: 10/09/17  6:36 PM  Result Value Ref Range   Hgb A1c MFr Bld 7.5 (H) 4.8 - 5.6 %    Comment: (NOTE) Pre diabetes:           5.7%-6.4% Diabetes:              >6.4% Glycemic control for   <7.0% adults with diabetes    Mean Plasma Glucose 168.55 mg/dL    Comment: Performed at [facility] [address], [place], [place] [address]  Troponin I (q 6hr x 3)     Status: Abnormal   Collection Time: 10/09/17  9:49 PM  Result Value Ref Range   Troponin I 0.04 (HH) <0.03 ng/mL    Comment: CRITICAL VALUE NOTED.  VALUE IS CONSISTENT WITH PREVIOUSLY REPORTED AND CALLED VALUE. Performed at [facility], [place] [address] [place], [place] [address]   Lactic acid, plasma     Status: None   Collection Time: 10/09/17  9:49 PM  Result Value Ref Range   Lactic Acid, Venous 1.1 0.5 - 1.9 mmol/L    Comment: Performed at [facility] [address], [place], [place] [address]  Glucose, capillary     Status: Abnormal   Collection Time: 10/09/17 10:10 PM  Result Value Ref Range   Glucose-Capillary 205 (H) 70 - 99 mg/dL  Troponin I (q 6hr x 3)     Status: Abnormal   Collection Time: 10/10/17  4:13 AM  Result Value Ref Range   Troponin I 0.04 (HH) <0.03 ng/mL    Comment: CRITICAL VALUE NOTED.  VALUE IS CONSISTENT WITH PREVIOUSLY REPORTED AND CALLED VALUE. Performed at [facility], [place] [address], [place], [place] [address]   Basic metabolic panel     Status: Abnormal   Collection Time: 10/10/17  4:13 AM  Result Value Ref Range   Sodium 138 135 - 145 mmol/L   Potassium 2.8 (L) 3.5 - 5.1 mmol/L   Chloride 97 (L) 98 - 111 mmol/L    Comment: Please note change in reference range.   CO2 28 22 - 32 mmol/L   Glucose, Bld 98 70 - 99 mg/dL    Comment: Please note change in reference range.   BUN 24 (H) 8 - 23 mg/dL    Comment: Please note change in reference range.   Creatinine, Ser 1.61 (H) 0.61 - 1.24 mg/dL   Calcium 9.0 8.9 - 10.3 mg/dL   GFR calc non Af Amer 40 (L) >60 mL/min   GFR calc Af Amer 47 (L) >60 mL/min    Comment: (NOTE) The eGFR has been calculated using the CKD EPI equation. This  calculation has not been validated in all clinical situations. eGFR's persistently <60 mL/min signify possible Chronic Kidney Disease.    Anion gap 13 5 - 15    Comment: Performed at [facility] [address], [place], [place] [address]  CBC     Status: Abnormal   Collection Time: 10/10/17  4:13 AM  Result Value Ref Range   WBC 9.6 4.0 - 10.5 K/uL   RBC 3.89 (L) 4.22 - 5.81 MIL/uL   Hemoglobin 11.0 (L) 13.0 - 17.0 g/dL   HCT 34.9 (L) 39.0 - 52.0 %   MCV 89.7 78.0 - 100.0 fL   MCH 28.3 26.0 - 34.0 pg   MCHC 31.5 30.0 - 36.0 g/dL   RDW 13.8 11.5 - 15.5 %   Platelets 249 150 - 400 K/uL    Comment: Performed at [facility], [place] [address], [place], [place] [address]  Magnesium     Status: None   Collection Time: 10/10/17 11:28 AM  Result Value Ref Range   Magnesium 2.0 1.7 - 2.4 mg/dL    Comment: Performed at [facility] [address], [place],  [address]  Glucose, capillary     Status: Abnormal   Collection Time: 10/10/17 11:42 AM  Result Value Ref Range   Glucose-Capillary 67 (L) 70 - 99 mg/dL   Nm Hepato W/eject Fract  Result Date: 10/10/2017 CLINICAL DATA:  Chronic RIGHT UPPER quadrant abdominal pain which acutely worsened 3 days ago, associated with nausea and vomiting. EXAM: NUCLEAR MEDICINE HEPATOBILIARY IMAGING TECHNIQUE: Sequential images of the abdomen were obtained out to 90 minutes following intravenous administration of radiopharmaceutical. At 90 minutes, 3.0 mg of morphine were administered intravenously and imaging was performed for an additional 30 minutes. RADIOPHARMACEUTICALS:  5.3 mCi Tc-3mCholetec IV  COMPARISON:  No prior nuclear medicine hepatobiliary scan. RIGHT UPPER quadrant abdominal ultrasound yesterday which demonstrated gallbladder sludge but no sonographic evidence of acute cholecystitis. FINDINGS: Hepatic uptake of radiotracer is normal and there is prompt excretion into the biliary system. Small bowel activity is visible at  approximately 10 minutes. The gallbladder was not visible out to 90 minutes. There is no evidence of tracer retention by the liver. The gallbladder was not visible after intravenous administration of morphine. IMPRESSION: Nonvisualization of the gallbladder after intravenous morphine administration indicating cystic duct obstruction and therefore acute cholecystitis. These results will be called to the ordering clinician or representative by the Radiologist Assistant, and communication documented in the PACS or zVision Dashboard. Electronically Signed   By: Evangeline Dakin M.D.   On: 10/10/2017 12:19   US Abdomen Limited Ruq  Result Date: 10/09/2017 CLINICAL DATA:  Right upper quadrant pain EXAM: ULTRASOUND ABDOMEN LIMITED RIGHT UPPER QUADRANT COMPARISON:  10/08/2017 FINDINGS: Gallbladder: Gallbladder is physiologically distended and contains small calculi biliary sludge along the dependent aspect. No wall thickening or pericholecystic fluid is identified. No sonographic Murphy sign noted by sonographer. Common bile duct: Diameter: 5.3 mm Liver: No focal lesion identified. Within normal limits in parenchymal echogenicity. Portal vein is patent on color Doppler imaging with normal direction of blood flow towards the liver. IMPRESSION: Cholelithiasis with biliary sludge is redemonstrated without secondary signs of acute cholecystitis. Electronically Signed   By: Ashley Royalty M.D.   On: 10/09/2017 14:57   Anti-infectives (From admission, onward)   Start     Dose/Rate Route Frequency Ordered Stop   10/09/17 1800  piperacillin-tazobactam (ZOSYN) IVPB 3.375 g     3.375 g 12.5 mL/hr over 240 Minutes Intravenous Every 8 hours 10/09/17 1650        Assessment/Plan DM type 2, uncontrolled HTN CKD-III  Bradycardia with high-grade AV block - cardiology following, needs pacemaker placement this admission  Acute cholecystitis - abdominal pain, nausea, and vomiting since 7/6 - HIDA scan +  ID - zosyn  7/8>> VTE - SCDs, lovenox FEN - IVF, NPO Foley - none  Plan - Discussed case with Dr. Lucia Gaskins and Dr. Lovena Le of cardiology. Would be best to proceed with laparoscopic cholecystectomy first to control any source of infection prior to placing pacemaker.   Dr. Lovena Le will arrange for temporary pacer wirer placement first thing in the morning and we will plan for lap chole tomorrow afternoon. Keep NPO and continue IV zosyn.  Wellington Hampshire, [facility] Surgery 10/10/2017, 2:05 PM Pager: [phone] Consults: [phone]   Agree with above.  His wife was in the room with the patient.   He normally gets his medical care at the [facility] in [place].  Dr. Higinio Plan is his cardiologist and Dr. Duke Salvia his PCP.   Plan lap chole tomorrow (after temporary pacing)   I discussed with the patient the indications and risks of gall bladder surgery.  The primary risks of gall bladder surgery include, but are not limited to, bleeding, infection, common bile duct injury, and open surgery.  There is also the risk that the patient may have continued symptoms after surgery.  We discussed the typical post-operative recovery course. I tried to answer the patient's questions.  I gave the patient literature about gall bladder surgery.  Alphonsa Overall, MD, [facility] Surgery Pager: [phone] Office phone:  [phone]

## 2017-10-10 NOTE — Progress Notes (Signed)
Inpatient Diabetes Program Recommendations  AACE/ADA: New Consensus Statement on Inpatient Glycemic Control (2015)  Target Ranges:  Prepandial:   less than 140 mg/dL      Peak postprandial:   less than 180 mg/dL (1-2 hours)      Critically ill patients:  140 - 180 mg/dL   Lab Results  Component Value Date   GLUCAP 205 (H) 10/09/2017   HGBA1C 7.5 (H) 10/09/2017    Review of Glycemic Control Results for Matthew Sellers, Matthew Sellers (MRN [id number]) as of 10/10/2017 09:05  Ref. Range 10/08/2017 02:51 10/09/2017 22:10  Glucose-Capillary Latest Ref Range: 70 - 99 mg/dL 409230 (H) 811205 (H)   Diabetes history: Type 2 DM Outpatient Diabetes medications: Lantus 30 units QHS, Metformin 1000 mg BID Current orders for Inpatient glycemic control: Lantus 30 units QHS, Novolog 0-15 units TID, Novolog 0-5 units QHS  Inpatient Diabetes Program Recommendations:    Noted per abdominal US on 10/08/17, liver was suggestive of fatty infiltration. Per MD note, questionable cholecystitis. Noted HIDA scan results still pending to identify as source. Additionally, GFR <40. At this time, would not recommend restarting Metformin at discharge. It may be more appropriate to consider a GLP-1 (such as Victoza 0.6 mg Q/week or Trulicity 0.75 mg Q/week) or DDP 4 inhibitor (Januvia 25 mg QD or Tradjenta 5mg  QD) and have patient follow up with PCP.   Thanks, Lujean RaveLauren Axton Cihlar, MSN, RNC-OB Diabetes Coordinator [phone] (8a-5p)

## 2017-10-10 NOTE — Progress Notes (Addendum)
[facility]                                                                              Patient Demographics  Matthew Sellers, is a 77 y.o. male, DOB - [birth year], ZOX:[id number]  Admit date - 10/09/2017   Admitting Physician Matthew Blue, MD  Outpatient Primary MD for the patient is [facility]  Outpatient specialists:   LOS - 1  days   Medical records reviewed and are as summarized below:    Chief Complaint  Patient presents with  . Bradycardia       Brief summary   Please refer to admitting H&P for details  Briefly, patient is a 77 year old male with hypertension, diabetes, hyperlipidemia, CAD, bradycardia presented with upper abdominal pain.  Patient initially presented to the ER on Saturday, 7/7 for chest and abdominal pain with nausea and vomiting.  Patient had negative CTA chest and abdomen and was discharged home.  Patient continued to have abdominal cramps, started having severe periumbilical, mid epigastric and right upper quadrant abdominal pain.  Patient also complained of dizziness on standing up, chest pressure and low heart rate on 7/7.  There was a concern for possible heart block. Patient was admitted for further work-up.  Assessment & Plan    Principal Problem:   Abdominal pain with gallbladder sludge and gallstones, ?  Cholecystitis -Ultrasound abdomen showed gallbladder sludge, gallstones without obvious acute cholecystitis -Continue n.p.o., IV fluids, pain control -Continue IV Zosyn - GI and General surgery (d/w on call PA) consulted - HIDA scan showed nonvisualization of the gallbladder indicating cystic duct obstruction and acute cholecystitis  Active Problems:  Bradycardia with high-grade heart block -Initial EKG on 7/7 had shown heart block and patient was asked to come back to the ED -EP cardiology consulted, seen by Dr. Ladona Sellers, now symptomatic with increasing dyspnea on exertion and exercise intolerance, has high-grade  heart block with narrow QRS escape. -EP cardiology recommended pacemaker, planned tentatively on 7/10 -TSH 2.7    Diabetes (HCC) mellitus type II, uncontrolled with hypoglycemia -Hemoglobin A1c 7.5 - decrease Lantus to 20 units, continue sliding scale insulin moderate every 4 hours, moderate, currently n.p.o. -Continue D5 half normal with K supplementation   Hypokalemia  -Replaced IV, magnesium 2.0    Hypertension -Continue Norvasc, hold lisinopril, HCTZ due to renal insufficiency  Mild acute on CKD (chronic kidney disease) stage 3, GFR 30-59 ml/min (HCC) -Baseline creatinine 1.2-1.3, admitted with creatinine of 1.6 -Continue IV fluid hydration -Hold lisinopril HCTZ    Code Status: Full CODE STATUS DVT Prophylaxis:  Lovenox  Family Communication: Discussed in detail with the patient, all imaging results, lab results explained to the patient and wife at bedside    Disposition Plan:   Time Spent in minutes   35 minutes  Procedures:  Ultrasound abdomen HIDA  Consultants:   EP cardiology General surgery GI  Antimicrobials:   IV Zosyn 7/8   Medications  Scheduled Meds: . amLODipine  10 mg Oral Daily  . aspirin EC  81 mg Oral Daily  . Brinzolamide-Brimonidine  1 drop Ophthalmic BID  . enoxaparin (LOVENOX) injection  40 mg Subcutaneous  Q24H  . insulin aspart  0-15 Units Subcutaneous TID WC  . insulin aspart  0-5 Units Subcutaneous QHS  . insulin glargine  30 Units Subcutaneous QHS  . latanoprost  1 drop Both Eyes QHS  . metoCLOPramide  10 mg Oral Q6H  .  morphine injection  4 mg Intravenous Once  . morphine      . ondansetron (ZOFRAN) IV  4 mg Intravenous Once  . pravastatin  20 mg Oral QHS  . sodium chloride flush  3 mL Intravenous Q12H   Continuous Infusions: . sodium chloride 100 mL/hr at 10/10/17 0756  . piperacillin-tazobactam (ZOSYN)  IV 3.375 g (10/10/17 0520)  . potassium chloride 10 mEq (10/10/17 0800)   PRN Meds:.acetaminophen **OR**  acetaminophen, morphine injection, ondansetron **OR** ondansetron (ZOFRAN) IV   Antibiotics   Anti-infectives (From admission, onward)   Start     Dose/Rate Route Frequency Ordered Stop   10/09/17 1800  piperacillin-tazobactam (ZOSYN) IVPB 3.375 g     3.375 g 12.5 mL/hr over 240 Minutes Intravenous Every 8 hours 10/09/17 1650          Subjective:   Matthew Sellers was seen and examined today.  Still having abdominal pain right upper quadrant 7/10.  Temp of 100 F.  No nausea or vomiting.  No chest pain or shortness of breath.    Objective:   Vitals:   10/09/17 2300 10/10/17 0000 10/10/17 0200 10/10/17 0700  BP:    (!) 160/59  Pulse: (!) 39 (!) 40 (!) 43 (!) 35  Resp: 18 (!) 29 (!) 22 15  Temp:    100 F (37.8 C)  TempSrc:    Oral  SpO2: 97% 94% 98% 95%  Weight:      Height:        Intake/Output Summary (Last 24 hours) at 10/10/2017 1026 Last data filed at 10/09/2017 2300 Gross per 24 hour  Intake 301.56 ml  Output 250 ml  Net 51.56 ml     Wt Readings from Last 3 Encounters:  10/09/17 109.3 kg (240 lb 15.4 oz)  08/30/16 83.9 kg (185 lb)  06/30/14 110.4 kg (243 lb 6.2 oz)     Exam  General: Alert and oriented x 3, NAD  Eyes:  HEENT:  Atraumatic, normocephalic  Cardiovascular: S1 S2 auscultated,. Regular rate and rhythm.  Respiratory: Clear to auscultation bilaterally, no wheezing, rales or rhonchi  Gastrointestinal: Soft, RUQ and epigastric TTP nondistended, + bowel sounds  Ext: no pedal edema bilaterally  Neuro:  no new FND's  musculoskeletal: No digital cyanosis, clubbing  Skin: No rashes  Psych: Normal affect and demeanor, alert and oriented x3    Data Reviewed:  I have personally reviewed following labs and imaging studies  Micro Results Recent Results (from the past 240 hour(s))  MRSA PCR Screening     Status: None   Collection Time: 10/09/17  5:22 PM  Result Value Ref Range Status   MRSA by PCR NEGATIVE NEGATIVE Final    Comment:         The GeneXpert MRSA Assay (FDA approved for NASAL specimens only), is one component of a comprehensive MRSA colonization surveillance program. It is not intended to diagnose MRSA infection nor to guide or monitor treatment for MRSA infections. Performed at [facility], [address] [place], [place] [address]     Radiology Reports Dg Chest 2 View  Result Date: 10/07/2017 CLINICAL DATA:  Vomiting EXAM: CHEST - 2 VIEW COMPARISON:  08/30/2016 FINDINGS: Mild cardiomegaly.  Aortic atherosclerosis. Both lungs are clear. The visualized skeletal structures are unremarkable. IMPRESSION: No active cardiopulmonary disease. Electronically Signed   By: Jasmine Pang M.D.   On: 10/07/2017 21:27   US Abdomen Limited  Result Date: 10/08/2017 CLINICAL DATA:  Right upper quadrant pain and vomiting for 4 hours. Gallstones seen on CT. EXAM: ULTRASOUND ABDOMEN LIMITED RIGHT UPPER QUADRANT COMPARISON:  CT chest 10/08/2017 FINDINGS: Examination is technically limited due to patient's body habitus and abdominal distention. Gallbladder: No discrete stones identified in the gallbladder. There is evidence of layering sludge. No wall thickening or edema. Murphy's sign is negative. Common bile duct: Diameter: 6.8 mm, normal Liver: Diffusely increased parenchymal echotexture suggesting fatty infiltration. No focal liver lesions are identified as visualized. Portal vein is patent on color Doppler imaging with normal direction of blood flow towards the liver. IMPRESSION: Layering sludge in the gallbladder. No stones. No additional changes to suggest cholecystitis. Diffuse fatty infiltration of the liver. Electronically Signed   By: Burman Nieves M.D.   On: 10/08/2017 02:52   Ct Angio Chest/abd/pel For Dissection W And/or W/wo  Result Date: 10/08/2017 CLINICAL DATA:  Vomiting with central dull chest pain EXAM: CT ANGIOGRAPHY CHEST, ABDOMEN AND PELVIS TECHNIQUE: Multidetector CT imaging through the chest, abdomen  and pelvis was performed using the standard protocol during bolus administration of intravenous contrast. Multiplanar reconstructed images and MIPs were obtained and reviewed to evaluate the vascular anatomy. CONTRAST:  ISOVUE-370 IOPAMIDOL (ISOVUE-370) INJECTION 76% COMPARISON:  Radiograph 10/07/2017, CT chest 06/01/2009 FINDINGS: CTA CHEST FINDINGS Cardiovascular: Non contrasted images of the chest demonstrate no intramural hematoma. Nonaneurysmal aorta. No dissection seen. Mild aortic atherosclerosis. Minimal coronary vascular calcification. Borderline cardiomegaly. No pericardial effusion Mediastinum/Nodes: No enlarged mediastinal, hilar, or axillary lymph nodes. Thyroid gland, trachea, and esophagus demonstrate no significant findings. Lungs/Pleura: Stable 4 mm right upper lobe subpleural pulmonary nodule. No acute consolidation or effusion. No pneumothorax. Musculoskeletal: No chest wall abnormality. No acute or significant osseous findings. Review of the MIP images confirms the above findings. CTA ABDOMEN AND PELVIS FINDINGS VASCULAR Aorta: Mild aortic atherosclerosis without aneurysm or dissection. No significant stenosis Celiac: Patent without evidence of aneurysm, dissection, vasculitis or significant stenosis. SMA: Patent without evidence of aneurysm, dissection, vasculitis or significant stenosis. Renals: Both renal arteries are patent without evidence of aneurysm, dissection, vasculitis, fibromuscular dysplasia or significant stenosis. IMA: Patent without evidence of aneurysm, dissection, vasculitis or significant stenosis. Inflow: Patent without evidence of aneurysm, dissection, vasculitis or significant stenosis. Review of the MIP images confirms the above findings. NON-VASCULAR Hepatobiliary: Calcified gallstones. No focal hepatic abnormality or biliary dilatation Pancreas: Unremarkable. No pancreatic ductal dilatation or surrounding inflammatory changes. Spleen: Normal in size without focal  abnormality. Adrenals/Urinary Tract: Adrenal glands are unremarkable. Kidneys are normal, without renal calculi, focal lesion, or hydronephrosis. Bladder is unremarkable. Stomach/Bowel: Stomach is within normal limits. Appendix appears normal. No evidence of bowel wall thickening, distention, or inflammatory changes. Lymphatic: No significantly enlarged lymph nodes Reproductive: Prostate is unremarkable. Other: Fat in the left inguinal canal.  No free air or free fluid Musculoskeletal: No acute or significant osseous findings. Review of the MIP images confirms the above findings. IMPRESSION: 1. Negative for aortic aneurysm or dissection. 2. No CT evidence for acute intra-abdominal or pelvic abnormality. 3. No significant vascular stenosis within the abdomen or pelvis. Mild aortic atherosclerosis 4. Gallstones Electronically Signed   By: Jasmine Pang M.D.   On: 10/08/2017 01:26   US Abdomen Limited Ruq  Result Date: 10/09/2017 CLINICAL DATA:  Right upper quadrant pain EXAM: ULTRASOUND ABDOMEN LIMITED RIGHT UPPER QUADRANT COMPARISON:  10/08/2017 FINDINGS: Gallbladder: Gallbladder is physiologically distended and contains small calculi biliary sludge along the dependent aspect. No wall thickening or pericholecystic fluid is identified. No sonographic Murphy sign noted by sonographer. Common bile duct: Diameter: 5.3 mm Liver: No focal lesion identified. Within normal limits in parenchymal echogenicity. Portal vein is patent on color Doppler imaging with normal direction of blood flow towards the liver. IMPRESSION: Cholelithiasis with biliary sludge is redemonstrated without secondary signs of acute cholecystitis. Electronically Signed   By: Tollie Ethavid  Kwon M.D.   On: 10/09/2017 14:57    Lab Data:  CBC: Recent Labs  Lab 10/07/17 2115 10/09/17 0938 10/10/17 0413  WBC 7.7 11.3* 9.6  HGB 12.0* 11.3* 11.0*  HCT 38.3* 35.4* 34.9*  MCV 90.3 89.4 89.7  PLT 289 240 249   Basic Metabolic Panel: Recent Labs  Lab  10/07/17 2115 10/09/17 0938 10/10/17 0413  NA 138 138 138  K 3.4* 3.3* 2.8*  CL 102 100 97*  CO2 25 28 28   GLUCOSE 233* 223* 98  BUN 22 25* 24*  CREATININE 1.30* 1.61* 1.61*  CALCIUM 9.9 9.1 9.0  MG  --  1.7  --   PHOS  --  3.2  --    GFR: Estimated Creatinine Clearance: 49.1 mL/min (A) (by C-G formula based on SCr of 1.61 mg/dL (H)). Liver Function Tests: Recent Labs  Lab 10/08/17 0134 10/09/17 0938  AST 17 20  ALT 14 12  ALKPHOS 37* 38  BILITOT 1.1 1.9*  PROT 7.0 6.6  ALBUMIN 4.0 3.6   Recent Labs  Lab 10/09/17 0938  LIPASE 36   No results for input(s): AMMONIA in the last 168 hours. Coagulation Profile: No results for input(s): INR, PROTIME in the last 168 hours. Cardiac Enzymes: Recent Labs  Lab 10/09/17 0938 10/09/17 1732 10/09/17 2149 10/10/17 0413  TROPONINI 0.04* 0.03* 0.04* 0.04*   BNP (last 3 results) No results for input(s): PROBNP in the last 8760 hours. HbA1C: Recent Labs    10/09/17 1836  HGBA1C 7.5*   CBG: Recent Labs  Lab 10/08/17 0251 10/09/17 2210  GLUCAP 230* 205*   Lipid Profile: No results for input(s): CHOL, HDL, LDLCALC, TRIG, CHOLHDL, LDLDIRECT in the last 72 hours. Thyroid Function Tests: Recent Labs    10/09/17 1105  TSH 2.703   Anemia Panel: No results for input(s): VITAMINB12, FOLATE, FERRITIN, TIBC, IRON, RETICCTPCT in the last 72 hours. Urine analysis:    Component Value Date/Time   COLORURINE YELLOW 08/30/2016 0304   APPEARANCEUR CLEAR 08/30/2016 0304   LABSPEC 1.024 08/30/2016 0304   PHURINE 5.0 08/30/2016 0304   GLUCOSEU 150 (A) 08/30/2016 0304   HGBUR NEGATIVE 08/30/2016 0304   BILIRUBINUR NEGATIVE 08/30/2016 0304   KETONESUR 5 (A) 08/30/2016 0304   PROTEINUR 30 (A) 08/30/2016 0304   UROBILINOGEN 1.0 06/30/2014 0209   NITRITE NEGATIVE 08/30/2016 0304   LEUKOCYTESUR NEGATIVE 08/30/2016 0304     Rikki Smestad M.D. [facility] 10/10/2017, 10:26 AM  Pager: [phone] Between 7am to 7pm - call  Pager - [phone]  After 7pm go to [email or web address] - password [email or web address]  Call night coverage person covering after 7pm

## 2017-10-10 NOTE — ED Provider Notes (Signed)
[facility] PROGRESSIVE CARE Provider Note   CSN: [id number] Arrival date & time: 10/09/17  2440     History   Chief Complaint Chief Complaint  Patient presents with  . Bradycardia    HPI Matthew Sellers is a 77 y.o. male.  HPI 77 year old male who presents the emergency department with ongoing upper and right upper quadrant abdominal pain.  Seen in the emergency department 2 days ago for same and underwent CT angios chest abdomen pelvis without acute pathology found.  He states he is continued to have low-grade fevers and ongoing upper abdominal discomfort.  He was also found to have significant bradycardia while in the hospital 2 days ago and was called by the provider back after further review of the EKG after the patient been discharged.  He was encouraged to return to the ER for what looked to be high-grade AV block.  Patient reports some lightheadedness without syncope.  He had chest tightness 2 nights ago but reports no chest discomfort at this time.  He is more focused on his upper abdominal pain.  He has had mild nausea and anorexia.  The electronic medical record he does not appear to be on AV nodal blockade.  He presents with a heart rate of 42     Past Medical History:  Diagnosis Date  . Anemia   . Bradycardia   . CAD (coronary artery disease)   . Chest tightness   . Diabetes mellitus without complication (HCC)   . Dyslipidemia   . Dyspnea on exertion   . Hypertension   . Obesity   . Pneumonia     Patient Active Problem List   Diagnosis Date Noted  . Abdominal pain 10/09/2017  . Fatty liver 10/09/2017  . CKD (chronic kidney disease) stage 3, GFR 30-59 ml/min (HCC) 10/09/2017  . Bradycardia 10/09/2017  . CAP (community acquired pneumonia) 06/29/2014  . Diabetes (HCC) 06/29/2014  . Hypertension 06/29/2014  . Hyperlipidemia 06/29/2014  . Hypokalemia 06/29/2014  . Anemia 06/29/2014  . Pneumonia 06/29/2014    Past Surgical History:  Procedure  Laterality Date  . ACHILLES TENDON SURGERY     for rupture  . CARDIAC CATHETERIZATION  07/08/2009   mild to mod . prox LAD 50% (Dr. Garen Lah)  . DOPPLER ECHOCARDIOGRAPHY  04/10/2008   LVEF 70-80% ,norm  . stress myoview  04/09/2008   EF 54%; LV  norm        Home Medications    Prior to Admission medications   Medication Sig Start Date End Date Taking? Authorizing Provider  amLODipine (NORVASC) 10 MG tablet Take 10 mg by mouth daily.   Yes [provider]  aspirin EC 81 MG tablet Take 81 mg by mouth daily.   Yes [provider]  bismuth subsalicylate (PEPTO BISMOL) 262 MG/15ML suspension Take 30 mLs by mouth as needed for indigestion.   Yes [provider]  Brinzolamide-Brimonidine 1-0.2 % SUSP Place 1 drop into both eyes 2 (two) times daily.   Yes [provider]  fluticasone (FLONASE) 50 MCG/ACT nasal spray Place 2 sprays into both nostrils daily as needed for allergies or rhinitis.   Yes [provider]  insulin glargine (LANTUS) 100 UNIT/ML injection Inject 30 Units into the skin at bedtime.    Yes [provider]  Boris Lown Oil 300 MG CAPS Take 300 mg by mouth daily.   Yes [provider]  latanoprost (XALATAN) 0.005 % ophthalmic solution Place 1 drop into both eyes  at bedtime.   Yes [provider]  lisinopril-hydrochlorothiazide (PRINZIDE,ZESTORETIC) 20-25 MG per tablet Take 2 tablets by mouth daily.   Yes [provider]  metFORMIN (GLUCOPHAGE) 1000 MG tablet Take 1,000 mg by mouth 2 (two) times daily with a meal.   Yes [provider]  metoCLOPramide (REGLAN) 10 MG tablet Take 1 tablet (10 mg total) by mouth every 6 (six) hours. 10/08/17  Yes Upstill, Melvenia BeamShari, PA-C  nitroGLYCERIN (NITROSTAT) 0.4 MG SL tablet Place 0.4 mg under the tongue every 5 (five) minutes as needed for chest pain.   Yes [provider]  ondansetron (ZOFRAN ODT) 4 MG disintegrating tablet Take 1 tablet (4 mg total) by  mouth every 8 (eight) hours as needed for nausea or vomiting. 04/17/16  Yes Melene PlanFloyd, Dan, DO  pravastatin (PRAVACHOL) 20 MG tablet Take 20 mg by mouth at bedtime.   Yes [provider]    Family History Family History  Problem Relation Age of Onset  . Leukemia Mother     Social History Social History   Tobacco Use  . Smoking status: Never Smoker  . Smokeless tobacco: Never Used  Substance Use Topics  . Alcohol use: No    Alcohol/week: 0.0 oz  . Drug use: No     Allergies   Patient has no known allergies.   Review of Systems Review of Systems  All other systems reviewed and are negative.    Physical Exam Updated Vital Signs BP (!) 164/51 (BP Location: Left Arm)   Pulse (!) 36   Temp 99.8 F (37.7 C) (Oral)   Resp (!) 27   Ht 5\' 10"  (1.778 m)   Wt 109.3 kg (240 lb 15.4 oz)   SpO2 95%   BMI 34.57 kg/m   Physical Exam  Constitutional: He is oriented to person, place, and time. He appears well-developed and well-nourished.  HENT:  Head: Normocephalic.  Eyes: EOM are normal.  Neck: Normal range of motion.  Cardiovascular: Normal heart sounds and intact distal pulses.  bradycardia  Pulmonary/Chest: Effort normal and breath sounds normal.  Abdominal: He exhibits no distension.  Mild right upper quadrant pain  Musculoskeletal: Normal range of motion.  Neurological: He is alert and oriented to person, place, and time.  Psychiatric: He has a normal mood and affect.  Nursing note and vitals reviewed.    ED Treatments / Results  Labs (all labs ordered are listed, but only abnormal results are displayed) Labs Reviewed  CBC - Abnormal; Notable for the following components:      Result Value   WBC 11.3 (*)    RBC 3.96 (*)    Hemoglobin 11.3 (*)    HCT 35.4 (*)    All other components within normal limits  COMPREHENSIVE METABOLIC PANEL - Abnormal; Notable for the following components:   Potassium 3.3 (*)    Glucose, Bld 223 (*)    BUN 25 (*)     Creatinine, Ser 1.61 (*)    Total Bilirubin 1.9 (*)    GFR calc non Af Amer 40 (*)    GFR calc Af Amer 47 (*)    All other components within normal limits  TROPONIN I - Abnormal; Notable for the following components:   Troponin I 0.04 (*)    All other components within normal limits  TROPONIN I - Abnormal; Notable for the following components:   Troponin I 0.03 (*)    All other components within normal limits  TROPONIN I - Abnormal; Notable for  the following components:   Troponin I 0.04 (*)    All other components within normal limits  TROPONIN I - Abnormal; Notable for the following components:   Troponin I 0.04 (*)    All other components within normal limits  BASIC METABOLIC PANEL - Abnormal; Notable for the following components:   Potassium 2.8 (*)    Chloride 97 (*)    BUN 24 (*)    Creatinine, Ser 1.61 (*)    GFR calc non Af Amer 40 (*)    GFR calc Af Amer 47 (*)    All other components within normal limits  CBC - Abnormal; Notable for the following components:   RBC 3.89 (*)    Hemoglobin 11.0 (*)    HCT 34.9 (*)    All other components within normal limits  HEMOGLOBIN A1C - Abnormal; Notable for the following components:   Hgb A1c MFr Bld 7.5 (*)    All other components within normal limits  GLUCOSE, CAPILLARY - Abnormal; Notable for the following components:   Glucose-Capillary 205 (*)    All other components within normal limits  GLUCOSE, CAPILLARY - Abnormal; Notable for the following components:   Glucose-Capillary 67 (*)    All other components within normal limits  MRSA PCR SCREENING  CULTURE, BLOOD (ROUTINE X 2)  CULTURE, BLOOD (ROUTINE X 2)  URINE CULTURE  MAGNESIUM  PHOSPHORUS  TSH  LIPASE, BLOOD  LACTIC ACID, PLASMA  LACTIC ACID, PLASMA  MAGNESIUM  GLUCOSE, CAPILLARY  URINALYSIS, ROUTINE W REFLEX MICROSCOPIC  HEMOGLOBIN A1C    EKG EKG Interpretation  Date/Time:  Monday October 09 2017 09:26:31 EDT Ventricular Rate:  41 PR Interval:    QRS  Duration: 92 QT Interval:  437 QTC Calculation: 361 R Axis:   36 Text Interpretation:  AV block, complete (third degree) Nonspecific repol abnormality, diffuse leads Confirmed by Rolan Bucco [phone]) on 10/10/2017 8:05:02 AM   Radiology  US Abdomen Limited Ruq  Result Date: 10/09/2017 CLINICAL DATA:  Right upper quadrant pain EXAM: ULTRASOUND ABDOMEN LIMITED RIGHT UPPER QUADRANT COMPARISON:  10/08/2017 FINDINGS: Gallbladder: Gallbladder is physiologically distended and contains small calculi biliary sludge along the dependent aspect. No wall thickening or pericholecystic fluid is identified. No sonographic Murphy sign noted by sonographer. Common bile duct: Diameter: 5.3 mm Liver: No focal lesion identified. Within normal limits in parenchymal echogenicity. Portal vein is patent on color Doppler imaging with normal direction of blood flow towards the liver. IMPRESSION: Cholelithiasis with biliary sludge is redemonstrated without secondary signs of acute cholecystitis. Electronically Signed   By: Tollie Eth M.D.   On: 10/09/2017 14:57    Procedures .Critical Care Performed by: Azalia Bilis, MD Authorized by: Azalia Bilis, MD     CRITICAL CARE Performed by: Azalia Bilis Total critical care time: 40 minutes Critical care time was exclusive of separately billable procedures and treating other patients. Critical care was necessary to treat or prevent imminent or life-threatening deterioration. Critical care was time spent personally by me on the following activities: development of treatment plan with patient and/or surrogate as well as nursing, discussions with consultants, evaluation of patient's response to treatment, examination of patient, obtaining history from patient or surrogate, ordering and performing treatments and interventions, ordering and review of laboratory studies, ordering and review of radiographic studies, pulse oximetry and re-evaluation of patient's  condition.   Medications Ordered in ED Medications  ondansetron (ZOFRAN) injection 4 mg (4 mg Intravenous Not Given 10/09/17 1416)  morphine 4 MG/ML injection 4  mg (4 mg Intravenous Not Given 10/09/17 1416)  amLODipine (NORVASC) tablet 10 mg (10 mg Oral Given 10/10/17 1125)  aspirin EC tablet 81 mg (81 mg Oral Given 10/10/17 1125)  latanoprost (XALATAN) 0.005 % ophthalmic solution 1 drop (1 drop Both Eyes Given 10/09/17 2142)  metoCLOPramide (REGLAN) tablet 10 mg (10 mg Oral Given 10/10/17 1125)  pravastatin (PRAVACHOL) tablet 20 mg (20 mg Oral Given 10/09/17 2143)  enoxaparin (LOVENOX) injection 40 mg (40 mg Subcutaneous Given 10/09/17 2144)  acetaminophen (TYLENOL) tablet 650 mg (650 mg Oral Given 10/09/17 1935)    Or  acetaminophen (TYLENOL) suppository 650 mg ( Rectal See Alternative 10/09/17 1935)  ondansetron (ZOFRAN) tablet 4 mg (has no administration in time range)    Or  ondansetron (ZOFRAN) injection 4 mg (has no administration in time range)  sodium chloride flush (NS) 0.9 % injection 3 mL (0 mLs Intravenous Duplicate 10/10/17 1126)  morphine 4 MG/ML injection 2 mg (has no administration in time range)  piperacillin-tazobactam (ZOSYN) IVPB 3.375 g ( Intravenous Rate/Dose Verify 10/10/17 1500)  Brinzolamide-Brimonidine 1-0.2 % SUSP 1 drop (1 drop Ophthalmic Given 10/09/17 2141)  insulin aspart (novoLOG) injection 0-15 Units (0 Units Subcutaneous Not Given 10/10/17 1638)  insulin glargine (LANTUS) injection 20 Units (has no administration in time range)  dextrose 5 % and 0.45 % NaCl with KCl 20 mEq/L infusion ( Intravenous Transfusing/Transfer 10/10/17 1500)  potassium chloride 10 mEq in 100 mL IVPB (10 mEq Intravenous New Bag/Given 10/10/17 1250)  morphine 4 MG/ML injection 3 mg (3 mg Intravenous Given 10/10/17 1020)  morphine 4 MG/ML injection (  Duplicate 10/10/17 1126)  technetium TC 23M mebrofenin (CHOLETEC) injection 5.3 millicurie (5.3 millicuries Intravenous Contrast Given 10/10/17 0820)     Initial  Impression / Assessment and Plan / ED Course  I have reviewed the triage vital signs and the nursing notes.  Pertinent labs & imaging results that were available during my care of the patient were reviewed by me and considered in my medical decision making (see chart for details).     Clinical findings concerning for acute cholecystitis.  Patient be started on IV Zosyn at this time.  Ultrasound demonstrates no acute radiographic signs of cholecystitis however given his age I am concerned about the possibility of chronic cholecystitis.  He does have sludge noted in his gallbladder.  His white count has elevated somewhat since prior ER visit.  I personally reviewed the patient's EKG from 2 nights ago and it does appear to be high-grade AV block.  He is here with what appears to be lengthening PR progression on his EKG and represents Mobitz type I block.  It does appear as though in the ER he has had some intermittent episodes of Mobitz type II AV block.  He will likely need permanent pacemaker placement.  Patient will undergo cardiology consultation.  He is on pacer pads at this time.  My concern however is that if he has evidence of acute cholecystitis he will not be a candidate for pacemaker placement.  Also if he ends up having acute cholecystitis he likely will not be an operative candidate until his bradycardia and AV block has resolved.  He may end up requiring interventional radiology involvement for percutaneous cholecystostomy tube and prolonged IV antibiotics.  Cardiology consultation  Admission to [facility] hospitalist.  Patient will likely need HIDA scan for further evaluation given the negative ultrasound x2  Final Clinical Impressions(s) / ED Diagnoses   Final diagnoses:  Upper abdominal pain  Acute bilateral upper abdominal pain  Mobitz type 1 second degree AV block    ED Discharge Orders    None       Azalia Bilis, MD 10/10/17 1655

## 2017-10-10 NOTE — Consult Note (Addendum)
Referring Provider:  [facility] Primary Care Physician:  [facility]  Primary Gastroenterologist:  unassigned  Reason for Consultation:  Abdominal pain. Possible pancreatitis  HPI: Matthew Sellers is a 77 y.o. male with past medical history of coronary artery disease, history of AV block, history of diabetes presented to the hospital yesterday with chest pain and abdominal pain, nausea and vomiting.he was also complaining of worsening shortness of breath and dyspnea on exertion.he was found to have high-grade heart block. Followed by electrophysiology was planning for pacemaker placement prior to discharge.he underwent further workup with abdominal pain with ultrasound which showed gallbladder sludge.normal CBD. He had a fever 101 yesterday . HIDA scan today showed  prompt excretion into the biliary system and within small bowel.nonvisualization of the gallbladder, concerning for cystic duct obstruction/acute cholecystitis.CT scan on admission 2 days ago showed normal-appearing pancreas. No pancreatic ductal dilatation. Calcified gallstone without any biliary dilatation. Normal lipase.   Patient seen and examined at bedside. He started noticing centrally/epigastric abdominal pain which was initially radiating to his right upper quadrant around 4 days ago. His pain equally localized right upper quadrant. Associated with nausea and nonbloody vomiting. Denied diarrhea or constipation. He had a fever yesterday. His doing better today. Abdominal pain has improved. Denied further nausea and vomiting.  Denies any previous EGD or colonoscopy. No family history of colon cancer   Past Medical History:  Diagnosis Date  . Anemia   . Bradycardia   . CAD (coronary artery disease)   . Chest tightness   . Diabetes mellitus without complication (HCC)   . Dyslipidemia   . Dyspnea on exertion   . Hypertension   . Obesity   . Pneumonia     Past Surgical History:  Procedure Laterality Date  . ACHILLES TENDON SURGERY      for rupture  . CARDIAC CATHETERIZATION  07/08/2009   mild to mod . prox LAD 50% (Dr. Garen LahEichhorn)  . DOPPLER ECHOCARDIOGRAPHY  04/10/2008   LVEF 70-80% ,norm  . stress myoview  04/09/2008   EF 54%; LV  norm    Prior to Admission medications   Medication Sig Start Date End Date Taking? Authorizing Provider  amLODipine (NORVASC) 10 MG tablet Take 10 mg by mouth daily.   Yes [provider]  aspirin EC 81 MG tablet Take 81 mg by mouth daily.   Yes [provider]  bismuth subsalicylate (PEPTO BISMOL) 262 MG/15ML suspension Take 30 mLs by mouth as needed for indigestion.   Yes [provider]  Brinzolamide-Brimonidine 1-0.2 % SUSP Place 1 drop into both eyes 2 (two) times daily.   Yes [provider]  fluticasone (FLONASE) 50 MCG/ACT nasal spray Place 2 sprays into both nostrils daily as needed for allergies or rhinitis.   Yes [provider]  insulin glargine (LANTUS) 100 UNIT/ML injection Inject 30 Units into the skin at bedtime.    Yes [provider]  Boris LownKrill Oil 300 MG CAPS Take 300 mg by mouth daily.   Yes [provider]  latanoprost (XALATAN) 0.005 % ophthalmic solution Place 1 drop into both eyes at bedtime.   Yes [provider]  lisinopril-hydrochlorothiazide (PRINZIDE,ZESTORETIC) 20-25 MG per tablet Take 2 tablets by mouth daily.   Yes [provider]  metFORMIN (GLUCOPHAGE) 1000 MG tablet Take 1,000 mg by mouth 2 (two) times daily with a meal.   Yes [provider]  metoCLOPramide (REGLAN) 10 MG tablet Take 1 tablet (10 mg total) by mouth every 6 (six) hours. 10/08/17  Yes Elpidio Anis, PA-C  nitroGLYCERIN (NITROSTAT) 0.4 MG SL tablet Place 0.4 mg under the tongue every 5 (five) minutes as needed for chest pain.   Yes [provider]  ondansetron (ZOFRAN ODT) 4 MG disintegrating tablet Take 1 tablet (4 mg total) by mouth every 8 (eight) hours as needed for nausea or vomiting. 04/17/16  Yes  Melene Plan, DO  pravastatin (PRAVACHOL) 20 MG tablet Take 20 mg by mouth at bedtime.   Yes [provider]    Scheduled Meds: . amLODipine  10 mg Oral Daily  . aspirin EC  81 mg Oral Daily  . Brinzolamide-Brimonidine  1 drop Ophthalmic BID  . enoxaparin (LOVENOX) injection  40 mg Subcutaneous Q24H  . insulin aspart  0-15 Units Subcutaneous Q4H  . insulin glargine  20 Units Subcutaneous QHS  . latanoprost  1 drop Both Eyes QHS  . metoCLOPramide  10 mg Oral Q6H  .  morphine injection  4 mg Intravenous Once  . ondansetron (ZOFRAN) IV  4 mg Intravenous Once  . pravastatin  20 mg Oral QHS  . sodium chloride flush  3 mL Intravenous Q12H   Continuous Infusions: . dextrose 5 % and 0.45 % NaCl with KCl 20 mEq/L    . piperacillin-tazobactam (ZOSYN)  IV 3.375 g (10/10/17 0520)   PRN Meds:.acetaminophen **OR** acetaminophen, morphine injection, ondansetron **OR** ondansetron (ZOFRAN) IV  Allergies as of 10/09/2017  . (No Known Allergies)    Family History  Problem Relation Age of Onset  . Leukemia Mother     Social History   Socioeconomic History  . Marital status: Married    Spouse name: Not on file  . Number of children: Not on file  . Years of education: Not on file  . Highest education level: Not on file  Occupational History  . Occupation: retired  Engineer, production  . Financial resource strain: Not on file  . Food insecurity:    Worry: Not on file    Inability: Not on file  . Transportation needs:    Medical: Not on file    Non-medical: Not on file  Tobacco Use  . Smoking status: Never Smoker  . Smokeless tobacco: Never Used  Substance and Sexual Activity  . Alcohol use: No    Alcohol/week: 0.0 oz  . Drug use: No  . Sexual activity: Not on file  Lifestyle  . Physical activity:    Days per week: Not on file    Minutes per session: Not on file  . Stress: Not on file  Relationships  . Social connections:    Talks on phone: Not on file    Gets together:  Not on file    Attends religious service: Not on file    Active member of club or organization: Not on file    Attends meetings of clubs or organizations: Not on file    Relationship status: Not on file  . Intimate partner violence:    Fear of current or ex partner: Not on file    Emotionally abused: Not on file    Physically abused: Not on file    Forced sexual activity: Not on file  Other Topics Concern  . Not on file  Social History Narrative  . Not on file    Review of Systems: Review of Systems  Constitutional: Positive for fever. Negative for chills.  HENT: Negative for hearing loss and tinnitus.   Eyes: Negative for blurred vision and double vision.  Respiratory: Positive for  shortness of breath. Negative for cough and hemoptysis.   Cardiovascular: Positive for chest pain. Negative for palpitations and orthopnea.  Gastrointestinal: Positive for abdominal pain, heartburn, nausea and vomiting. Negative for blood in stool, diarrhea and melena.  Genitourinary: Negative for dysuria and urgency.  Musculoskeletal: Positive for joint pain and myalgias.  Skin: Negative for itching and rash.  Neurological: Negative for seizures and loss of consciousness.  Endo/Heme/Allergies: Does not bruise/bleed easily.  Psychiatric/Behavioral: Negative for hallucinations and suicidal ideas.    Physical Exam: Vital signs: Vitals:   10/10/17 0700 10/10/17 1131  BP: (!) 160/59 (!) 167/47  Pulse: (!) 35 (!) 50  Resp: 15 12  Temp: 100 F (37.8 C) 99.8 F (37.7 C)  SpO2: 95% (!) 89%   Last BM Date: 10/08/17 Physical Exam  Constitutional: He is oriented to person, place, and time. He appears well-developed and well-nourished. No distress.  HENT:  Head: Normocephalic and atraumatic.  Mouth/Throat: Oropharynx is clear and moist. No oropharyngeal exudate.  Eyes: EOM are normal. No scleral icterus.  Neck: Normal range of motion. Neck supple.  Cardiovascular: Regular rhythm and normal heart  sounds.  bradycardia  Pulmonary/Chest: Effort normal and breath sounds normal. No respiratory distress.  Abdominal: Soft. Bowel sounds are normal. He exhibits distension. There is no tenderness. There is no rebound.  Musculoskeletal: Normal range of motion. He exhibits no edema.  Neurological: He is alert and oriented to person, place, and time.  Skin: Skin is warm. No erythema.  Psychiatric: Judgment and thought content normal.  Vitals reviewed.   GI:  Lab Results: Recent Labs    10/07/17 2115 10/09/17 0938 10/10/17 0413  WBC 7.7 11.3* 9.6  HGB 12.0* 11.3* 11.0*  HCT 38.3* 35.4* 34.9*  PLT 289 240 249   BMET Recent Labs    10/07/17 2115 10/09/17 0938 10/10/17 0413  NA 138 138 138  K 3.4* 3.3* 2.8*  CL 102 100 97*  CO2 25 28 28   GLUCOSE 233* 223* 98  BUN 22 25* 24*  CREATININE 1.30* 1.61* 1.61*  CALCIUM 9.9 9.1 9.0   LFT Recent Labs    10/08/17 0134 10/09/17 0938  PROT 7.0 6.6  ALBUMIN 4.0 3.6  AST 17 20  ALT 14 12  ALKPHOS 37* 38  BILITOT 1.1 1.9*  BILIDIR 0.2  --   IBILI 0.9  --    PT/INR No results for input(s): LABPROT, INR in the last 72 hours.   Studies/Results: Nm Hepato W/eject Fract  Result Date: 10/10/2017 CLINICAL DATA:  Chronic RIGHT UPPER quadrant abdominal pain which acutely worsened 3 days ago, associated with nausea and vomiting. EXAM: NUCLEAR MEDICINE HEPATOBILIARY IMAGING TECHNIQUE: Sequential images of the abdomen were obtained out to 90 minutes following intravenous administration of radiopharmaceutical. At 90 minutes, 3.0 mg of morphine were administered intravenously and imaging was performed for an additional 30 minutes. RADIOPHARMACEUTICALS:  5.3 mCi Tc-53m Choletec IV COMPARISON:  No prior nuclear medicine hepatobiliary scan. RIGHT UPPER quadrant abdominal ultrasound yesterday which demonstrated gallbladder sludge but no sonographic evidence of acute cholecystitis. FINDINGS: Hepatic uptake of radiotracer is normal and there is prompt  excretion into the biliary system. Small bowel activity is visible at approximately 10 minutes. The gallbladder was not visible out to 90 minutes. There is no evidence of tracer retention by the liver. The gallbladder was not visible after intravenous administration of morphine. IMPRESSION: Nonvisualization of the gallbladder after intravenous morphine administration indicating cystic duct obstruction and therefore acute cholecystitis. These results will be called to  the ordering clinician or representative by the Radiologist Assistant, and communication documented in the PACS or zVision Dashboard. Electronically Signed   By: Hulan Saas M.D.   On: 10/10/2017 12:19   US Abdomen Limited Ruq  Result Date: 10/09/2017 CLINICAL DATA:  Right upper quadrant pain EXAM: ULTRASOUND ABDOMEN LIMITED RIGHT UPPER QUADRANT COMPARISON:  10/08/2017 FINDINGS: Gallbladder: Gallbladder is physiologically distended and contains small calculi biliary sludge along the dependent aspect. No wall thickening or pericholecystic fluid is identified. No sonographic Murphy sign noted by sonographer. Common bile duct: Diameter: 5.3 mm Liver: No focal lesion identified. Within normal limits in parenchymal echogenicity. Portal vein is patent on color Doppler imaging with normal direction of blood flow towards the liver. IMPRESSION: Cholelithiasis with biliary sludge is redemonstrated without secondary signs of acute cholecystitis. Electronically Signed   By: Tollie Eth M.D.   On: 10/09/2017 14:57    Impression/Plan: - right upper quadrant abdominal pain - essentially normal LFTs. Normal lipase. No leukocytosis. HIDA scan showing nonvisualization of the gallbladder concerning for cystic duct obstruction/acute cholecystitis.  - High grade heart block. - plan for pacemaker placement by cardiology prior to discharge. - Fever - workup per primary team.  Recommendations ---------------------------- - No further GI workup needed at this  point. essentially normal LFTs. Mildly elevated bilirubin could be indirect hyperbilirubinemia. Normal lipase. No leukocytosis.Ultrasound and CT scan showed no biliary ductal dilatation. No evidence of pancreatitis.CT scan images reviewed personally by me. Difficult to delineate CBD but  no obvious lesions noted. - surgery has been consulted.D/W Dr. Isidoro Donning  - GI will follow from distance. Call us back if needed    LOS: 1 day   Kathi Der  MD, FACP 10/10/2017, 12:42 PM  Contact #  [phone]

## 2017-10-10 NOTE — Progress Notes (Addendum)
Electrophysiology Rounding Note  Patient Name: Matthew Sellers Date of Encounter: 10/10/2017  Primary Cardiologist: [facility] Electrophysiologist: Ladona Ridgel   Subjective   The patient is doing well today.  At this time, the patient denies chest pain, shortness of breath, or any new concerns.  Inpatient Medications    Scheduled Meds: . amLODipine  10 mg Oral Daily  . aspirin EC  81 mg Oral Daily  . Brinzolamide-Brimonidine  1 drop Ophthalmic BID  . enoxaparin (LOVENOX) injection  40 mg Subcutaneous Q24H  . insulin aspart  0-15 Units Subcutaneous TID WC  . insulin aspart  0-5 Units Subcutaneous QHS  . insulin glargine  30 Units Subcutaneous QHS  . latanoprost  1 drop Both Eyes QHS  . metoCLOPramide  10 mg Oral Q6H  .  morphine injection  4 mg Intravenous Once  . ondansetron (ZOFRAN) IV  4 mg Intravenous Once  . pravastatin  20 mg Oral QHS  . sodium chloride flush  3 mL Intravenous Q12H   Continuous Infusions: . sodium chloride    . piperacillin-tazobactam (ZOSYN)  IV 3.375 g (10/10/17 0520)  . potassium chloride     PRN Meds: acetaminophen **OR** acetaminophen, morphine injection, ondansetron **OR** ondansetron (ZOFRAN) IV   Vital Signs    Vitals:   10/09/17 2300 10/10/17 0000 10/10/17 0200 10/10/17 0700  BP:    (!) 160/59  Pulse: (!) 39 (!) 40 (!) 43 (!) 35  Resp: 18 (!) 29 (!) 22 15  Temp:    100 F (37.8 C)  TempSrc:    Oral  SpO2: 97% 94% 98% 95%  Weight:      Height:        Intake/Output Summary (Last 24 hours) at 10/10/2017 0754 Last data filed at 10/09/2017 2300 Gross per 24 hour  Intake 301.56 ml  Output 250 ml  Net 51.56 ml   Filed Weights   10/09/17 0940 10/09/17 1729  Weight: 240 lb (108.9 kg) 240 lb 15.4 oz (109.3 kg)    Physical Exam    GEN- The patient is elderly appearing, alert and oriented x 3 today.   Head- normocephalic, atraumatic Eyes-  Sclera clear, conjunctiva pink Ears- hearing intact Oropharynx- clear Neck-  supple Lungs- Clear to ausculation bilaterally, normal work of breathing Heart- Bradycardic regular rate and rhythm  GI- soft, NT, ND, + BS Extremities- no clubbing, cyanosis, or edema Skin- no rash or lesion Psych- euthymic mood, full affect Neuro- strength and sensation are intact  Labs    CBC Recent Labs    10/09/17 0938 10/10/17 0413  WBC 11.3* 9.6  HGB 11.3* 11.0*  HCT 35.4* 34.9*  MCV 89.4 89.7  PLT 240 249   Basic Metabolic Panel Recent Labs    16/10/96 0938 10/10/17 0413  NA 138 138  K 3.3* 2.8*  CL 100 97*  CO2 28 28  GLUCOSE 223* 98  BUN 25* 24*  CREATININE 1.61* 1.61*  CALCIUM 9.1 9.0  MG 1.7  --   PHOS 3.2  --    Liver Function Tests Recent Labs    10/08/17 0134 10/09/17 0938  AST 17 20  ALT 14 12  ALKPHOS 37* 38  BILITOT 1.1 1.9*  PROT 7.0 6.6  ALBUMIN 4.0 3.6   Recent Labs    10/09/17 0938  LIPASE 36   Cardiac Enzymes Recent Labs    10/09/17 1732 10/09/17 2149 10/10/17 0413  TROPONINI 0.03* 0.04* 0.04*   Hemoglobin A1C Recent Labs    10/09/17 1836  HGBA1C 7.5*   Thyroid Function Tests Recent Labs    10/09/17 1105  TSH 2.703    Telemetry    High grade heart block  (personally reviewed)  Radiology    [place] Abdomen Limited Ruq  Result Date: 10/09/2017 CLINICAL DATA:  Right upper quadrant pain EXAM: ULTRASOUND ABDOMEN LIMITED RIGHT UPPER QUADRANT COMPARISON:  10/08/2017 FINDINGS: Gallbladder: Gallbladder is physiologically distended and contains small calculi biliary sludge along the dependent aspect. No wall thickening or pericholecystic fluid is identified. No sonographic Murphy sign noted by sonographer. Common bile duct: Diameter: 5.3 mm Liver: No focal lesion identified. Within normal limits in parenchymal echogenicity. Portal vein is patent on color Doppler imaging with normal direction of blood flow towards the liver. IMPRESSION: Cholelithiasis with biliary sludge is redemonstrated without secondary signs of acute  cholecystitis. Electronically Signed   By: Tollie Ethavid  Kwon M.D.   On: 10/09/2017 14:57    Assessment & Plan    1.  High grade heart block Will tentatively plan for PPM tomorrow - will need to be sure he does not have an infectious process prior to proceeding, +fever last night He has had longstanding heart block and pacing is not urgent  2.  Abdominal pain Per primary team  3.  Hypokalemia Repleted this am   Signed, Gypsy BalsamAmber Seiler, NP  10/10/2017, 7:54 AM   EP Attending  Agree with above. See my note as well.   Leonia ReevesGregg Russel Morain,M.D.

## 2017-10-10 NOTE — Progress Notes (Signed)
Progress Note  Patient Name: Matthew Sellers Date of Encounter: 10/10/2017  Primary Cardiologist: new  Subjective   Sleeping. Abdominal pain better  Inpatient Medications    Scheduled Meds: . amLODipine  10 mg Oral Daily  . aspirin EC  81 mg Oral Daily  . Brinzolamide-Brimonidine  1 drop Ophthalmic BID  . enoxaparin (LOVENOX) injection  40 mg Subcutaneous Q24H  . insulin aspart  0-15 Units Subcutaneous TID WC  . insulin aspart  0-5 Units Subcutaneous QHS  . insulin glargine  30 Units Subcutaneous QHS  . latanoprost  1 drop Both Eyes QHS  . metoCLOPramide  10 mg Oral Q6H  .  morphine injection  4 mg Intravenous Once  . ondansetron (ZOFRAN) IV  4 mg Intravenous Once  . pravastatin  20 mg Oral QHS  . sodium chloride flush  3 mL Intravenous Q12H   Continuous Infusions: . sodium chloride    . piperacillin-tazobactam (ZOSYN)  IV 3.375 g (10/10/17 0520)  . potassium chloride     PRN Meds: acetaminophen **OR** acetaminophen, morphine injection, ondansetron **OR** ondansetron (ZOFRAN) IV   Vital Signs    Vitals:   10/09/17 2300 10/10/17 0000 10/10/17 0200 10/10/17 0700  BP:    (!) 160/59  Pulse: (!) 39 (!) 40 (!) 43 (!) 35  Resp: 18 (!) 29 (!) 22 15  Temp:    100 F (37.8 C)  TempSrc:    Oral  SpO2: 97% 94% 98% 95%  Weight:      Height:        Intake/Output Summary (Last 24 hours) at 10/10/2017 0750 Last data filed at 10/09/2017 2300 Gross per 24 hour  Intake 301.56 ml  Output 250 ml  Net 51.56 ml   Filed Weights   10/09/17 0940 10/09/17 1729  Weight: 240 lb (108.9 kg) 240 lb 15.4 oz (109.3 kg)    Telemetry    nsr with CHB/2:1 AV block - Personally Reviewed  ECG    No new - Personally Reviewed  Physical Exam   GEN: No acute distress.   Neck: 6 cm JVD Cardiac: Reg brady, no murmurs, rubs, or gallops.  Respiratory: Clear to auscultation bilaterally. GI: Soft, nontender, non-distended  MS: No edema; No deformity. Neuro:  Nonfocal  Psych: Normal  affect   Labs    Chemistry Recent Labs  Lab 10/07/17 2115 10/08/17 0134 10/09/17 0938 10/10/17 0413  NA 138  --  138 138  K 3.4*  --  3.3* 2.8*  CL 102  --  100 97*  CO2 25  --  28 28  GLUCOSE 233*  --  223* 98  BUN 22  --  25* 24*  CREATININE 1.30*  --  1.61* 1.61*  CALCIUM 9.9  --  9.1 9.0  PROT  --  7.0 6.6  --   ALBUMIN  --  4.0 3.6  --   AST  --  17 20  --   ALT  --  14 12  --   ALKPHOS  --  37* 38  --   BILITOT  --  1.1 1.9*  --   GFRNONAA 52*  --  40* 40*  GFRAA >60  --  47* 47*  ANIONGAP 11  --  10 13     Hematology Recent Labs  Lab 10/07/17 2115 10/09/17 0938 10/10/17 0413  WBC 7.7 11.3* 9.6  RBC 4.24 3.96* 3.89*  HGB 12.0* 11.3* 11.0*  HCT 38.3* 35.4* 34.9*  MCV 90.3 89.4 89.7  MCH 28.3 28.5 28.3  MCHC 31.3 31.9 31.5  RDW 13.4 13.6 13.8  PLT 289 240 249    Cardiac Enzymes Recent Labs  Lab 10/09/17 0938 10/09/17 1732 10/09/17 2149 10/10/17 0413  TROPONINI 0.04* 0.03* 0.04* 0.04*    Recent Labs  Lab 10/07/17 2130 10/08/17 0146  TROPIPOC 0.00 0.01     BNPNo results for input(s): BNP, PROBNP in the last 168 hours.   DDimer No results for input(s): DDIMER in the last 168 hours.   Radiology    [place] Abdomen Limited Ruq  Result Date: 10/09/2017 CLINICAL DATA:  Right upper quadrant pain EXAM: ULTRASOUND ABDOMEN LIMITED RIGHT UPPER QUADRANT COMPARISON:  10/08/2017 FINDINGS: Gallbladder: Gallbladder is physiologically distended and contains small calculi biliary sludge along the dependent aspect. No wall thickening or pericholecystic fluid is identified. No sonographic Murphy sign noted by sonographer. Common bile duct: Diameter: 5.3 mm Liver: No focal lesion identified. Within normal limits in parenchymal echogenicity. Portal vein is patent on color Doppler imaging with normal direction of blood flow towards the liver. IMPRESSION: Cholelithiasis with biliary sludge is redemonstrated without secondary signs of acute cholecystitis. Electronically  Signed   By: Tollie Ethavid  Kwon M.D.   On: 10/09/2017 14:57    Cardiac Studies   none  Patient Profile     77 y.o. male admitted with abdominal pain and fever and found to have high grade AV block and bradycardia.   Assessment & Plan    1. High grade heart block - he is stable but will need PPM if possible before DC. Tentatively plan for tomorrow 2. Hypokalemia - replete 3. Fever/abdominal pain - blood cultures pending. I would like either surgical consult or ID consult (preferred) to comment on source of abdominal pain and fever, plan of treatment and optimal timing of PPM. When is it safe to proceed with PPM.    For questions or updates, please contact [facility] Please consult [email or web address] for contact info under Cardiology/STEMI.      Signed, Lewayne BuntingGregg Flower Franko, MD  10/10/2017, 7:50 AM  Patient ID: Josph MachoHoward F Romer, male   DOB: 04/25/1940, 77 y.o.   MRN: [id number]

## 2017-10-11 ENCOUNTER — Inpatient Hospital Stay (HOSPITAL_COMMUNITY): Payer: Medicare Other | Admitting: Certified Registered Nurse Anesthetist

## 2017-10-11 ENCOUNTER — Inpatient Hospital Stay (HOSPITAL_COMMUNITY): Payer: Medicare Other

## 2017-10-11 ENCOUNTER — Encounter (HOSPITAL_COMMUNITY): Payer: Self-pay | Admitting: Orthopedic Surgery

## 2017-10-11 ENCOUNTER — Encounter (HOSPITAL_COMMUNITY)
Admission: EM | Disposition: A | Discharge status: Skilled Nursing Facility | Payer: Self-pay | Source: Home / Self Care | Attending: Internal Medicine

## 2017-10-11 DIAGNOSIS — N183 Chronic kidney disease, stage 3 (moderate): Secondary | ICD-10-CM

## 2017-10-11 DIAGNOSIS — R001 Bradycardia, unspecified: Secondary | ICD-10-CM

## 2017-10-11 DIAGNOSIS — R1012 Left upper quadrant pain: Secondary | ICD-10-CM

## 2017-10-11 DIAGNOSIS — E785 Hyperlipidemia, unspecified: Secondary | ICD-10-CM

## 2017-10-11 DIAGNOSIS — I361 Nonrheumatic tricuspid (valve) insufficiency: Secondary | ICD-10-CM

## 2017-10-11 DIAGNOSIS — R1011 Right upper quadrant pain: Secondary | ICD-10-CM

## 2017-10-11 DIAGNOSIS — I442 Atrioventricular block, complete: Secondary | ICD-10-CM

## 2017-10-11 HISTORY — PX: CHOLECYSTECTOMY: SHX55

## 2017-10-11 HISTORY — PX: TEMPORARY PACEMAKER: CATH118268

## 2017-10-11 LAB — COMPREHENSIVE METABOLIC PANEL
ALBUMIN: 3 g/dL — AB (ref 3.5–5.0)
ALK PHOS: 47 U/L (ref 38–126)
ALT: 14 U/L (ref 0–44)
ANION GAP: 12 (ref 5–15)
AST: 19 U/L (ref 15–41)
BILIRUBIN TOTAL: 2.9 mg/dL — AB (ref 0.3–1.2)
BUN: 15 mg/dL (ref 8–23)
CO2: 27 mmol/L (ref 22–32)
Calcium: 8.8 mg/dL — ABNORMAL LOW (ref 8.9–10.3)
Chloride: 101 mmol/L (ref 98–111)
Creatinine, Ser: 1.58 mg/dL — ABNORMAL HIGH (ref 0.61–1.24)
GFR calc Af Amer: 48 mL/min — ABNORMAL LOW (ref >60)
GFR calc non Af Amer: 41 mL/min — ABNORMAL LOW (ref >60)
GLUCOSE: 118 mg/dL — AB (ref 70–99)
POTASSIUM: 3 mmol/L — AB (ref 3.5–5.1)
SODIUM: 140 mmol/L (ref 135–145)
TOTAL PROTEIN: 6.4 g/dL — AB (ref 6.5–8.1)

## 2017-10-11 LAB — ECHOCARDIOGRAM COMPLETE
HEIGHTINCHES: 70 in
WEIGHTICAEL: 3855.4 [oz_av]

## 2017-10-11 LAB — GLUCOSE, CAPILLARY
GLUCOSE-CAPILLARY: 80 mg/dL (ref 70–99)
GLUCOSE-CAPILLARY: 169 mg/dL — AB (ref 70–99)
GLUCOSE-CAPILLARY: 186 mg/dL — AB (ref 70–99)
GLUCOSE-CAPILLARY: 188 mg/dL — AB (ref 70–99)
GLUCOSE-CAPILLARY: 231 mg/dL — AB (ref 70–99)
Glucose-Capillary: 127 mg/dL — ABNORMAL HIGH (ref 70–99)
Glucose-Capillary: 117 mg/dL — ABNORMAL HIGH (ref 70–99)
Glucose-Capillary: 170 mg/dL — ABNORMAL HIGH (ref 70–99)

## 2017-10-11 LAB — CBC
HEMATOCRIT: 33.2 % — AB (ref 39.0–52.0)
HEMOGLOBIN: 10.6 g/dL — AB (ref 13.0–17.0)
MCH: 28.1 pg (ref 26.0–34.0)
MCHC: 31.9 g/dL (ref 30.0–36.0)
MCV: 88.1 fL (ref 78.0–100.0)
Platelets: 254 10*3/uL (ref 150–400)
RBC: 3.77 MIL/uL — ABNORMAL LOW (ref 4.22–5.81)
RDW: 13.7 % (ref 11.5–15.5)
WBC: 8.1 10*3/uL (ref 4.0–10.5)

## 2017-10-11 LAB — SURGICAL PCR SCREEN
MRSA, PCR: NEGATIVE
STAPHYLOCOCCUS AUREUS: NEGATIVE

## 2017-10-11 LAB — HEMOGLOBIN A1C
HEMOGLOBIN A1C: 7.4 % — AB (ref 4.8–5.6)
Mean Plasma Glucose: 166 mg/dL

## 2017-10-11 SURGERY — TEMPORARY PACEMAKER
Anesthesia: LOCAL

## 2017-10-11 SURGERY — LAPAROSCOPIC CHOLECYSTECTOMY WITH INTRAOPERATIVE CHOLANGIOGRAM
Anesthesia: General | Site: Abdomen

## 2017-10-11 MED ORDER — POTASSIUM CHLORIDE CRYS ER 20 MEQ PO TBCR
40.0000 meq | EXTENDED_RELEASE_TABLET | Freq: Once | ORAL | Status: AC
  Administered 2017-10-11: 40 meq via ORAL
  Filled 2017-10-11: qty 2

## 2017-10-11 MED ORDER — BUPIVACAINE-EPINEPHRINE 0.25% -1:200000 IJ SOLN
INTRAMUSCULAR | Status: DC | PRN
  Administered 2017-10-11: 30 mL

## 2017-10-11 MED ORDER — SODIUM CHLORIDE 0.9 % IV SOLN
80.0000 mg | INTRAVENOUS | Status: DC
  Filled 2017-10-11: qty 2

## 2017-10-11 MED ORDER — ONDANSETRON HCL 4 MG/2ML IJ SOLN: 4.0000 mg | Freq: Four times a day (QID) | INTRAMUSCULAR | Status: DC | PRN

## 2017-10-11 MED ORDER — SODIUM CHLORIDE 0.9% FLUSH
3.0000 mL | Freq: Two times a day (BID) | INTRAVENOUS | Status: DC
  Administered 2017-10-11 – 2017-10-13 (×5): 3 mL via INTRAVENOUS

## 2017-10-11 MED ORDER — HYDROCODONE-ACETAMINOPHEN 5-325 MG PO TABS
1.0000 | ORAL_TABLET | ORAL | Status: DC | PRN
  Administered 2017-10-13 (×2): 1 via ORAL
  Administered 2017-10-14: 2 via ORAL
  Filled 2017-10-11: qty 1
  Filled 2017-10-11: qty 2
  Filled 2017-10-11 (×2): qty 1

## 2017-10-11 MED ORDER — HYDROMORPHONE HCL 1 MG/ML IJ SOLN: 0.2500 mg | INTRAMUSCULAR | Status: DC | PRN

## 2017-10-11 MED ORDER — BUPIVACAINE-EPINEPHRINE (PF) 0.25% -1:200000 IJ SOLN
INTRAMUSCULAR | Status: AC
  Filled 2017-10-11: qty 30

## 2017-10-11 MED ORDER — MEPERIDINE HCL 50 MG/ML IJ SOLN: 6.2500 mg | INTRAMUSCULAR | Status: DC | PRN

## 2017-10-11 MED ORDER — CHLORHEXIDINE GLUCONATE 4 % EX LIQD
CUTANEOUS | Status: AC
  Administered 2017-10-11: 4 via TOPICAL
  Filled 2017-10-11: qty 60

## 2017-10-11 MED ORDER — CEFAZOLIN SODIUM-DEXTROSE 2-4 GM/100ML-% IV SOLN
2.0000 g | INTRAVENOUS | Status: DC
  Filled 2017-10-11: qty 100

## 2017-10-11 MED ORDER — SUGAMMADEX SODIUM 200 MG/2ML IV SOLN
INTRAVENOUS | Status: DC | PRN
  Administered 2017-10-11: 250 mg via INTRAVENOUS

## 2017-10-11 MED ORDER — LACTATED RINGERS IV SOLN
INTRAVENOUS | Status: DC
  Administered 2017-10-11: 14:00:00 via INTRAVENOUS

## 2017-10-11 MED ORDER — ACETAMINOPHEN 325 MG PO TABS: 650.0000 mg | ORAL_TABLET | ORAL | Status: DC | PRN

## 2017-10-11 MED ORDER — SODIUM CHLORIDE 0.9 % IR SOLN
Status: DC | PRN
  Administered 2017-10-11: 1000 mL

## 2017-10-11 MED ORDER — SODIUM CHLORIDE 0.9 % IV SOLN
INTRAVENOUS | Status: AC
  Filled 2017-10-11: qty 2

## 2017-10-11 MED ORDER — LIDOCAINE HCL (PF) 1 % IJ SOLN
INTRAMUSCULAR | Status: AC
  Filled 2017-10-11: qty 30

## 2017-10-11 MED ORDER — CHLORHEXIDINE GLUCONATE 4 % EX LIQD
60.0000 mL | Freq: Once | CUTANEOUS | Status: AC
  Administered 2017-10-11: 4 via TOPICAL

## 2017-10-11 MED ORDER — 0.9 % SODIUM CHLORIDE (POUR BTL) OPTIME
TOPICAL | Status: DC | PRN
  Administered 2017-10-11: 1000 mL

## 2017-10-11 MED ORDER — ROCURONIUM BROMIDE 100 MG/10ML IV SOLN
INTRAVENOUS | Status: DC | PRN
  Administered 2017-10-11: 10 mg via INTRAVENOUS
  Administered 2017-10-11: 60 mg via INTRAVENOUS
  Administered 2017-10-11: 20 mg via INTRAVENOUS

## 2017-10-11 MED ORDER — HEPARIN (PORCINE) IN NACL 1000-0.9 UT/500ML-% IV SOLN
INTRAVENOUS | Status: DC | PRN
  Administered 2017-10-11: 500 mL

## 2017-10-11 MED ORDER — CEFAZOLIN SODIUM-DEXTROSE 2-4 GM/100ML-% IV SOLN
INTRAVENOUS | Status: AC
  Filled 2017-10-11: qty 100

## 2017-10-11 MED ORDER — SODIUM CHLORIDE 0.9 % IV SOLN
INTRAVENOUS | Status: DC | PRN
  Administered 2017-10-11: 12 mL

## 2017-10-11 MED ORDER — SODIUM CHLORIDE 0.9 % IV SOLN
250.0000 mL | INTRAVENOUS | Status: DC | PRN
  Administered 2017-10-11: 250 mL via INTRAVENOUS

## 2017-10-11 MED ORDER — SODIUM CHLORIDE 0.9 % IV SOLN: INTRAVENOUS | Status: DC

## 2017-10-11 MED ORDER — IOPAMIDOL (ISOVUE-300) INJECTION 61%
INTRAVENOUS | Status: AC
  Filled 2017-10-11: qty 50

## 2017-10-11 MED ORDER — BUPIVACAINE HCL (PF) 0.25 % IJ SOLN
INTRAMUSCULAR | Status: DC | PRN
  Administered 2017-10-11: 20 mL

## 2017-10-11 MED ORDER — SODIUM CHLORIDE 0.9% FLUSH: 3.0000 mL | INTRAVENOUS | Status: DC | PRN

## 2017-10-11 MED ORDER — MIDAZOLAM HCL 5 MG/5ML IJ SOLN
INTRAMUSCULAR | Status: DC | PRN
  Administered 2017-10-11: 1 mg via INTRAVENOUS

## 2017-10-11 MED ORDER — LIDOCAINE HCL (CARDIAC) PF 100 MG/5ML IV SOSY
PREFILLED_SYRINGE | INTRAVENOUS | Status: DC | PRN
  Administered 2017-10-11: 100 mg via INTRAVENOUS

## 2017-10-11 MED ORDER — ONDANSETRON HCL 4 MG/2ML IJ SOLN: 4.0000 mg | Freq: Once | INTRAMUSCULAR | Status: DC | PRN

## 2017-10-11 MED ORDER — MIDAZOLAM HCL 5 MG/5ML IJ SOLN
INTRAMUSCULAR | Status: AC
  Filled 2017-10-11: qty 5

## 2017-10-11 MED ORDER — PROPOFOL 10 MG/ML IV BOLUS
INTRAVENOUS | Status: DC | PRN
  Administered 2017-10-11: 130 mg via INTRAVENOUS

## 2017-10-11 MED ORDER — ONDANSETRON HCL 4 MG/2ML IJ SOLN
INTRAMUSCULAR | Status: DC | PRN
  Administered 2017-10-11: 4 mg via INTRAVENOUS

## 2017-10-11 MED ORDER — CHLORHEXIDINE GLUCONATE 4 % EX LIQD: 60.0000 mL | Freq: Once | CUTANEOUS | Status: AC

## 2017-10-11 MED ORDER — FENTANYL CITRATE (PF) 100 MCG/2ML IJ SOLN
INTRAMUSCULAR | Status: DC | PRN
  Administered 2017-10-11: 50 ug via INTRAVENOUS
  Administered 2017-10-11: 100 ug via INTRAVENOUS

## 2017-10-11 MED ORDER — FENTANYL CITRATE (PF) 250 MCG/5ML IJ SOLN
INTRAMUSCULAR | Status: AC
  Filled 2017-10-11: qty 5

## 2017-10-11 MED ORDER — PROPOFOL 10 MG/ML IV BOLUS
INTRAVENOUS | Status: AC
  Filled 2017-10-11: qty 20

## 2017-10-11 MED ORDER — CEFAZOLIN SODIUM-DEXTROSE 2-3 GM-%(50ML) IV SOLR
INTRAVENOUS | Status: DC | PRN
  Administered 2017-10-11: 2 g via INTRAVENOUS

## 2017-10-11 SURGICAL SUPPLY — 43 items
APPLIER CLIP 5 13 M/L LIGAMAX5 (MISCELLANEOUS)
APPLIER CLIP ROT 10 11.4 M/L (STAPLE) ×4
BLADE CLIPPER SURG (BLADE) IMPLANT
CANISTER SUCT 3000ML PPV (MISCELLANEOUS) ×4 IMPLANT
CHLORAPREP W/TINT 26ML (MISCELLANEOUS) ×4 IMPLANT
CHOLANGIOGRAM CATH TAUT (CATHETERS) ×4 IMPLANT
CLIP APPLIE 5 13 M/L LIGAMAX5 (MISCELLANEOUS) IMPLANT
CLIP APPLIE ROT 10 11.4 M/L (STAPLE) ×2 IMPLANT
COVER SURGICAL LIGHT HANDLE (MISCELLANEOUS) ×4 IMPLANT
DERMABOND ADVANCED (GAUZE/BANDAGES/DRESSINGS) ×2
DERMABOND ADVANCED .7 DNX12 (GAUZE/BANDAGES/DRESSINGS) ×2 IMPLANT
ELECT REM PT RETURN 9FT ADLT (ELECTROSURGICAL) ×4
ELECTRODE REM PT RTRN 9FT ADLT (ELECTROSURGICAL) ×2 IMPLANT
FILTER SMOKE EVAC LAPAROSHD (FILTER) ×4 IMPLANT
GLOVE BIOGEL PI IND STRL 6.5 (GLOVE) ×2 IMPLANT
GLOVE BIOGEL PI INDICATOR 6.5 (GLOVE) ×2
GLOVE ECLIPSE 6.0 STRL STRAW (GLOVE) ×4 IMPLANT
GLOVE SURG SIGNA 7.5 PF LTX (GLOVE) ×4 IMPLANT
GOWN STRL REUS W/ TWL LRG LVL3 (GOWN DISPOSABLE) ×4 IMPLANT
GOWN STRL REUS W/ TWL XL LVL3 (GOWN DISPOSABLE) ×2 IMPLANT
GOWN STRL REUS W/TWL LRG LVL3 (GOWN DISPOSABLE) ×4
GOWN STRL REUS W/TWL XL LVL3 (GOWN DISPOSABLE) ×2
KIT BASIN OR (CUSTOM PROCEDURE TRAY) ×4 IMPLANT
KIT TURNOVER KIT B (KITS) ×4 IMPLANT
NS IRRIG 1000ML POUR BTL (IV SOLUTION) ×4 IMPLANT
PAD ARMBOARD 7.5X6 YLW CONV (MISCELLANEOUS) ×4 IMPLANT
POUCH RETRIEVAL ECOSAC 10 (ENDOMECHANICALS) ×2 IMPLANT
POUCH RETRIEVAL ECOSAC 10MM (ENDOMECHANICALS) ×2
SCISSORS LAP 5X35 DISP (ENDOMECHANICALS) ×4 IMPLANT
SET IRRIG TUBING LAPAROSCOPIC (IRRIGATION / IRRIGATOR) ×4 IMPLANT
SLEEVE ENDOPATH XCEL 5M (ENDOMECHANICALS) ×12 IMPLANT
SPECIMEN JAR SMALL (MISCELLANEOUS) ×4 IMPLANT
STOPCOCK 4 WAY LG BORE MALE ST (IV SETS) ×4 IMPLANT
SUT MNCRL AB 4-0 PS2 18 (SUTURE) ×4 IMPLANT
TOWEL OR 17X24 6PK STRL BLUE (TOWEL DISPOSABLE) ×4 IMPLANT
TOWEL OR 17X26 10 PK STRL BLUE (TOWEL DISPOSABLE) ×4 IMPLANT
TRAY LAPAROSCOPIC MC (CUSTOM PROCEDURE TRAY) ×4 IMPLANT
TROCAR XCEL BLUNT TIP 100MML (ENDOMECHANICALS) ×4 IMPLANT
TROCAR XCEL NON-BLD 11X100MML (ENDOMECHANICALS) IMPLANT
TROCAR XCEL NON-BLD 5MMX100MML (ENDOMECHANICALS) ×4 IMPLANT
TUBING EXTENTION W/L.L. (IV SETS) ×4 IMPLANT
TUBING INSUFFLATION (TUBING) ×4 IMPLANT
WATER STERILE IRR 1000ML POUR (IV SOLUTION) ×4 IMPLANT

## 2017-10-11 SURGICAL SUPPLY — 9 items
CABLE ADAPT CONN TEMP 6FT (ADAPTER) ×2 IMPLANT
CABLE ADAPT PACING TEMP 12FT (ADAPTER) ×2 IMPLANT
CATH S G BIP PACING (SET/KITS/TRAYS/PACK) ×2 IMPLANT
PACK EP LATEX FREE (CUSTOM PROCEDURE TRAY) ×1
PACK EP LF (CUSTOM PROCEDURE TRAY) ×1 IMPLANT
PAD DEFIB LIFELINK (PAD) ×2 IMPLANT
SHEATH PINNACLE 6F 10CM (SHEATH) ×2 IMPLANT
SLEEVE REPOSITIONING LENGTH 30 (MISCELLANEOUS) ×2 IMPLANT
TRAY PACEMAKER INSERTION (PACKS) IMPLANT

## 2017-10-11 NOTE — Plan of Care (Signed)
Patient is calm and appears to understand whats happening. Educated re Consent signed .

## 2017-10-11 NOTE — Anesthesia Procedure Notes (Addendum)
Procedure Name: Intubation Date/Time: 10/11/2017 2:44 PM Performed by: Shirlyn Goltz, CRNA Pre-anesthesia Checklist: Patient identified Patient Re-evaluated:Patient Re-evaluated prior to induction Oxygen Delivery Method: Circle system utilized Preoxygenation: Pre-oxygenation with 100% oxygen Induction Type: IV induction Ventilation: Two handed mask ventilation required and Oral airway inserted - appropriate to patient size Laryngoscope Size: Mac and 4 Grade View: Grade III Tube type: Oral Tube size: 7.5 mm Number of attempts: 1 Airway Equipment and Method: Stylet and Oral airway Placement Confirmation: positive ETCO2,  CO2 detector and breath sounds checked- equal and bilateral Secured at: 23 cm Tube secured with: Tape Dental Injury: Teeth and Oropharynx as per pre-operative assessment  Comments: SRNA - briana lafuria

## 2017-10-11 NOTE — Progress Notes (Signed)
Progress Note  Patient Name: Matthew Sellers Date of Encounter: 10/11/2017  Primary Cardiologist: No primary care provider on file.   Subjective   No complaints.   Inpatient Medications    Scheduled Meds: . amLODipine  10 mg Oral Daily  . aspirin EC  81 mg Oral Daily  . Brinzolamide-Brimonidine  1 drop Ophthalmic BID  . enoxaparin (LOVENOX) injection  40 mg Subcutaneous Q24H  . insulin aspart  0-15 Units Subcutaneous Q4H  . insulin glargine  20 Units Subcutaneous QHS  . latanoprost  1 drop Both Eyes QHS  . metoCLOPramide  10 mg Oral Q6H  .  morphine injection  4 mg Intravenous Once  . ondansetron (ZOFRAN) IV  4 mg Intravenous Once  . pravastatin  20 mg Oral QHS  . sodium chloride flush  3 mL Intravenous Q12H   Continuous Infusions: . dextrose 5 % and 0.45 % NaCl with KCl 20 mEq/L 100 mL/hr at 10/11/17 0054  . piperacillin-tazobactam (ZOSYN)  IV 3.375 g (10/11/17 0558)   PRN Meds: acetaminophen **OR** acetaminophen, morphine injection, ondansetron **OR** ondansetron (ZOFRAN) IV   Vital Signs    Vitals:   10/11/17 0849 10/11/17 0854 10/11/17 0854 10/11/17 0859  BP: (!) 169/71  (!) 185/66 (!) 180/69  Pulse: (!) 47 (!) 46 (!) 46 (!) 48  Resp: (!) 71 (!) 27 (!) 27 20  Temp:      TempSrc:      SpO2: 97% 97% 97% 92%  Weight:      Height:        Intake/Output Summary (Last 24 hours) at 10/11/2017 0921 Last data filed at 10/10/2017 1800 Gross per 24 hour  Intake 211.28 ml  Output 650 ml  Net -438.72 ml   Filed Weights   10/09/17 0940 10/09/17 1729  Weight: 240 lb (108.9 kg) 240 lb 15.4 oz (109.3 kg)    Telemetry    nsr with CHB - Personally Reviewed  ECG    none - Personally Reviewed  Physical Exam   GEN: No acute distress.   Neck: 6 cm JVD Cardiac: Reg brady, no murmurs, rubs, or gallops.  Respiratory: Clear to auscultation bilaterally. GI: Soft, nontender, non-distended  MS: No edema; No deformity. Neuro:  Nonfocal  Psych: Normal affect    Labs    Chemistry Recent Labs  Lab 10/08/17 0134 10/09/17 0938 10/10/17 0413 10/11/17 0340  NA  --  138 138 140  K  --  3.3* 2.8* 3.0*  CL  --  100 97* 101  CO2  --  28 28 27   GLUCOSE  --  223* 98 118*  BUN  --  25* 24* 15  CREATININE  --  1.61* 1.61* 1.58*  CALCIUM  --  9.1 9.0 8.8*  PROT 7.0 6.6  --  6.4*  ALBUMIN 4.0 3.6  --  3.0*  AST 17 20  --  19  ALT 14 12  --  14  ALKPHOS 37* 38  --  47  BILITOT 1.1 1.9*  --  2.9*  GFRNONAA  --  40* 40* 41*  GFRAA  --  47* 47* 48*  ANIONGAP  --  10 13 12      Hematology Recent Labs  Lab 10/09/17 0938 10/10/17 0413 10/11/17 0340  WBC 11.3* 9.6 8.1  RBC 3.96* 3.89* 3.77*  HGB 11.3* 11.0* 10.6*  HCT 35.4* 34.9* 33.2*  MCV 89.4 89.7 88.1  MCH 28.5 28.3 28.1  MCHC 31.9 31.5 31.9  RDW 13.6 13.8 13.7  PLT 240 249 254    Cardiac Enzymes Recent Labs  Lab 10/09/17 0938 10/09/17 1732 10/09/17 2149 10/10/17 0413  TROPONINI 0.04* 0.03* 0.04* 0.04*    Recent Labs  Lab 10/07/17 2130 10/08/17 0146  TROPIPOC 0.00 0.01     BNPNo results for input(s): BNP, PROBNP in the last 168 hours.   DDimer No results for input(s): DDIMER in the last 168 hours.   Radiology    Nm Hepato W/eject Fract  Result Date: 10/10/2017 CLINICAL DATA:  Chronic RIGHT UPPER quadrant abdominal pain which acutely worsened 3 days ago, associated with nausea and vomiting. EXAM: NUCLEAR MEDICINE HEPATOBILIARY IMAGING TECHNIQUE: Sequential images of the abdomen were obtained out to 90 minutes following intravenous administration of radiopharmaceutical. At 90 minutes, 3.0 mg of morphine were administered intravenously and imaging was performed for an additional 30 minutes. RADIOPHARMACEUTICALS:  5.3 mCi Tc-3083m Choletec IV COMPARISON:  No prior nuclear medicine hepatobiliary scan. RIGHT UPPER quadrant abdominal ultrasound yesterday which demonstrated gallbladder sludge but no sonographic evidence of acute cholecystitis. FINDINGS: Hepatic uptake of radiotracer  is normal and there is prompt excretion into the biliary system. Small bowel activity is visible at approximately 10 minutes. The gallbladder was not visible out to 90 minutes. There is no evidence of tracer retention by the liver. The gallbladder was not visible after intravenous administration of morphine. IMPRESSION: Nonvisualization of the gallbladder after intravenous morphine administration indicating cystic duct obstruction and therefore acute cholecystitis. These results will be called to the ordering clinician or representative by the Radiologist Assistant, and communication documented in the PACS or zVision Dashboard. Electronically Signed   By: Hulan Saashomas  Lawrence M.D.   On: 10/10/2017 12:19   [place] Abdomen Limited Ruq  Result Date: 10/09/2017 CLINICAL DATA:  Right upper quadrant pain EXAM: ULTRASOUND ABDOMEN LIMITED RIGHT UPPER QUADRANT COMPARISON:  10/08/2017 FINDINGS: Gallbladder: Gallbladder is physiologically distended and contains small calculi biliary sludge along the dependent aspect. No wall thickening or pericholecystic fluid is identified. No sonographic Murphy sign noted by sonographer. Common bile duct: Diameter: 5.3 mm Liver: No focal lesion identified. Within normal limits in parenchymal echogenicity. Portal vein is patent on color Doppler imaging with normal direction of blood flow towards the liver. IMPRESSION: Cholelithiasis with biliary sludge is redemonstrated without secondary signs of acute cholecystitis. Electronically Signed   By: Tollie Ethavid  Kwon M.D.   On: 10/09/2017 14:57    Cardiac Studies   none  Patient Profile     77 y.o. male admitted with abdominal pain/CHB who has undergone temporary PM insertion this morning in anticipation of lap chole.  Assessment & Plan    1. CHB - he has undergone temporary PM insertion. I have his device programmed VVI 40. I very much do not want him to pace if at all possible until his Salome Holmeserm PM has been placed. If we pace him at 60-70/min, we  will make him PM dependent. Only pace above 40 for hypotension. 2. Lap/chole - she will undergo later today.   For questions or updates, please contact [facility] Please consult [email or web address] for contact info under Cardiology/STEMI.      Signed, Lewayne BuntingGregg Montrez [place], MD  10/11/2017, 9:21 AM  Patient ID: Matthew Sellers, male   DOB: February 26, 1941, 77 y.o.   MRN: [id number]

## 2017-10-11 NOTE — Anesthesia Postprocedure Evaluation (Signed)
Anesthesia Post Note  Patient: Matthew Sellers  Procedure(s) Performed: LAPAROSCOPIC CHOLECYSTECTOMY WITH INTRAOPERATIVE CHOLANGIOGRAM (N/A Abdomen)     Patient location during evaluation: PACU Anesthesia Type: General Level of consciousness: awake and alert Pain management: pain level controlled Vital Signs Assessment: post-procedure vital signs reviewed and stable Respiratory status: spontaneous breathing, nonlabored ventilation, respiratory function stable and patient connected to nasal cannula oxygen Cardiovascular status: blood pressure returned to baseline and stable Postop Assessment: no apparent nausea or vomiting Anesthetic complications: no    Last Vitals:  Vitals:   10/11/17 1845 10/11/17 1900  BP: (!) 168/57 (!) 164/52  Pulse: (!) 43 (!) 44  Resp:  20  Temp: 37.2 C   SpO2: 100% 96%    Last Pain:  Vitals:   10/11/17 1845  TempSrc: Oral  PainSc: 0-No pain                 Nellie Pester DAVID

## 2017-10-11 NOTE — Discharge Instructions (Addendum)
Supplemental Discharge Instructions for  Pacemaker/Defibrillator Patients  Activity No heavy lifting or vigorous activity with your left/right arm for 6 to 8 weeks.  Do not raise your left/right arm above your head for one week.  Gradually raise your affected arm as drawn below.           __      10/16/17                 10/17/17                      10/18/17                   10/19/17  NO DRIVING for 1 week    ; you may begin driving on   1/91/47  .  WOUND CARE - Keep the wound area clean and dry.  Do not get this area wet for one week. No showers for one week; you may shower on  10/19/17   . - The tape/steri-strips on your wound will fall off; do not pull them off.  No bandage is needed on the site.  DO  NOT apply any creams, oils, or ointments to the wound area. - If you notice any drainage or discharge from the wound, any swelling or bruising at the site, or you develop a fever > 101? F after you are discharged home, call the office at once.  Special Instructions - You are still able to use cellular telephones; use the ear opposite the side where you have your pacemaker/defibrillator.  Avoid carrying your cellular phone near your device. - When traveling through airports, show security personnel your identification card to avoid being screened in the metal detectors.  Ask the security personnel to use the hand wand. - Avoid arc welding equipment, MRI testing (magnetic resonance imaging), TENS units (transcutaneous nerve stimulators).  Call the office for questions about other devices. - Avoid electrical appliances that are in poor condition or are not properly grounded. - Microwave ovens are safe to be near or to operate.     Please arrive at least 30 min before your appointment to complete your check in paperwork.  If you are unable to arrive 30 min prior to your appointment time we may have to cancel or reschedule you.  LAPAROSCOPIC SURGERY: POST OP INSTRUCTIONS  1. DIET: Follow  a light bland diet the first 24 hours after arrival home, such as soup, liquids, crackers, etc. Be sure to include lots of fluids daily. Avoid fast food or heavy meals as your are more likely to get nauseated. Eat a low fat the next few days after surgery.  2. Take your usually prescribed home medications unless otherwise directed. 3. PAIN CONTROL:  1. Pain is best controlled by a usual combination of three different methods TOGETHER:  1. Ice/Heat 2. Over the counter pain medication 3. Prescription pain medication 2. Most patients will experience some swelling and bruising around the incisions. Ice packs or heating pads (30-60 minutes up to 6 times a day) will help. Use ice for the first few days to help decrease swelling and bruising, then switch to heat to help relax tight/sore spots and speed recovery. Some people prefer to use ice alone, heat alone, alternating between ice & heat. Experiment to what works for you. Swelling and bruising can take several weeks to resolve.  3. It is helpful to take an over-the-counter pain medication regularly for the first few  weeks. Choose one of the following that works best for you:  1. Naproxen (Aleve, etc) Two 220mg  tabs twice a day 2. Ibuprofen (Advil, etc) Three 200mg  tabs four times a day (every meal & bedtime) 3. Acetaminophen (Tylenol, etc) 500-650mg  four times a day (every meal & bedtime) 4. A prescription for pain medication (such as oxycodone, hydrocodone, etc) should be given to you upon discharge. Take your pain medication as prescribed.  1. If you are having problems/concerns with the prescription medicine (does not control pain, nausea, vomiting, rash, itching, etc), please call us [phone] to see if we need to switch you to a different pain medicine that will work better for you and/or control your side effect better. 2. If you need a refill on your pain medication, please contact your pharmacy. They will contact our office to request  authorization. Prescriptions will not be filled after 5 pm or on week-ends. 4. Avoid getting constipated. Between the surgery and the pain medications, it is common to experience some constipation. Increasing fluid intake and taking a fiber supplement (such as Metamucil, Citrucel, FiberCon, MiraLax, etc) 1-2 times a day regularly will usually help prevent this problem from occurring. A mild laxative (prune juice, Milk of Magnesia, MiraLax, etc) should be taken according to package directions if there are no bowel movements after 48 hours.  5. Watch out for diarrhea. If you have many loose bowel movements, simplify your diet to bland foods & liquids for a few days. Stop any stool softeners and decrease your fiber supplement. Switching to mild anti-diarrheal medications (Kayopectate, Pepto Bismol) can help. If this worsens or does not improve, please call us. 6. Wash / shower every day. You may shower over the dressings as they are waterproof. Continue to shower over incision(s) after the dressing is off. If there is glue over the incisions try not to pick it off, let it fall off naturally. 7. Remove your waterproof bandages 2 days after surgery. You may leave the incision open to air. You may replace a dressing/Band-Aid to cover the incision for comfort if you wish.  8. ACTIVITIES as tolerated:  1. You may resume regular (light) daily activities beginning the next day--such as daily self-care, walking, climbing stairs--gradually increasing activities as tolerated. If you can walk 30 minutes without difficulty, it is safe to try more intense activity such as jogging, treadmill, bicycling, low-impact aerobics, swimming, etc. 2. Save the most intensive and strenuous activity for last such as sit-ups, heavy lifting, contact sports, etc Refrain from any heavy lifting or straining until you are off narcotics for pain control. For the first 2-3 weeks do not lift over 10-15lb.  3. DO NOT PUSH THROUGH PAIN. Let pain  be your guide: If it hurts to do something, don't do it. Pain is your body warning you to avoid that activity for another week until the pain goes down. 4. You may drive when you are no longer taking prescription pain medication, you can comfortably wear a seatbelt, and you can safely maneuver your car and apply brakes. 5. You may have sexual intercourse when it is comfortable.  9. FOLLOW UP in our office  1. Please call [facility] at [phone] to set up an appointment to see your surgeon in the office for a follow-up appointment approximately 2-3 weeks after your surgery. 2. Make sure that you call for this appointment the day you arrive home to insure a convenient appointment time.      10. IF YOU HAVE  DISABILITY OR FAMILY LEAVE FORMS, BRING THEM TO THE               OFFICE FOR PROCESSING.   WHEN TO CALL us [phone]:  1. Poor pain control 2. Reactions / problems with new medications (rash/itching, nausea, etc)  3. Fever over 101.5 F (38.5 C) 4. Inability to urinate 5. Nausea and/or vomiting 6. Worsening swelling or bruising 7. Continued bleeding from incision. 8. Increased pain, redness, or drainage from the incision  The clinic staff is available to answer your questions during regular business hours (8:30am-5pm). Please dont hesitate to call and ask to speak to one of our nurses for clinical concerns.  If you have a medical emergency, go to the nearest emergency room or call 911.  A surgeon from [facility] is always on call at the [facility], [place]  [address], [place], [place] [address] ?  MAIN: [phone] ? TOLL FREE: [phone] ?  FAX [phone]  [email or web address]

## 2017-10-11 NOTE — Anesthesia Preprocedure Evaluation (Signed)
Anesthesia Evaluation  Patient identified by MRN, date of birth, ID band Patient awake    Reviewed: Allergy & Precautions, NPO status , Patient's Chart, lab work & pertinent test results  Airway Mallampati: I  TM Distance: >3 FB Neck ROM: Full    Dental   Pulmonary    Pulmonary exam normal        Cardiovascular hypertension, Pt. on medications + CAD  Normal cardiovascular exam  Temporary pacemaker inserted today   Neuro/Psych    GI/Hepatic   Endo/Other  diabetes  Renal/GU Renal InsufficiencyRenal disease     Musculoskeletal   Abdominal   Peds  Hematology   Anesthesia Other Findings   Reproductive/Obstetrics                             Anesthesia Physical Anesthesia Plan  ASA: III  Anesthesia Plan: General   Post-op Pain Management:    Induction: Intravenous  PONV Risk Score and Plan: 2 and Ondansetron and Treatment may vary due to age or medical condition  Airway Management Planned: Oral ETT  Additional Equipment:   Intra-op Plan:   Post-operative Plan: Extubation in OR  Informed Consent: I have reviewed the patients History and Physical, chart, labs and discussed the procedure including the risks, benefits and alternatives for the proposed anesthesia with the patient or authorized representative who has indicated his/her understanding and acceptance.     Plan Discussed with: CRNA and Surgeon  Anesthesia Plan Comments:         Anesthesia Quick Evaluation

## 2017-10-11 NOTE — Progress Notes (Signed)
PROGRESS NOTE  MILO SCHREIER ZOX:[id number] DOB: Nov 24, 1940 DOA: 10/09/2017 PCP: [facility]   LOS: 2 days   Brief Narrative / Interim history: Briefly, patient is a 77 year old male with hypertension, diabetes, hyperlipidemia, CAD, bradycardia presented with upper abdominal pain.  Patient initially presented to the ER on Saturday, 7/7 for chest and abdominal pain with nausea and vomiting.  Patient had negative CTA chest and abdomen and was discharged home.  Patient continued to have abdominal cramps, started having severe periumbilical, mid epigastric and right upper quadrant abdominal pain.  Patient also complained of dizziness on standing up, chest pressure and low heart rate on 7/7.  There was a concern for possible heart block.  Assessment & Plan: Principal Problem:   Abdominal pain Active Problems:   Diabetes (HCC)   Hypertension   Hyperlipidemia   Fatty liver   CKD (chronic kidney disease) stage 3, GFR 30-59 ml/min (HCC)   Bradycardia   Abdominal pain with gallbladder sludge and gallstones, ?  Cholecystitis -Ultrasound abdomen showed gallbladder sludge, gallstones without obvious acute cholecystitis -General surgery following, plan for laparoscopic cholecystectomy today after temporary pacemaker -Continue IV Zosyn, remains intermittently febrile -HIDA scan showed nonvisualization of the gallbladder indicating cystic duct obstruction and acute cholecystitis  Bradycardia with intermittent complete heart block -Initial EKG on 7/7 had shown heart block and patient was asked to come back to the ED -History reviewed shows complete heart block -EP cardiology consulted, discussed with Dr. Ladona Ridgel this morning, will place a temporary pacemaker for now and change to a permanent pacemaker perhaps on Friday  Diabetes (HCC) mellitus type II, uncontrolled with hypoglycemia -Hemoglobin A1c 7.5 -decrease Lantus to 20 units, continue sliding scale insulin moderate every 4 hours,  moderate, currently n.p.o. -Continue D5 half normal with K supplementation   Hypokalemia  -Replaced IV, magnesium 2.0  Hypertension -Continue Norvasc, hold lisinopril, HCTZ due to renal insufficiency  Mild acute on CKD (chronic kidney disease) stage 3, GFR 30-59 ml/min (HCC) -Baseline creatinine 1.2-1.3, admitted with creatinine of 1.6, 1.5 morning -Continue IV fluid hydration -Hold lisinopril HCTZ    DVT prophylaxis: Lovenox Code Status: Full code Family Communication: wife at bedside Disposition Plan: home when ready  Consultants:   Cardiology   General surgery   Procedures:   2D echo pending  Antimicrobials:  Zosyn 7/8 >>  Subjective: -still with abdominal pain. Waiting on surgery.   Objective: Vitals:   10/11/17 1100 10/11/17 1128 10/11/17 1200 10/11/17 1251  BP: (!) 146/50  (!) 158/54   Pulse: (!) 40  (!) 46   Resp: 17  18   Temp:  99.7 F (37.6 C) 99.7 F (37.6 C)   TempSrc:  Oral Oral   SpO2: 97%  94%   Weight:    109.3 kg (240 lb 15.4 oz)  Height:    5\' 10"  (1.778 m)    Intake/Output Summary (Last 24 hours) at 10/11/2017 1329 Last data filed at 10/11/2017 1200 Gross per 24 hour  Intake 2078.51 ml  Output 200 ml  Net 1878.51 ml   Filed Weights   10/09/17 0940 10/09/17 1729 10/11/17 1251  Weight: 108.9 kg (240 lb) 109.3 kg (240 lb 15.4 oz) 109.3 kg (240 lb 15.4 oz)    Examination:  Constitutional: NAD Eyes: lids and conjunctivae normal ENMT: Mucous membranes are moist. No oropharyngeal exudates Respiratory: clear to auscultation bilaterally, no wheezing, no crackles.  Cardiovascular: Regular rate and rhythm, no murmurs / rubs / gallops. No LE edema.  Bradycardia   Abdomen:  RUQ tenderness. Bowel sounds positive.  Musculoskeletal: no clubbing / cyanosis.  Skin: no rashes Neurologic: CN 2-12 grossly intact. Strength 5/5 in all 4.    Data Reviewed: I have independently reviewed following labs and imaging studies   CBC: Recent Labs    Lab 10/07/17 2115 10/09/17 0938 10/10/17 0413 10/11/17 0340  WBC 7.7 11.3* 9.6 8.1  HGB 12.0* 11.3* 11.0* 10.6*  HCT 38.3* 35.4* 34.9* 33.2*  MCV 90.3 89.4 89.7 88.1  PLT 289 240 249 254   Basic Metabolic Panel: Recent Labs  Lab 10/07/17 2115 10/09/17 0938 10/10/17 0413 10/10/17 1128 10/11/17 0340  NA 138 138 138  --  140  K 3.4* 3.3* 2.8*  --  3.0*  CL 102 100 97*  --  101  CO2 25 28 28   --  27  GLUCOSE 233* 223* 98  --  118*  BUN 22 25* 24*  --  15  CREATININE 1.30* 1.61* 1.61*  --  1.58*  CALCIUM 9.9 9.1 9.0  --  8.8*  MG  --  1.7  --  2.0  --   PHOS  --  3.2  --   --   --    GFR: Estimated Creatinine Clearance: 50 mL/min (A) (by C-G formula based on SCr of 1.58 mg/dL (H)). Liver Function Tests: Recent Labs  Lab 10/08/17 0134 10/09/17 0938 10/11/17 0340  AST 17 20 19   ALT 14 12 14   ALKPHOS 37* 38 47  BILITOT 1.1 1.9* 2.9*  PROT 7.0 6.6 6.4*  ALBUMIN 4.0 3.6 3.0*   Recent Labs  Lab 10/09/17 0938  LIPASE 36   No results for input(s): AMMONIA in the last 168 hours. Coagulation Profile: No results for input(s): INR, PROTIME in the last 168 hours. Cardiac Enzymes: Recent Labs  Lab 10/09/17 0938 10/09/17 1732 10/09/17 2149 10/10/17 0413  TROPONINI 0.04* 0.03* 0.04* 0.04*   BNP (last 3 results) No results for input(s): PROBNP in the last 8760 hours. HbA1C: Recent Labs    10/09/17 1836 10/10/17 1128  HGBA1C 7.5* 7.4*   CBG: Recent Labs  Lab 10/10/17 2031 10/11/17 0100 10/11/17 0522 10/11/17 0805 10/11/17 1127  GLUCAP 109* 127* 117* 169* 186*   Lipid Profile: No results for input(s): CHOL, HDL, LDLCALC, TRIG, CHOLHDL, LDLDIRECT in the last 72 hours. Thyroid Function Tests: Recent Labs    10/09/17 1105  TSH 2.703   Anemia Panel: No results for input(s): VITAMINB12, FOLATE, FERRITIN, TIBC, IRON, RETICCTPCT in the last 72 hours. Urine analysis:    Component Value Date/Time   COLORURINE YELLOW 08/30/2016 0304   APPEARANCEUR CLEAR  08/30/2016 0304   LABSPEC 1.024 08/30/2016 0304   PHURINE 5.0 08/30/2016 0304   GLUCOSEU 150 (A) 08/30/2016 0304   HGBUR NEGATIVE 08/30/2016 0304   BILIRUBINUR NEGATIVE 08/30/2016 0304   KETONESUR 5 (A) 08/30/2016 0304   PROTEINUR 30 (A) 08/30/2016 0304   UROBILINOGEN 1.0 06/30/2014 0209   NITRITE NEGATIVE 08/30/2016 0304   LEUKOCYTESUR NEGATIVE 08/30/2016 0304   Sepsis Labs: Invalid input(s): PROCALCITONIN, LACTICIDVEN  Recent Results (from the past 240 hour(s))  MRSA PCR Screening     Status: None   Collection Time: 10/09/17  5:22 PM  Result Value Ref Range Status   MRSA by PCR NEGATIVE NEGATIVE Final    Comment:        The GeneXpert MRSA Assay (FDA approved for NASAL specimens only), is one component of a comprehensive MRSA colonization surveillance program. It is not intended to diagnose MRSA infection nor  to guide or monitor treatment for MRSA infections. Performed at [facility], [address] [place], [place] [address]   Culture, blood (x 2)     Status: None (Preliminary result)   Collection Time: 10/09/17  6:36 PM  Result Value Ref Range Status   Specimen Description BLOOD LEFT ANTECUBITAL  Final   Special Requests   Final    BOTTLES DRAWN AEROBIC ONLY Blood Culture adequate volume   Culture   Final    NO GROWTH < 24 HOURS Performed at [facility], [address] [place], [place] [address]    Report Status PENDING  Incomplete  Culture, blood (x 2)     Status: None (Preliminary result)   Collection Time: 10/09/17  6:36 PM  Result Value Ref Range Status   Specimen Description BLOOD LEFT HAND  Final   Special Requests   Final    BOTTLES DRAWN AEROBIC ONLY Blood Culture results may not be optimal due to an inadequate volume of blood received in culture bottles   Culture   Final    NO GROWTH < 24 HOURS Performed at [facility], [address] [place], [place] [address]    Report Status PENDING  Incomplete  Surgical PCR screen      Status: None   Collection Time: 10/11/17  5:41 AM  Result Value Ref Range Status   MRSA, PCR NEGATIVE NEGATIVE Final   Staphylococcus aureus NEGATIVE NEGATIVE Final    Comment: (NOTE) The Xpert SA Assay (FDA approved for NASAL specimens in patients 75 years of age and older), is one component of a comprehensive surveillance program. It is not intended to diagnose infection nor to guide or monitor treatment. Performed at [facility], [address] [place], [place] [address]       Radiology Studies: Nm Hepato W/eject Fract  Result Date: 10/10/2017 CLINICAL DATA:  Chronic RIGHT UPPER quadrant abdominal pain which acutely worsened 3 days ago, associated with nausea and vomiting. EXAM: NUCLEAR MEDICINE HEPATOBILIARY IMAGING TECHNIQUE: Sequential images of the abdomen were obtained out to 90 minutes following intravenous administration of radiopharmaceutical. At 90 minutes, 3.0 mg of morphine were administered intravenously and imaging was performed for an additional 30 minutes. RADIOPHARMACEUTICALS:  5.3 mCi Tc-8355m Choletec IV COMPARISON:  No prior nuclear medicine hepatobiliary scan. RIGHT UPPER quadrant abdominal ultrasound yesterday which demonstrated gallbladder sludge but no sonographic evidence of acute cholecystitis. FINDINGS: Hepatic uptake of radiotracer is normal and there is prompt excretion into the biliary system. Small bowel activity is visible at approximately 10 minutes. The gallbladder was not visible out to 90 minutes. There is no evidence of tracer retention by the liver. The gallbladder was not visible after intravenous administration of morphine. IMPRESSION: Nonvisualization of the gallbladder after intravenous morphine administration indicating cystic duct obstruction and therefore acute cholecystitis. These results will be called to the ordering clinician or representative by the Radiologist Assistant, and communication documented in the PACS or zVision Dashboard.  Electronically Signed   By: Hulan Saashomas  Lawrence M.D.   On: 10/10/2017 12:19   [place] Abdomen Limited Ruq  Result Date: 10/09/2017 CLINICAL DATA:  Right upper quadrant pain EXAM: ULTRASOUND ABDOMEN LIMITED RIGHT UPPER QUADRANT COMPARISON:  10/08/2017 FINDINGS: Gallbladder: Gallbladder is physiologically distended and contains small calculi biliary sludge along the dependent aspect. No wall thickening or pericholecystic fluid is identified. No sonographic Murphy sign noted by sonographer. Common bile duct: Diameter: 5.3 mm Liver: No focal lesion identified. Within normal limits  in parenchymal echogenicity. Portal vein is patent on color Doppler imaging with normal direction of blood flow towards the liver. IMPRESSION: Cholelithiasis with biliary sludge is redemonstrated without secondary signs of acute cholecystitis. Electronically Signed   By: Tollie Eth M.D.   On: 10/09/2017 14:57     Scheduled Meds: . [MAR Hold] amLODipine  10 mg Oral Daily  . [MAR Hold] aspirin EC  81 mg Oral Daily  . [MAR Hold] Brinzolamide-Brimonidine  1 drop Ophthalmic BID  . [MAR Hold] enoxaparin (LOVENOX) injection  40 mg Subcutaneous Q24H  . [MAR Hold] insulin aspart  0-15 Units Subcutaneous Q4H  . [MAR Hold] insulin glargine  20 Units Subcutaneous QHS  . [MAR Hold] latanoprost  1 drop Both Eyes QHS  . [MAR Hold] metoCLOPramide  10 mg Oral Q6H  . [MAR Hold]  morphine injection  4 mg Intravenous Once  . [MAR Hold] ondansetron (ZOFRAN) IV  4 mg Intravenous Once  . [MAR Hold] pravastatin  20 mg Oral QHS  . [MAR Hold] sodium chloride flush  3 mL Intravenous Q12H  . [MAR Hold] sodium chloride flush  3 mL Intravenous Q12H   Continuous Infusions: . [MAR Hold] sodium chloride    . ceFAZolin    . dextrose 5 % and 0.45 % NaCl with KCl 20 mEq/L 100 mL/hr at 10/11/17 1200  . lactated ringers    . [MAR Hold] piperacillin-tazobactam (ZOSYN)  IV 3.375 g (10/11/17 0558)     Pamella Pert, MD, PhD [facility] Pager [phone]  [phone]  If 7PM-7AM, please contact night-coverage [email or web address] Password [email or web address] 10/11/2017, 1:29 PM

## 2017-10-11 NOTE — Progress Notes (Signed)
Temporary pacemaker in place through the right groin.  To proceed with lap chole with IOC.  Discussed with patient and answered questions.  Ovidio Kinavid Diar Berkel, MD, [facility] Pager: [phone] Office phone:  [phone]

## 2017-10-11 NOTE — Op Note (Addendum)
10/11/2017  4:42 PM  PATIENT:  Matthew Sellers, 77 y.o., male, MRN: [id number]  PREOP DIAGNOSIS:  acute cholelithiasis, cholelithiasis  POSTOP DIAGNOSIS:   Acute gangrenous cholelithiasis, cholelithiasis  PROCEDURE:   Procedure(s): LAPAROSCOPIC CHOLECYSTECTOMY WITH INTRAOPERATIVE CHOLANGIOGRAM  SURGEON:   Ovidio Kinavid Arnoldo Hildreth, M.D.  ASSISTANT:   C. Cliffton AstersWhite, M.D.  ANESTHESIA:   general  Anesthesiologist: Arta Brucessey, Kevin, MD CRNA: Adonis Housekeeperongell, Janna M, CRNA; Flowers, Rokoshi T, Tax adviserCRNA  General  ASA: 3E  EBL:  minimal  ml  BLOOD ADMINISTERED: none  DRAINS: none   LOCAL MEDICATIONS USED:   30 cc of 1/4% marcaine  SPECIMEN:   Gall bladder  COUNTS CORRECT:  YES  INDICATIONS FOR PROCEDURE:  Matthew MachoHoward F Epple is a 77 y.o. (DOB: [birth year]) AA male whose primary care physician is [facility] and comes for cholecystectomy.  He was admitted on 10/09/2017 with abdominal pain and bradycardia.  He was found to have acute cholecystitis.  He had a temporary pacemaker through his right groin this AM.   The indications and risks of the gall bladder surgery were explained to the patient.  The risks include, but are not limited to, infection, bleeding, common bile duct injury and open surgery.  SURGERY:  The patient was taken to OR room [facility] at [facility].  The abdomen was prepped with chloroprep.  The patient was on Zosyn  the beginning of the operation.   A time out was held and the surgical checklist run.   An infraumbilical incision was made into the abdominal cavity.  A 12 mm Hasson trocar was inserted into the abdominal cavity through the infraumbilical incision and secured with a 0 Vicryl suture.  Four additional trocars were inserted: a 10 mm trocar in the sub-xiphoid location, a 5 mm trocar in the right mid subcostal area, a 5 mm trocar midway between the umbilicus and xiphoid bone, and a 5 mm trocar in the right lateral subcostal area.   The abdomen was explored and the liver, stomach,  and bowel that could be seen were unremarkable.   The gall bladder was acutely inflamed, had infarcted patches in the gall bladder wall, and was encased in omentum in the right upper quadrant of the abdomen.  I first decompressed the gall bladder because it was tense and under pressure.  I grasped the gall bladder and rotated it cephalad.  The dissection of the gall bladder was tedious because of the inflammation.  I placed a 5th trocar for exposure.  The disssection was carried down to the gall bladder/cystic duct junction and the cystic duct isolated.  A clip was placed on the gall bladder side of the cystic duct.   An intra-operative cholangiogram was shot.   The intra-operative cholangiogram was shot using a cut off Taut catheter placed through a 14 gauge angiocath in the RUQ.  The Taut catheter was inserted in the cut cystic duct and secured with an endoclip.  A cholangiogram was shot with 10 cc of 1/2 strength Isoview.  Using fluoroscopy, the cholangiogram showed the flow of contrast into the common bile duct, up the hepatic radicals, and into the duodenum.  There was no mass or obstruction.  This was a normal intra-operative cholangiogram.   The Taut catheter was removed.  The cystic duct was tripley endoclipped and the cystic artery was identified and clipped.  The gall bladder was bluntly and sharpley dissected from the gall bladder bed.   After the gall bladder was removed from the liver, the  gall bladder bed and [place] were inspected.  There was no bleeding or bile leak.  The gall bladder was placed in a Ecosac bag and delivered through the umbilicus.  There was some spillage of stones, but these were recovered with irrigation and the stone grasper.   The abdomen was irrigated with 3,000 cc saline.   The trocars were then removed.  I infiltrated 30cc of 1/4% Marcaine into the incisions.  The umbilical port closed with a 0 Vicryl suture and the skin closed with 4-0 Monocryl.  The skin  was painted with DermaBond.  The patient's sponge and needle count were correct.  The patient was transported to the RR in good condition.   I have a surgeon as a first assist to retract, expose, and assist on this difficult operation.   He post op course will be dictated by his cardiac status.  Ovidio Kin, MD, [facility] Pager: [phone] Office phone:  [phone]

## 2017-10-11 NOTE — Progress Notes (Signed)
  Echocardiogram 2D Echocardiogram has been performed.  Delcie RochENNINGTON, Karver Fadden 10/11/2017, 12:35 PM

## 2017-10-11 NOTE — Transfer of Care (Signed)
Immediate Anesthesia Transfer of Care Note  Patient: Matthew Sellers  Procedure(s) Performed: LAPAROSCOPIC CHOLECYSTECTOMY WITH INTRAOPERATIVE CHOLANGIOGRAM (N/A Abdomen)  Patient Location: PACU  Anesthesia Type:General  Level of Consciousness: awake and alert   Airway & Oxygen Therapy: Patient Spontanous Breathing and Patient connected to nasal cannula oxygen  Post-op Assessment: Report given to RN, Post -op Vital signs reviewed and stable and Patient moving all extremities  Post vital signs: Reviewed and stable  Last Vitals:  Vitals Value Taken Time  BP 165/48 10/11/2017  5:00 PM  Temp    Pulse 88 10/11/2017  5:06 PM  Resp 18 10/11/2017  5:09 PM  SpO2 95 % 10/11/2017  5:06 PM  Vitals shown include unvalidated device data.  Last Pain:  Vitals:   10/11/17 1200  TempSrc: Oral  PainSc: 0-No pain      Patients Stated Pain Goal: 0 (10/10/17 2030)  Complications: No apparent anesthesia complications

## 2017-10-12 ENCOUNTER — Encounter (HOSPITAL_COMMUNITY): Payer: Self-pay | Admitting: Surgery

## 2017-10-12 ENCOUNTER — Inpatient Hospital Stay (HOSPITAL_COMMUNITY)
Admission: EM | Disposition: A | Discharge status: Skilled Nursing Facility | Payer: Self-pay | Source: Home / Self Care | Attending: Internal Medicine

## 2017-10-12 DIAGNOSIS — I442 Atrioventricular block, complete: Secondary | ICD-10-CM

## 2017-10-12 HISTORY — PX: PACEMAKER IMPLANT: EP1218

## 2017-10-12 LAB — COMPREHENSIVE METABOLIC PANEL
ALBUMIN: 2.8 g/dL — AB (ref 3.5–5.0)
ALK PHOS: 49 U/L (ref 38–126)
ALT: 37 U/L (ref 0–44)
AST: 47 U/L — ABNORMAL HIGH (ref 15–41)
Anion gap: 10 (ref 5–15)
BUN: 19 mg/dL (ref 8–23)
CHLORIDE: 103 mmol/L (ref 98–111)
CO2: 25 mmol/L (ref 22–32)
CREATININE: 1.81 mg/dL — AB (ref 0.61–1.24)
Calcium: 8 mg/dL — ABNORMAL LOW (ref 8.9–10.3)
GFR calc Af Amer: 40 mL/min — ABNORMAL LOW (ref >60)
GFR calc non Af Amer: 35 mL/min — ABNORMAL LOW (ref >60)
GLUCOSE: 195 mg/dL — AB (ref 70–99)
Potassium: 3.5 mmol/L (ref 3.5–5.1)
SODIUM: 138 mmol/L (ref 135–145)
Total Bilirubin: 1.9 mg/dL — ABNORMAL HIGH (ref 0.3–1.2)
Total Protein: 6 g/dL — ABNORMAL LOW (ref 6.5–8.1)

## 2017-10-12 LAB — CBC WITH DIFFERENTIAL/PLATELET
Abs Immature Granulocytes: 0 10*3/uL (ref 0.0–0.1)
Basophils Absolute: 0 10*3/uL (ref 0.0–0.1)
Basophils Relative: 0 %
EOS PCT: 0 %
Eosinophils Absolute: 0 10*3/uL (ref 0.0–0.7)
HEMATOCRIT: 30.1 % — AB (ref 39.0–52.0)
Hemoglobin: 9.4 g/dL — ABNORMAL LOW (ref 13.0–17.0)
Immature Granulocytes: 0 %
LYMPHS ABS: 0.8 10*3/uL (ref 0.7–4.0)
LYMPHS PCT: 11 %
MCH: 28.1 pg (ref 26.0–34.0)
MCHC: 31.2 g/dL (ref 30.0–36.0)
MCV: 89.9 fL (ref 78.0–100.0)
MONO ABS: 1 10*3/uL (ref 0.1–1.0)
Monocytes Relative: 13 %
Neutro Abs: 5.5 10*3/uL (ref 1.7–7.7)
Neutrophils Relative %: 76 %
Platelets: 252 10*3/uL (ref 150–400)
RBC: 3.35 MIL/uL — ABNORMAL LOW (ref 4.22–5.81)
RDW: 14 % (ref 11.5–15.5)
WBC: 7.4 10*3/uL (ref 4.0–10.5)

## 2017-10-12 LAB — GLUCOSE, CAPILLARY
GLUCOSE-CAPILLARY: 191 mg/dL — AB (ref 70–99)
GLUCOSE-CAPILLARY: 198 mg/dL — AB (ref 70–99)
GLUCOSE-CAPILLARY: 208 mg/dL — AB (ref 70–99)
GLUCOSE-CAPILLARY: 219 mg/dL — AB (ref 70–99)
Glucose-Capillary: 187 mg/dL — ABNORMAL HIGH (ref 70–99)
Glucose-Capillary: 224 mg/dL — ABNORMAL HIGH (ref 70–99)

## 2017-10-12 SURGERY — PACEMAKER IMPLANT

## 2017-10-12 MED ORDER — HEPARIN (PORCINE) IN NACL 1000-0.9 UT/500ML-% IV SOLN
INTRAVENOUS | Status: DC | PRN
  Administered 2017-10-12: 500 mL

## 2017-10-12 MED ORDER — LIDOCAINE HCL 1 % IJ SOLN
INTRAMUSCULAR | Status: AC
  Filled 2017-10-12: qty 20

## 2017-10-12 MED ORDER — FUROSEMIDE 10 MG/ML IJ SOLN
INTRAMUSCULAR | Status: AC
  Filled 2017-10-12: qty 4

## 2017-10-12 MED ORDER — FENTANYL CITRATE (PF) 100 MCG/2ML IJ SOLN
INTRAMUSCULAR | Status: DC | PRN
  Administered 2017-10-12: 12.5 ug via INTRAVENOUS

## 2017-10-12 MED ORDER — SODIUM CHLORIDE 0.9 % IV SOLN
INTRAVENOUS | Status: DC
  Administered 2017-10-12 (×2): via INTRAVENOUS

## 2017-10-12 MED ORDER — FUROSEMIDE 10 MG/ML IJ SOLN
INTRAMUSCULAR | Status: DC | PRN
  Administered 2017-10-12: 40 mg via INTRAVENOUS

## 2017-10-12 MED ORDER — MIDAZOLAM HCL 5 MG/5ML IJ SOLN
INTRAMUSCULAR | Status: DC | PRN
  Administered 2017-10-12: 0.5 mg via INTRAVENOUS

## 2017-10-12 MED ORDER — ACETAMINOPHEN 325 MG PO TABS: 325.0000 mg | ORAL_TABLET | ORAL | Status: DC | PRN

## 2017-10-12 MED ORDER — ONDANSETRON HCL 4 MG/2ML IJ SOLN: 4.0000 mg | Freq: Four times a day (QID) | INTRAMUSCULAR | Status: DC | PRN

## 2017-10-12 MED ORDER — METOPROLOL TARTRATE 5 MG/5ML IV SOLN
INTRAVENOUS | Status: AC
  Filled 2017-10-12: qty 5

## 2017-10-12 MED ORDER — SODIUM CHLORIDE 0.9 % IV SOLN
80.0000 mg | INTRAVENOUS | Status: AC
  Administered 2017-10-12: 80 mg
  Filled 2017-10-12: qty 2

## 2017-10-12 MED ORDER — CEFAZOLIN SODIUM-DEXTROSE 2-4 GM/100ML-% IV SOLN
INTRAVENOUS | Status: AC
  Filled 2017-10-12: qty 100

## 2017-10-12 MED ORDER — CHLORHEXIDINE GLUCONATE 4 % EX LIQD
60.0000 mL | Freq: Once | CUTANEOUS | Status: AC
  Administered 2017-10-12: 4 via TOPICAL

## 2017-10-12 MED ORDER — LIDOCAINE HCL (PF) 1 % IJ SOLN
INTRAMUSCULAR | Status: DC | PRN
  Administered 2017-10-12: 45 mL

## 2017-10-12 MED ORDER — MIDAZOLAM HCL 5 MG/5ML IJ SOLN
INTRAMUSCULAR | Status: AC
  Filled 2017-10-12: qty 5

## 2017-10-12 MED ORDER — HEPARIN (PORCINE) IN NACL 1000-0.9 UT/500ML-% IV SOLN
INTRAVENOUS | Status: AC
  Filled 2017-10-12: qty 500

## 2017-10-12 MED ORDER — CEFAZOLIN SODIUM-DEXTROSE 2-4 GM/100ML-% IV SOLN
2.0000 g | INTRAVENOUS | Status: AC
  Administered 2017-10-12: 2 g via INTRAVENOUS
  Filled 2017-10-12: qty 100

## 2017-10-12 MED ORDER — FENTANYL CITRATE (PF) 100 MCG/2ML IJ SOLN
INTRAMUSCULAR | Status: AC
  Filled 2017-10-12: qty 2

## 2017-10-12 MED ORDER — CHLORHEXIDINE GLUCONATE 4 % EX LIQD: 60.0000 mL | Freq: Once | CUTANEOUS | Status: DC

## 2017-10-12 MED ORDER — METOPROLOL TARTRATE 5 MG/5ML IV SOLN
INTRAVENOUS | Status: DC | PRN
  Administered 2017-10-12: 5 mg via INTRAVENOUS

## 2017-10-12 MED ORDER — SODIUM CHLORIDE 0.9 % IV SOLN
INTRAVENOUS | Status: AC
  Filled 2017-10-12: qty 2

## 2017-10-12 SURGICAL SUPPLY — 12 items
CABLE SURGICAL S-101-97-12 (CABLE) ×3 IMPLANT
CATH RIGHTSITE C315HIS02 (CATHETERS) ×3 IMPLANT
GUIDEWIRE ANGLED .035X150CM (WIRE) ×3 IMPLANT
IPG PACE AZUR XT DR MRI W1DR01 (Pacemaker) ×1 IMPLANT
LEAD CAPSURE NOVUS 5076-52CM (Lead) ×3 IMPLANT
LEAD SELECT SECURE 3830 383069 (Lead) ×1 IMPLANT
PACE AZURE XT DR MRI W1DR01 (Pacemaker) ×3 IMPLANT
PAD DEFIB LIFELINK (PAD) ×3 IMPLANT
SELECT SECURE 3830 383069 (Lead) ×3 IMPLANT
SHEATH CLASSIC 7F (SHEATH) ×6 IMPLANT
TRAY PACEMAKER INSERTION (PACKS) ×3 IMPLANT
WIRE HI TORQ VERSACORE-J 145CM (WIRE) ×3 IMPLANT

## 2017-10-12 NOTE — Plan of Care (Signed)

## 2017-10-12 NOTE — Progress Notes (Addendum)
[facility] Progress Note  1 Day Post-Op  Subjective: CC:  Right upper quadrant pain prior to surgery has resolved.  Endorses abdominal soreness that is worse with coughing. Feels bloated.  Denies nausea or vomiting.  Having flatus but denies any bowel movements.   Going for pacemaker placement today.   Tolerated some clears this morning.   Objective: Vital signs in last 24 hours: Temp:  [99 F (37.2 C)-100.5 F (38.1 C)] 100.5 F (38.1 C) (07/11 0427) Pulse Rate:  [37-101] 49 (07/11 0700) Resp:  [7-71] 29 (07/11 0700) BP: (127-185)/(48-80) 182/64 (07/11 0700) SpO2:  [82 %-100 %] 94 % (07/11 0700) Weight:  [109.3 kg (240 lb 15.4 oz)] 109.3 kg (240 lb 15.4 oz) (07/10 1251) Last BM Date: 10/08/17  Intake/Output from previous day: 07/10 0701 - 07/11 0700 In: 3332 [I.V.:2958.5; IV Piggyback:373.6] Out: 50 [Blood:50] Intake/Output this shift: No intake/output data recorded.  PE: Gen:  Alert, NAD, pleasant Pulm:  Normal effort Abd: Soft, protuberant (large abdomen), appropriately tender, bowel sounds present, incisions clean and dry  Skin: warm and dry, no rashes  Psych: A&Ox3   Lab Results:  Recent Labs    10/11/17 0340 10/12/17 0257  WBC 8.1 7.4  HGB 10.6* 9.4*  HCT 33.2* 30.1*  PLT 254 252   BMET Recent Labs    10/11/17 0340 10/12/17 0257  NA 140 138  K 3.0* 3.5  CL 101 103  CO2 27 25  GLUCOSE 118* 195*  BUN 15 19  CREATININE 1.58* 1.81*  CALCIUM 8.8* 8.0*   PT/INR No results for input(s): LABPROT, INR in the last 72 hours. CMP     Component Value Date/Time   NA 138 10/12/2017 0257   K 3.5 10/12/2017 0257   CL 103 10/12/2017 0257   CO2 25 10/12/2017 0257   GLUCOSE 195 (H) 10/12/2017 0257   BUN 19 10/12/2017 0257   CREATININE 1.81 (H) 10/12/2017 0257   CALCIUM 8.0 (L) 10/12/2017 0257   PROT 6.0 (L) 10/12/2017 0257   ALBUMIN 2.8 (L) 10/12/2017 0257   AST 47 (H) 10/12/2017 0257   ALT 37 10/12/2017 0257   ALKPHOS 49 10/12/2017 0257    BILITOT 1.9 (H) 10/12/2017 0257   GFRNONAA 35 (L) 10/12/2017 0257   GFRAA 40 (L) 10/12/2017 0257   Lipase     Component Value Date/Time   LIPASE 36 10/09/2017 0938       Studies/Results: Dg Cholangiogram Operative  Result Date: 10/11/2017 CLINICAL DATA:  Gallstones EXAM: INTRAOPERATIVE CHOLANGIOGRAM TECHNIQUE: Cholangiographic images from the C-arm fluoroscopic device were submitted for interpretation post-operatively. Please see the procedural report for the amount of contrast and the fluoroscopy time utilized. COMPARISON:  None. FINDINGS: Contrast fills the biliary tree and duodenum without filling defects in the common bile duct. IMPRESSION: Patent biliary tree. Electronically Signed   By: Jolaine Click M.D.   On: 10/11/2017 16:56   Nm Hepato W/eject Fract  Result Date: 10/10/2017 CLINICAL DATA:  Chronic RIGHT UPPER quadrant abdominal pain which acutely worsened 3 days ago, associated with nausea and vomiting. EXAM: NUCLEAR MEDICINE HEPATOBILIARY IMAGING TECHNIQUE: Sequential images of the abdomen were obtained out to 90 minutes following intravenous administration of radiopharmaceutical. At 90 minutes, 3.0 mg of morphine were administered intravenously and imaging was performed for an additional 30 minutes. RADIOPHARMACEUTICALS:  5.3 mCi Tc-66m Choletec IV COMPARISON:  No prior nuclear medicine hepatobiliary scan. RIGHT UPPER quadrant abdominal ultrasound yesterday which demonstrated gallbladder sludge but no sonographic evidence of acute cholecystitis. FINDINGS: Hepatic  uptake of radiotracer is normal and there is prompt excretion into the biliary system. Small bowel activity is visible at approximately 10 minutes. The gallbladder was not visible out to 90 minutes. There is no evidence of tracer retention by the liver. The gallbladder was not visible after intravenous administration of morphine. IMPRESSION: Nonvisualization of the gallbladder after intravenous morphine administration  indicating cystic duct obstruction and therefore acute cholecystitis. These results will be called to the ordering clinician or representative by the Radiologist Assistant, and communication documented in the PACS or zVision Dashboard. Electronically Signed   By: Hulan Saashomas  Lawrence M.D.   On: 10/10/2017 12:19    Anti-infectives: Anti-infectives (From admission, onward)   Start     Dose/Rate Route Frequency Ordered Stop   10/11/17 1256  ceFAZolin (ANCEF) 2-4 GM/100ML-% IVPB    Note to Pharmacy:  Sandi RavelingSchonewitz, Leigh   : cabinet override      10/11/17 1256 10/12/17 0059   10/11/17 1200  gentamicin (GARAMYCIN) 80 mg in sodium chloride 0.9 % 500 mL irrigation  Status:  Discontinued     80 mg Irrigation To ShortStay Surgical 10/11/17 0425 10/11/17 0916   10/11/17 0430  ceFAZolin (ANCEF) IVPB 2g/100 mL premix  Status:  Discontinued     2 g 200 mL/hr over 30 Minutes Intravenous To ShortStay Surgical 10/11/17 0425 10/11/17 0916   10/09/17 1800  piperacillin-tazobactam (ZOSYN) IVPB 3.375 g     3.375 g 12.5 mL/hr over 240 Minutes Intravenous Every 8 hours 10/09/17 1650         Assessment/Plan Complete heart block - s/p temporary pacer, planning for pacemaker placement this admission DM II HTN CKD III  Gangrenous cholecystitis S/P laparoscopic cholecystectomy 7/10 Dr. Ezzard StandingNewman -  POD#1, post-op TMAX 100.5  -  Continue IV abx 5-7 days post-op  -  Advance [facility] to [facility] as tolerated -  Incentive spirometry q 1h, mobilize  -  Recommend PT and OT eval's tomorrow.  Patient states they have a lot of stairs in their home.  FEN: Clears then n.p.o. for procedure today.  Okay from a surgical perspective to advance diet heart healthy as tolerated VTE: SCDs, Lovenox ID: Zosyn 7/8 >>  Foley: none    LOS: 3 days    Adam PhenixElizabeth S Simaan , [facility] 10/12/2017, 7:35 AM Pager: [phone] Consults: [phone]  Agree with above. Just got back from permanent pacer placement. Talked to  wife. Okay to advance diet as tolerated.  Ovidio Kinavid Junetta Hearn, MD, [facility] Pager: [phone] Office phone:  [phone]

## 2017-10-12 NOTE — Progress Notes (Addendum)
Electrophysiology Rounding Note  Patient Name: Matthew Sellers Date of Encounter: 10/12/2017  [facility] Elect[facility]ologist: Ladona Ridgelaylor   Subjective   The patient is doing well today.  +bloating. Abdominal pain better.   Inpatient Medications    Scheduled Meds: . amLODipine  10 mg Oral Daily  . aspirin EC  81 mg Oral Daily  . Brinzolamide-Brimonidine  1 drop Ophthalmic BID  . enoxaparin (LOVENOX) injection  40 mg Subcutaneous Q24H  . gentamicin irrigation  80 mg Irrigation On Call  . insulin aspart  0-15 Units Subcutaneous Q4H  . insulin glargine  20 Units Subcutaneous QHS  . latanoprost  1 drop Both Eyes QHS  . metoCLOPramide  10 mg Oral Q6H  .  morphine injection  4 mg Intravenous Once  . ondansetron (ZOFRAN) IV  4 mg Intravenous Once  . pravastatin  20 mg Oral QHS  . sodium chloride flush  3 mL Intravenous Q12H  . sodium chloride flush  3 mL Intravenous Q12H   Continuous Infusions: . sodium chloride 250 mL (10/11/17 1900)  . sodium chloride 50 mL/hr at 10/12/17 0831  .  ceFAZolin (ANCEF) IV    . dextrose 5 % and 0.45 % NaCl with KCl 20 mEq/L 100 mL/hr at 10/12/17 0800  . lactated ringers    . piperacillin-tazobactam (ZOSYN)  IV 12.5 mL/hr at 10/12/17 0800   PRN Meds: sodium chloride, acetaminophen, HYDROcodone-acetaminophen, morphine injection, ondansetron (ZOFRAN) IV, ondansetron **OR** [DISCONTINUED] ondansetron (ZOFRAN) IV, sodium chloride flush   Vital Signs    Vitals:   10/12/17 0600 10/12/17 0700 10/12/17 0749 10/12/17 0800  BP: (!) 179/66 (!) 182/64  (!) 143/66  Pulse: (!) 47 (!) 49  (!) 49  Resp: (!) 24 (!) 29  20  Temp:   (!) 100.4 F (38 C)   TempSrc:   Oral   SpO2: 93% 94%  90%  Weight:      Height:        Intake/Output Summary (Last 24 hours) at 10/12/2017 0836 Last data filed at 10/12/2017 0800 Gross per 24 hour  Intake 3556.99 ml  Output 50 ml  Net 3506.99 ml   Filed Weights   10/09/17 0940 10/09/17 1729  10/11/17 1251  Weight: 240 lb (108.9 kg) 240 lb 15.4 oz (109.3 kg) 240 lb 15.4 oz (109.3 kg)    Physical Exam    GEN- The patient is well appearing, alert and oriented x 3 today.   Head- normocephalic, atraumatic Eyes-  Sclera clear, conjunctiva pink Ears- hearing intact Oropharynx- clear Neck- supple Lungs- Clear to ausculation bilaterally, normal work of breathing Heart- Bradycardic regular rate and rhythm  GI- soft, NT, ND, + BS Extremities- no clubbing, cyanosis, or edema Skin- no rash or lesion Psych- euthymic mood, full affect Neuro- strength and sensation are intact  Labs    CBC Recent Labs    10/11/17 0340 10/12/17 0257  WBC 8.1 7.4  NEUTROABS  --  5.5  HGB 10.6* 9.4*  HCT 33.2* 30.1*  MCV 88.1 89.9  PLT 254 252   Basic Metabolic Panel Recent Labs    16/01/9606/08/19 0938  10/10/17 1128 10/11/17 0340 10/12/17 0257  NA 138   < >  --  140 138  K 3.3*   < >  --  3.0* 3.5  CL 100   < >  --  101 103  CO2 28   < >  --  27 25  GLUCOSE 223*   < >  --  118* 195*  BUN 25*   < >  --  15 19  CREATININE 1.61*   < >  --  1.58* 1.81*  CALCIUM 9.1   < >  --  8.8* 8.0*  MG 1.7  --  2.0  --   --   PHOS 3.2  --   --   --   --    < > = values in this interval not displayed.   Liver Function Tests Recent Labs    10/11/17 0340 10/12/17 0257  AST 19 47*  ALT 14 37  ALKPHOS 47 49  BILITOT 2.9* 1.9*  PROT 6.4* 6.0*  ALBUMIN 3.0* 2.8*   Recent Labs    10/09/17 0938  LIPASE 36   Cardiac Enzymes Recent Labs    10/09/17 1732 10/09/17 2149 10/10/17 0413  TROPONINI 0.03* 0.04* 0.04*   BNP Invalid input(s): POCBNP D-Dimer No results for input(s): DDIMER in the last 72 hours. Hemoglobin A1C Recent Labs    10/10/17 1128  HGBA1C 7.4*   Fasting Lipid Panel No results for input(s): CHOL, HDL, LDLCALC, TRIG, CHOLHDL, LDLDIRECT in the last 72 hours. Thyroid Function Tests Recent Labs    10/09/17 1105  TSH 2.703    Telemetry    High grade heart block  (personally reviewed)  Radiology    Dg Cholangiogram Operative  Result Date: 10/11/2017 CLINICAL DATA:  Gallstones EXAM: INTRAOPERATIVE CHOLANGIOGRAM TECHNIQUE: Cholangiographic images from the C-arm fluoroscopic device were submitted for interpretation post-operatively. Please see the procedural report for the amount of contrast and the fluoroscopy time utilized. COMPARISON:  None. FINDINGS: Contrast fills the biliary tree and duodenum without filling defects in the common bile duct. IMPRESSION: Patent biliary tree. Electronically Signed   By: Jolaine Click M.D.   On: 10/11/2017 16:56   Nm Hepato W/eject Fract  Result Date: 10/10/2017 CLINICAL DATA:  Chronic RIGHT UPPER quadrant abdominal pain which acutely worsened 3 days ago, associated with nausea and vomiting. EXAM: NUCLEAR MEDICINE HEPATOBILIARY IMAGING TECHNIQUE: Sequential images of the abdomen were obtained out to 90 minutes following intravenous administration of radiopharmaceutical. At 90 minutes, 3.0 mg of morphine were administered intravenously and imaging was performed for an additional 30 minutes. RADIOPHARMACEUTICALS:  5.3 mCi Tc-25m Choletec IV COMPARISON:  No prior nuclear medicine hepatobiliary scan. RIGHT UPPER quadrant abdominal ultrasound yesterday which demonstrated gallbladder sludge but no sonographic evidence of acute cholecystitis. FINDINGS: Hepatic uptake of radiotracer is normal and there is prompt excretion into the biliary system. Small bowel activity is visible at approximately 10 minutes. The gallbladder was not visible out to 90 minutes. There is no evidence of tracer retention by the liver. The gallbladder was not visible after intravenous administration of morphine. IMPRESSION: Nonvisualization of the gallbladder after intravenous morphine administration indicating cystic duct obstruction and therefore acute cholecystitis. These results will be called to the ordering clinician or representative by the Radiologist  Assistant, and communication documented in the PACS or zVision Dashboard. Electronically Signed   By: Hulan Saas M.D.   On: 10/10/2017 12:19    Assessment & Plan    1.  High grade heart block Plan for PPM today Risks, benefits reviewed with patient and wife who wish to proceed. Would prefer to proceed today to allow for earlier ambulation  2.  Cholecystitis Doing well post surgery Management per their team  Signed, Gypsy Balsam, NP  10/12/2017, 8:36 AM   EP Attending  Patient seen and examined. Agree with above. The patient is doing well after GB  removal. He is drinking clear liquids, has bowel sounds and pacing some. I have recommended proceeding with PPM insertion. Risks/benefits of waiting another day with temp PM in groin weigh toward proceeding with DDD PM with His bundle pacing today.  Leonia Reeves.D.

## 2017-10-12 NOTE — Progress Notes (Signed)
PROGRESS NOTE  HARBERT Sellers ZOX:[id number] DOB: 05-Apr-1940 DOA: 10/09/2017 PCP: [facility]   LOS: 3 days   Brief Narrative / Interim history: Briefly, patient is a 77 year old male with hypertension, diabetes, hyperlipidemia, CAD, bradycardia presented with upper abdominal pain.  Patient initially presented to the ER on Saturday, 7/7 for chest and abdominal pain with nausea and vomiting.  Patient had negative CTA chest and abdomen and was discharged home.  Patient continued to have abdominal cramps, started having severe periumbilical, mid epigastric and right upper quadrant abdominal pain.  Patient also complained of dizziness on standing up, chest pressure and low heart rate on 7/7.   Assessment & Plan: Principal Problem:   Abdominal pain Active Problems:   Diabetes (HCC)   Hypertension   Hyperlipidemia   Fatty liver   CKD (chronic kidney disease) stage 3, GFR 30-59 ml/min (HCC)   Bradycardia   Complete heart block (HCC)   Abdominal pain with gallbladder sludge and gallstones, Cholecystitis -Ultrasound abdomen showed gallbladder sludge, gallstones without obvious acute cholecystitis -General surgery following, he is s/p cholecystectomy on 7/10. Doing well today, starting to have some clears. No nausea/vomiting  -continue Zosyn -HIDA scan showed nonvisualization of the gallbladder indicating cystic duct obstruction and acute cholecystitis  Bradycardia with intermittent complete heart block -Initial EKG on 7/7 had shown heart block and patient was asked to come back to the ED -History reviewed shows complete heart block -EP cardiology consulted, patient had a temporary pacemaker placed yesterday prior to his surgery. To be converted to a permanent pacer today or tomorrow.    Diabetes (HCC) mellitus type II, uncontrolled with hypoglycemia -Hemoglobin A1c 7.5 -decrease Lantus to 20 units, continue sliding scale insulin moderate every 4 hours, moderate, currently  n.p.o. -Continue D5 half normal with K supplementation  -CBGs well controlled, keep on current regimen. Will covert to ACQHS when eating consistently  Hypokalemia  -Replaced IV, magnesium 2.0  Hypertension -Continue Norvasc, hold lisinopril, HCTZ due to renal insufficiency  Mild acute on CKD (chronic kidney disease) stage 3, GFR 30-59 ml/min (HCC) -Baseline creatinine 1.2-1.3, slight increase to 1.8 post op, continue to monitor.    DVT prophylaxis: Lovenox Code Status: Full code Family Communication: wife at bedside Disposition Plan: home when ready  Consultants:   Cardiology   General surgery   Procedures:   Lap chole 7/10   2D echo Impressions: - Normal ejection fraction without wall motion abnormalities.  Antimicrobials:  Zosyn 7/8 >>  Subjective: - no chest pain, shortness of breath, no abdominal pain, nausea or vomiting.   Objective: Vitals:   10/12/17 0749 10/12/17 0800 10/12/17 0900 10/12/17 0946  BP:  (!) 143/66 (!) 148/58   Pulse:  (!) 49 (!) 47 (!) 48  Resp:  20 20 (!) 30  Temp: (!) 100.4 F (38 C)     TempSrc: Oral     SpO2:  90% (!) 89% 91%  Weight:      Height:        Intake/Output Summary (Last 24 hours) at 10/12/2017 1002 Last data filed at 10/12/2017 0900 Gross per 24 hour  Intake 2289.5 ml  Output 150 ml  Net 2139.5 ml   Filed Weights   10/09/17 0940 10/09/17 1729 10/11/17 1251  Weight: 108.9 kg (240 lb) 109.3 kg (240 lb 15.4 oz) 109.3 kg (240 lb 15.4 oz)    Examination:  Constitutional: NAD, poleasant Eyes: no scleral icterus ENMT: mmm Respiratory: CTA biL no wheezing, no crackles Cardiovascular: RRR, bradycardia. No murmurs.  No edema Abdomen: mild tenderness throughout  Musculoskeletal: no cyanosis Skin: no rashes seen Neurologic: non focal, equal strength    Data Reviewed: I have independently reviewed following labs and imaging studies   CBC: Recent Labs  Lab 10/07/17 2115 10/09/17 0938 10/10/17 0413  10/11/17 0340 10/12/17 0257  WBC 7.7 11.3* 9.6 8.1 7.4  NEUTROABS  --   --   --   --  5.5  HGB 12.0* 11.3* 11.0* 10.6* 9.4*  HCT 38.3* 35.4* 34.9* 33.2* 30.1*  MCV 90.3 89.4 89.7 88.1 89.9  PLT 289 240 249 254 252   Basic Metabolic Panel: Recent Labs  Lab 10/07/17 2115 10/09/17 0938 10/10/17 0413 10/10/17 1128 10/11/17 0340 10/12/17 0257  NA 138 138 138  --  140 138  K 3.4* 3.3* 2.8*  --  3.0* 3.5  CL 102 100 97*  --  101 103  CO2 25 28 28   --  27 25  GLUCOSE 233* 223* 98  --  118* 195*  BUN 22 25* 24*  --  15 19  CREATININE 1.30* 1.61* 1.61*  --  1.58* 1.81*  CALCIUM 9.9 9.1 9.0  --  8.8* 8.0*  MG  --  1.7  --  2.0  --   --   PHOS  --  3.2  --   --   --   --    GFR: Estimated Creatinine Clearance: 43.6 mL/min (A) (by C-G formula based on SCr of 1.81 mg/dL (H)). Liver Function Tests: Recent Labs  Lab 10/08/17 0134 10/09/17 0938 10/11/17 0340 10/12/17 0257  AST 17 20 19  47*  ALT 14 12 14  37  ALKPHOS 37* 38 47 49  BILITOT 1.1 1.9* 2.9* 1.9*  PROT 7.0 6.6 6.4* 6.0*  ALBUMIN 4.0 3.6 3.0* 2.8*   Recent Labs  Lab 10/09/17 0938  LIPASE 36   No results for input(s): AMMONIA in the last 168 hours. Coagulation Profile: No results for input(s): INR, PROTIME in the last 168 hours. Cardiac Enzymes: Recent Labs  Lab 10/09/17 0938 10/09/17 1732 10/09/17 2149 10/10/17 0413  TROPONINI 0.04* 0.03* 0.04* 0.04*   BNP (last 3 results) No results for input(s): PROBNP in the last 8760 hours. HbA1C: Recent Labs    10/09/17 1836 10/10/17 1128  HGBA1C 7.5* 7.4*   CBG: Recent Labs  Lab 10/11/17 1708 10/11/17 1956 10/12/17 0008 10/12/17 0412 10/12/17 0747  GLUCAP 188* 231* 219* 198* 187*   Lipid Profile: No results for input(s): CHOL, HDL, LDLCALC, TRIG, CHOLHDL, LDLDIRECT in the last 72 hours. Thyroid Function Tests: Recent Labs    10/09/17 1105  TSH 2.703   Anemia Panel: No results for input(s): VITAMINB12, FOLATE, FERRITIN, TIBC, IRON, RETICCTPCT in  the last 72 hours. Urine analysis:    Component Value Date/Time   COLORURINE YELLOW 08/30/2016 0304   APPEARANCEUR CLEAR 08/30/2016 0304   LABSPEC 1.024 08/30/2016 0304   PHURINE 5.0 08/30/2016 0304   GLUCOSEU 150 (A) 08/30/2016 0304   HGBUR NEGATIVE 08/30/2016 0304   BILIRUBINUR NEGATIVE 08/30/2016 0304   KETONESUR 5 (A) 08/30/2016 0304   PROTEINUR 30 (A) 08/30/2016 0304   UROBILINOGEN 1.0 06/30/2014 0209   NITRITE NEGATIVE 08/30/2016 0304   LEUKOCYTESUR NEGATIVE 08/30/2016 0304   Sepsis Labs: Invalid input(s): PROCALCITONIN, LACTICIDVEN  Recent Results (from the past 240 hour(s))  MRSA PCR Screening     Status: None   Collection Time: 10/09/17  5:22 PM  Result Value Ref Range Status   MRSA by PCR NEGATIVE NEGATIVE Final  Comment:        The GeneXpert MRSA Assay (FDA approved for NASAL specimens only), is one component of a comprehensive MRSA colonization surveillance program. It is not intended to diagnose MRSA infection nor to guide or monitor treatment for MRSA infections. Performed at [facility], [address], [place], [place] [address]   Culture, blood (x 2)     Status: None (Preliminary result)   Collection Time: 10/09/17  6:36 PM  Result Value Ref Range Status   Specimen Description BLOOD LEFT ANTECUBITAL  Final   Special Requests   Final    BOTTLES DRAWN AEROBIC ONLY Blood Culture adequate volume   Culture   Final    NO GROWTH 2 DAYS Performed at [facility], [address] [place], [place] [address]    Report Status PENDING  Incomplete  Culture, blood (x 2)     Status: None (Preliminary result)   Collection Time: 10/09/17  6:36 PM  Result Value Ref Range Status   Specimen Description BLOOD LEFT HAND  Final   Special Requests   Final    BOTTLES DRAWN AEROBIC ONLY Blood Culture results may not be optimal due to an inadequate volume of blood received in culture bottles   Culture   Final    NO GROWTH 2 DAYS Performed at [facility], [address] [place], [place] [address]    Report Status PENDING  Incomplete  Surgical PCR screen     Status: None   Collection Time: 10/11/17  5:41 AM  Result Value Ref Range Status   MRSA, PCR NEGATIVE NEGATIVE Final   Staphylococcus aureus NEGATIVE NEGATIVE Final    Comment: (NOTE) The Xpert SA Assay (FDA approved for NASAL specimens in patients 66 years of age and older), is one component of a comprehensive surveillance program. It is not intended to diagnose infection nor to guide or monitor treatment. Performed at [facility], [address], [place], [place] [address]       Radiology Studies: Dg Cholangiogram Operative  Result Date: 10/11/2017 CLINICAL DATA:  Gallstones EXAM: INTRAOPERATIVE CHOLANGIOGRAM TECHNIQUE: Cholangiographic images from the C-arm fluoroscopic device were submitted for interpretation post-operatively. Please see the procedural report for the amount of contrast and the fluoroscopy time utilized. COMPARISON:  None. FINDINGS: Contrast fills the biliary tree and duodenum without filling defects in the common bile duct. IMPRESSION: Patent biliary tree. Electronically Signed   By: Jolaine Click M.D.   On: 10/11/2017 16:56   Nm Hepato W/eject Fract  Result Date: 10/10/2017 CLINICAL DATA:  Chronic RIGHT UPPER quadrant abdominal pain which acutely worsened 3 days ago, associated with nausea and vomiting. EXAM: NUCLEAR MEDICINE HEPATOBILIARY IMAGING TECHNIQUE: Sequential images of the abdomen were obtained out to 90 minutes following intravenous administration of radiopharmaceutical. At 90 minutes, 3.0 mg of morphine were administered intravenously and imaging was performed for an additional 30 minutes. RADIOPHARMACEUTICALS:  5.3 mCi Tc-51m Choletec IV COMPARISON:  No prior nuclear medicine hepatobiliary scan. RIGHT UPPER quadrant abdominal ultrasound yesterday which demonstrated gallbladder sludge but no sonographic evidence of acute cholecystitis.  FINDINGS: Hepatic uptake of radiotracer is normal and there is prompt excretion into the biliary system. Small bowel activity is visible at approximately 10 minutes. The gallbladder was not visible out to 90 minutes. There is no evidence of tracer retention by the liver. The gallbladder was not visible after intravenous administration of morphine. IMPRESSION: Nonvisualization of the gallbladder after intravenous morphine administration indicating cystic  duct obstruction and therefore acute cholecystitis. These results will be called to the ordering clinician or representative by the Radiologist Assistant, and communication documented in the PACS or zVision Dashboard. Electronically Signed   By: Hulan Saas M.D.   On: 10/10/2017 12:19     Scheduled Meds: . amLODipine  10 mg Oral Daily  . aspirin EC  81 mg Oral Daily  . Brinzolamide-Brimonidine  1 drop Ophthalmic BID  . enoxaparin (LOVENOX) injection  40 mg Subcutaneous Q24H  . gentamicin irrigation  80 mg Irrigation To Cath  . insulin aspart  0-15 Units Subcutaneous Q4H  . insulin glargine  20 Units Subcutaneous QHS  . latanoprost  1 drop Both Eyes QHS  . metoCLOPramide  10 mg Oral Q6H  .  morphine injection  4 mg Intravenous Once  . ondansetron (ZOFRAN) IV  4 mg Intravenous Once  . pravastatin  20 mg Oral QHS  . sodium chloride flush  3 mL Intravenous Q12H  . sodium chloride flush  3 mL Intravenous Q12H   Continuous Infusions: . sodium chloride 250 mL (10/11/17 1900)  . sodium chloride 50 mL/hr at 10/12/17 0900  .  ceFAZolin (ANCEF) IV    . dextrose 5 % and 0.45 % NaCl with KCl 20 mEq/L 100 mL/hr at 10/12/17 0900  . lactated ringers    . piperacillin-tazobactam (ZOSYN)  IV Stopped (10/12/17 0981)     Pamella Pert, MD, PhD [facility] Pager [phone]  If 7PM-7AM, please contact night-coverage [email or web address] Password [email or web address] 10/12/2017, 10:02 AM

## 2017-10-12 NOTE — Progress Notes (Signed)
[facility] Progress Note  Matthew Sellers 77 y.o. Jan 28, 1941  CC:  Abdominal pain   Subjective:  status post cholecystectomy with normal intraoperative cholangiogram yesterday.abdominal pain improving. Intermittent nausea. Denied any vomiting. Denied any blood in the stool.   Objective: Vital signs in last 24 hours: Vitals:   10/12/17 0946 10/12/17 1000  BP:  (!) 154/58  Pulse: (!) 48 (!) 48  Resp: (!) 30 (!) 28  Temp:    SpO2: 91% (!) 89%    Physical Exam:  General. He is alert and oriented 3. Not connected distress Heart. Bradycardia. Normal rhythm ABD -  Mild distended. Mild discomfort on palpation for bilateral upper quadrants. Bowel sounds present  Lab Results: Recent Labs    10/10/17 1128 10/11/17 0340 10/12/17 0257  NA  --  140 138  K  --  3.0* 3.5  CL  --  101 103  CO2  --  27 25  GLUCOSE  --  118* 195*  BUN  --  15 19  CREATININE  --  1.58* 1.81*  CALCIUM  --  8.8* 8.0*  MG 2.0  --   --    Recent Labs    10/11/17 0340 10/12/17 0257  AST 19 47*  ALT 14 37  ALKPHOS 47 49  BILITOT 2.9* 1.9*  PROT 6.4* 6.0*  ALBUMIN 3.0* 2.8*   Recent Labs    10/11/17 0340 10/12/17 0257  WBC 8.1 7.4  NEUTROABS  --  5.5  HGB 10.6* 9.4*  HCT 33.2* 30.1*  MCV 88.1 89.9  PLT 254 252   No results for input(s): LABPROT, INR in the last 72 hours.    Assessment/Plan: - acute cholecystitis.status post cholecystectomy with normal intraoperative cholangiogram yesterday. - High grade heart block. - plan for pacemaker placement by cardiology prior to discharge. - Fever - workup per primary team. - anemia. No overt bleeding.   Recommendations ---------------------------- - abdominal pain is improving. Hemoglobin is decreasing but he does not have any overt bleeding. CT scan essentially negative for acute changes on admission. - recommend outpatient workup for his anemia with primary care physician. [facility]  - GI will sign off. Call  us back if needed    Kathi DerParag Jakhari Space MD, FACP 10/12/2017, 11:01 AM  Contact #  [phone]

## 2017-10-13 ENCOUNTER — Inpatient Hospital Stay (HOSPITAL_COMMUNITY): Payer: Medicare Other

## 2017-10-13 LAB — GLUCOSE, CAPILLARY
GLUCOSE-CAPILLARY: 154 mg/dL — AB (ref 70–99)
GLUCOSE-CAPILLARY: 180 mg/dL — AB (ref 70–99)
Glucose-Capillary: 138 mg/dL — ABNORMAL HIGH (ref 70–99)
Glucose-Capillary: 148 mg/dL — ABNORMAL HIGH (ref 70–99)
Glucose-Capillary: 196 mg/dL — ABNORMAL HIGH (ref 70–99)
Glucose-Capillary: 155 mg/dL — ABNORMAL HIGH (ref 70–99)

## 2017-10-13 LAB — COMPREHENSIVE METABOLIC PANEL
ALT: 28 U/L (ref 0–44)
ANION GAP: 10 (ref 5–15)
AST: 28 U/L (ref 15–41)
Albumin: 2.7 g/dL — ABNORMAL LOW (ref 3.5–5.0)
Alkaline Phosphatase: 48 U/L (ref 38–126)
BUN: 18 mg/dL (ref 8–23)
CALCIUM: 7.9 mg/dL — AB (ref 8.9–10.3)
CO2: 25 mmol/L (ref 22–32)
Chloride: 105 mmol/L (ref 98–111)
Creatinine, Ser: 1.36 mg/dL — ABNORMAL HIGH (ref 0.61–1.24)
GFR calc non Af Amer: 49 mL/min — ABNORMAL LOW (ref >60)
GFR, EST AFRICAN AMERICAN: 57 mL/min — AB (ref >60)
Glucose, Bld: 151 mg/dL — ABNORMAL HIGH (ref 70–99)
POTASSIUM: 3.2 mmol/L — AB (ref 3.5–5.1)
SODIUM: 140 mmol/L (ref 135–145)
Total Bilirubin: 1.3 mg/dL — ABNORMAL HIGH (ref 0.3–1.2)
Total Protein: 6.1 g/dL — ABNORMAL LOW (ref 6.5–8.1)

## 2017-10-13 LAB — CBC
HCT: 30.1 % — ABNORMAL LOW (ref 39.0–52.0)
HEMOGLOBIN: 9.5 g/dL — AB (ref 13.0–17.0)
MCH: 28 pg (ref 26.0–34.0)
MCHC: 31.6 g/dL (ref 30.0–36.0)
MCV: 88.8 fL (ref 78.0–100.0)
Platelets: 261 10*3/uL (ref 150–400)
RBC: 3.39 MIL/uL — AB (ref 4.22–5.81)
RDW: 14 % (ref 11.5–15.5)
WBC: 6.4 10*3/uL (ref 4.0–10.5)

## 2017-10-13 MED ORDER — POTASSIUM CHLORIDE CRYS ER 20 MEQ PO TBCR
40.0000 meq | EXTENDED_RELEASE_TABLET | Freq: Once | ORAL | Status: AC
  Administered 2017-10-13: 40 meq via ORAL
  Filled 2017-10-13: qty 2

## 2017-10-13 MED ORDER — SODIUM CHLORIDE 0.9 % IV SOLN
INTRAVENOUS | Status: DC
  Administered 2017-10-13 (×2): via INTRAVENOUS

## 2017-10-13 MED FILL — Furosemide Inj 10 MG/ML: INTRAMUSCULAR | Qty: 4 | Status: AC

## 2017-10-13 MED FILL — Lidocaine HCl Local Inj 1%: INTRAMUSCULAR | Qty: 60 | Status: AC

## 2017-10-13 NOTE — Progress Notes (Signed)
Transferred from 2H.Awake ,alert and oriented x 4.Morbid obese,needs more than 2 assist,hardly able to move his extremities when standing up and transferred to bed.No Skin issue as assessed with Fuller Mandrilarla Yap R.N. Placed in telemetry bed comfortably,called and confirmed.

## 2017-10-13 NOTE — Plan of Care (Signed)
  Problem: Education: Goal: Knowledge of General Education information will improve Outcome: Progressing   Problem: Health Behavior/Discharge Planning: Goal: Ability to manage health-related needs will improve Outcome: Progressing   Problem: Clinical Measurements: Goal: Ability to maintain clinical measurements within normal limits will improve Outcome: Progressing Goal: Will remain free from infection Outcome: Progressing Goal: Diagnostic test results will improve Outcome: Progressing Goal: Respiratory complications will improve Outcome: Progressing Goal: Cardiovascular complication will be avoided Outcome: Progressing   Problem: Activity: Goal: Risk for activity intolerance will decrease Outcome: Progressing   Problem: Nutrition: Goal: Adequate nutrition will be maintained Outcome: Progressing   Problem: Coping: Goal: Level of anxiety will decrease Outcome: Progressing   Problem: Elimination: Goal: Will not experience complications related to bowel motility Outcome: Progressing Goal: Will not experience complications related to urinary retention Outcome: Progressing   Problem: Pain Managment: Goal: General experience of comfort will improve Outcome: Progressing   Problem: Safety: Goal: Ability to remain free from injury will improve Outcome: Progressing   Problem: Skin Integrity: Goal: Risk for impaired skin integrity will decrease Outcome: Progressing   Problem: Spiritual Needs Goal: Ability to function at adequate level Outcome: Progressing   Problem: Education: Goal: Knowledge of cardiac device and self-care will improve Outcome: Progressing Goal: Ability to safely manage health related needs after discharge will improve Outcome: Progressing   Problem: Cardiac: Goal: Ability to achieve and maintain adequate cardiopulmonary perfusion will improve Outcome: Progressing

## 2017-10-13 NOTE — Plan of Care (Signed)
Attempted to call report to 5M.  

## 2017-10-13 NOTE — Care Management Note (Signed)
Case Management Note Donn PieriniKristi Anik Wesch RN,BSN Unit [facility] 1-22 Case Manager  [phone]  Patient Details  Name: Josph MachoHoward F Romm MRN: [id number] Date of Birth: January 22, 1941  Subjective/Objective: Pt admitted with Abdominal painwith gallbladder sludge and gallstones, Cholecystitis                  Action/Plan: PTA pt lived at home with wife- per PT eval recommendations for [facility]- CSW has been consulted- spoke with wife and pt at bedside for HH/DME needs for transition home- per discussion wife has decided that she would prefer [facility] and pt is agreeable now. Explained that even after pt finishes at rehab if needed pt can still [facility] and any DME arranged that is needed from facility prior to returning home. Per wife she [facility]- have called Osborne Cascoadia CSW to alert of SNF preference. CSW will follow for SNF transition needs.   Expected Discharge Date:                  Expected Discharge Plan:  Skilled Nursing Facility  In-House Referral:  Clinical Social Work  Discharge planning Services  CM Consult  Post Acute Care Choice:  Home Health, Durable Medical Equipment Choice offered to:  Spouse, Patient  DME Arranged:    DME Agency:     HH Arranged:    HH Agency:     Status of Service:  In process, will continue to follow  If discussed at Long Length of Stay Meetings, dates discussed:    Discharge Disposition: skilled facility   Additional Comments:  Darrold SpanWebster, Britanee Vanblarcom Hall, RN 10/13/2017, 3:26 PM

## 2017-10-13 NOTE — Clinical Social Work Note (Signed)
Clinical Social Work Assessment  Patient Details  Name: Matthew Sellers MRN: [id number] Date of Birth: Mar 07, 1941  Date of referral:  10/13/17               Reason for consult:  Facility Placement                Permission sought to share information with:  Facility Medical sales representativeContact Representative, Family Supports Permission granted to share information::  Yes, Verbal Permission Granted  Name::     Matthew Sellers  Agency::  SNFs  Relationship::  Spouse  Contact Information:  [phone]  Housing/Transportation Living arrangements for the past 2 months:  Single Family Home Source of Information:  Patient, Spouse Patient Interpreter Needed:  None Criminal Activity/Legal Involvement Pertinent to Current Situation/Hospitalization:  No - Comment as needed Significant Relationships:  Spouse Lives with:  Spouse Do you feel safe going back to the place where you live?  No Need for family participation in patient care:  Yes (Comment)  Care giving concerns:  CSW received consult for possible SNF placement at time of discharge. CSW spoke with patient and spouse regarding PT recommendation of SNF placement at time of discharge. Patient reported that patient's spouse is currently unable to care for patient at their home given patient's current physical needs and fall risk. Patient expressed understanding of PT recommendation and is agreeable to SNF placement at time of discharge. CSW to continue to follow and assist with discharge planning needs.   Social Worker assessment / plan:  CSW spoke with patient and spouse concerning [facility] before [facility] home.  Employment status:  Retired Health and safety inspectornsurance information:  Medicare PT Recommendations:  Skilled Nursing Facility Information / Referral to community resources:  Skilled Nursing Facility  Patient/Family's Response to care:  Patient and spouse recognize need for rehab before returning home and [place].  Patient reported preference for a [facility]. [facility] able to accept patient over the weekend. Patient reported that he is not looking forward to rehab, but knows he needs it.  Patient/Family's Understanding of and Emotional Response to Diagnosis, Current Treatment, and Prognosis:  Patient/family is realistic regarding therapy needs and expressed being hopeful for SNF placement. Patient expressed understanding of CSW role and discharge process as well as medical condition. No questions/concerns about plan or treatment.    Emotional Assessment Appearance:  Appears stated age Attitude/Demeanor/Rapport:  Gracious Affect (typically observed):  Accepting, Appropriate Orientation:  Oriented to Self, Oriented to Place, Oriented to Situation, Oriented to  Time Alcohol / Substance use:  Not Applicable Psych involvement (Current and /or in the community):  No (Comment)  Discharge Needs  Concerns to be addressed:  Care Coordination Readmission within the last 30 days:  No Current discharge risk:  None Barriers to Discharge:  Continued Medical Work up   Matthew Sellers, LCSWA 10/13/2017, 4:52 PM

## 2017-10-13 NOTE — Progress Notes (Addendum)
[facility] Progress Note  1 Day Post-Op  Subjective: CC:  C/o L hip pain that radiates down his posterior thigh, but not all the way to his ankle. This was present before admission and he feels it limits his ability to stand straight up. Otherwise he states his abdominal pain and distention are improving. He is having flatus. Denies fever, nausea, vomiting, chest pain, or SOB.   Objective: Vital signs in last 24 hours: Temp:  [97.8 F (36.6 C)-99.9 F (37.7 C)] 99.5 F (37.5 C) (07/12 0741) Pulse Rate:  [0-114] 63 (07/12 1000) Resp:  [0-92] 25 (07/12 1000) BP: (109-190)/(44-88) 134/61 (07/12 1000) SpO2:  [0 %-99 %] 97 % (07/12 1000) Last BM Date: (reglan started)  Intake/Output from previous day: 07/11 0701 - 07/12 0700 In: 1969.6 [P.O.:770; I.V.:1163.7; IV Piggyback:35.8] Out: 3150 [Urine:3150] Intake/Output this shift: Total I/O In: 463.3 [P.O.:300; I.V.:100; IV Piggyback:63.3] Out: -   PE: Gen:  Alert, NAD, pleasant Card: pacemaker left superior chest wall w/ dressing clean and dry, Regular rate and rhythm, no peripheral edema  Pulm:  Normal effort, clear to auscultation bilaterally Abd: Soft, appropriately tender, mild distention, +BS, incisions C/D/I Skin: warm and dry, no rashes  Psych: A&Ox3      Lab Results:  Recent Labs    10/12/17 0257 10/13/17 0212  WBC 7.4 6.4  HGB 9.4* 9.5*  HCT 30.1* 30.1*  PLT 252 261   BMET Recent Labs    10/12/17 0257 10/13/17 0212  NA 138 140  K 3.5 3.2*  CL 103 105  CO2 25 25  GLUCOSE 195* 151*  BUN 19 18  CREATININE 1.81* 1.36*  CALCIUM 8.0* 7.9*   CMP     Component Value Date/Time   NA 140 10/13/2017 0212   K 3.2 (L) 10/13/2017 0212   CL 105 10/13/2017 0212   CO2 25 10/13/2017 0212   GLUCOSE 151 (H) 10/13/2017 0212   BUN 18 10/13/2017 0212   CREATININE 1.36 (H) 10/13/2017 0212   CALCIUM 7.9 (L) 10/13/2017 0212   PROT 6.1 (L) 10/13/2017 0212   ALBUMIN 2.7 (L) 10/13/2017 0212   AST 28  10/13/2017 0212   ALT 28 10/13/2017 0212   ALKPHOS 48 10/13/2017 0212   BILITOT 1.3 (H) 10/13/2017 0212   GFRNONAA 49 (L) 10/13/2017 0212   GFRAA 57 (L) 10/13/2017 0212   Lipase     Component Value Date/Time   LIPASE 36 10/09/2017 0938   Studies/Results: Dg Chest 2 View  Result Date: 10/13/2017 CLINICAL DATA:  Pacemaker placement. EXAM: CHEST - 2 VIEW COMPARISON:  Radiograph of October 07, 2017. FINDINGS: Stable cardiomegaly. Interval placement of left-sided pacemaker with leads in grossly good position. No pneumothorax is noted. Mild bilateral pleural effusions are noted. Bony thorax is unremarkable. Mild bibasilar subsegmental atelectasis is present. IMPRESSION: Mild bibasilar subsegmental atelectasis with mild pleural effusions. Interval placement of left-sided pacemaker with leads in grossly good position. No pneumothorax is noted. Electronically Signed   By: Lupita RaiderJames  Green Jr, M.D.   On: 10/13/2017 08:45   Dg Cholangiogram Operative  Result Date: 10/11/2017 CLINICAL DATA:  Gallstones EXAM: INTRAOPERATIVE CHOLANGIOGRAM TECHNIQUE: Cholangiographic images from the C-arm fluoroscopic device were submitted for interpretation post-operatively. Please see the procedural report for the amount of contrast and the fluoroscopy time utilized. COMPARISON:  None. FINDINGS: Contrast fills the biliary tree and duodenum without filling defects in the common bile duct. IMPRESSION: Patent biliary tree. Electronically Signed   By: Jolaine ClickArthur  Hoss M.D.   On: 10/11/2017  16:56   Anti-infectives: Anti-infectives (From admission, onward)   Start     Dose/Rate Route Frequency Ordered Stop   10/12/17 0900  gentamicin (GARAMYCIN) 80 mg in sodium chloride 0.9 % 500 mL irrigation     80 mg Irrigation To Cath Lab 10/12/17 0817 10/12/17 1415   10/12/17 0900  ceFAZolin (ANCEF) IVPB 2g/100 mL premix     2 g 200 mL/hr over 30 Minutes Intravenous To Cath Lab 10/12/17 0817 10/12/17 1350   10/11/17 1256  ceFAZolin (ANCEF) 2-4  GM/100ML-% IVPB    Note to Pharmacy:  Sandi Raveling   : cabinet override      10/11/17 1256 10/12/17 0059   10/11/17 1200  gentamicin (GARAMYCIN) 80 mg in sodium chloride 0.9 % 500 mL irrigation  Status:  Discontinued     80 mg Irrigation To ShortStay Surgical 10/11/17 0425 10/11/17 0916   10/11/17 0430  ceFAZolin (ANCEF) IVPB 2g/100 mL premix  Status:  Discontinued     2 g 200 mL/hr over 30 Minutes Intravenous To ShortStay Surgical 10/11/17 0425 10/11/17 0916   10/09/17 1800  piperacillin-tazobactam (ZOSYN) IVPB 3.375 g     3.375 g 12.5 mL/hr over 240 Minutes Intravenous Every 8 hours 10/09/17 1650       Assessment/Plan Complete heart block - s/p PPM 7/11 DM II HTN CKD III  Gangrenous cholecystitis S/P laparoscopic cholecystectomy 7/10 Dr. Ezzard Standing  -  POD#2, afebrile,VSS   -  Continue abx 5-7 days post-op - continue IV abx while in hospital and switch to PO at discharge if necessary.  -  tolerating clears, advancing to carb mod today.    -  Incentive spirometry q 1h, mobilize   -  Recommend PT and OT eval's prior to patient discharge   Left hip pain aggravated by bedrest and hospitalization.  Seen by PT today.  FEN: carb mod  VTE: SCDs, Lovenox ID: Zosyn 7/8 >>  Foley: none     LOS: 4 days    Adam Phenix , [facility] 10/13/2017, 10:11 AM Pager: [phone] Consults: [phone]  Agree with above. Wife and daughter are at bedside. Taking liquids.  Okay to advance diet. Will need 5 - 7 days of antibiotics from the day of surgery.  Ovidio Kin, MD, [facility] Pager: [phone] Office phone:  [phone]

## 2017-10-13 NOTE — Plan of Care (Signed)
Report called to [employer]ley-[employer]lbert RN 29M

## 2017-10-13 NOTE — NC FL2 (Signed)
[place] MEDICAID FL2 LEVEL OF CARE SCREENING TOOL     IDENTIFICATION  Patient Name: Matthew Sellers Birthdate: May 12, 1940 Sex: male Admission Date (Current Location): 10/09/2017  [facility] and [place] Number:  Producer, television/film/video and Address:  [facility] [place] [facility], [address] [place], [place] [address]      Provider Number: [id number]  Attending Physician Name and Address:  Leatha Gilding, MD  Relative Name and Phone Number:  Charissa Bash, [phone]    Current Level of Care: Hospital Recommended Level of Care: Skilled Nursing Facility Prior Approval Number:    Date Approved/Denied:   PASRR Number: [id number]  Discharge Plan: SNF    Current Diagnoses: Patient Active Problem List   Diagnosis Date Noted  . Complete heart block (HCC)   . Abdominal pain 10/09/2017  . Fatty liver 10/09/2017  . CKD (chronic kidney disease) stage 3, GFR 30-59 ml/min (HCC) 10/09/2017  . Bradycardia 10/09/2017  . CAP (community acquired pneumonia) 06/29/2014  . Diabetes (HCC) 06/29/2014  . Hypertension 06/29/2014  . Hyperlipidemia 06/29/2014  . Hypokalemia 06/29/2014  . Anemia 06/29/2014  . Pneumonia 06/29/2014    Orientation RESPIRATION BLADDER Height & Weight     Self, Time, Situation, Place  Normal Continent, External catheter Weight: 109.3 kg (240 lb 15.4 oz) Height:  5\' 10"  (177.8 cm)  BEHAVIORAL SYMPTOMS/MOOD NEUROLOGICAL BOWEL NUTRITION STATUS      Continent Diet(Please see DC Summary)  AMBULATORY STATUS COMMUNICATION OF NEEDS Skin   Extensive Assist Verbally Surgical wounds(Closed incision on abdomen for pacemaker site)                       Personal Care Assistance Level of Assistance  Bathing, Feeding, Dressing Bathing Assistance: Maximum assistance Feeding assistance: Limited assistance Dressing Assistance: Limited assistance     Functional Limitations Info  Sight, Hearing, Speech Sight Info: Impaired Hearing Info:  Impaired Speech Info: Adequate    SPECIAL CARE FACTORS FREQUENCY  PT (By licensed PT), OT (By licensed OT)     PT Frequency: 5x/week OT Frequency: 3x/week            Contractures      Additional Factors Info  Code Status, Allergies, Insulin Sliding Scale Code Status Info: Full Allergies Info: NKA   Insulin Sliding Scale Info: Every 4 hours       Current Medications (10/13/2017):  This is the current hospital active medication list Current Facility-Administered Medications  Medication Dose Route Frequency Provider Last Rate Last Dose  . 0.9 %  sodium chloride infusion  250 mL Intravenous PRN Marinus Maw, MD   Stopped at 10/12/17 1754  . 0.9 %  sodium chloride infusion   Intravenous Continuous Leatha Gilding, MD   Stopped at 10/13/17 1308  . acetaminophen (TYLENOL) tablet 325-650 mg  325-650 mg Oral Q4H PRN Marinus Maw, MD      . amLODipine (NORVASC) tablet 10 mg  10 mg Oral Daily Marinus Maw, MD   10 mg at 10/13/17 0916  . aspirin EC tablet 81 mg  81 mg Oral Daily Marinus Maw, MD   81 mg at 10/13/17 0916  . Brinzolamide-Brimonidine 1-0.2 % SUSP 1 drop  1 drop Ophthalmic BID Leatha Gilding, MD   1 drop at 10/13/17 0916  . enoxaparin (LOVENOX) injection 40 mg  40 mg Subcutaneous Q24H Marinus Maw, MD   40 mg at 10/12/17 1646  . HYDROcodone-acetaminophen (NORCO/VICODIN) 5-325 MG per tablet  1-2 tablet  1-2 tablet Oral Q4H PRN Marinus Mawaylor, Gregg W, MD   1 tablet at 10/13/17 1334  . insulin aspart (novoLOG) injection 0-15 Units  0-15 Units Subcutaneous Q4H Marinus Mawaylor, Gregg W, MD   2 Units at 10/13/17 1214  . insulin glargine (LANTUS) injection 20 Units  20 Units Subcutaneous QHS Marinus Mawaylor, Gregg W, MD   20 Units at 10/12/17 2201  . lactated ringers infusion   Intravenous Continuous Marinus Mawaylor, Gregg W, MD      . latanoprost (XALATAN) 0.005 % ophthalmic solution 1 drop  1 drop Both Eyes QHS Marinus Mawaylor, Gregg W, MD   1 drop at 10/12/17 2212  . metoCLOPramide (REGLAN) tablet 10 mg   10 mg Oral Q6H Marinus Mawaylor, Gregg W, MD   10 mg at 10/13/17 1058  . morphine 4 MG/ML injection 2 mg  2 mg Intravenous Q2H PRN Marinus Mawaylor, Gregg W, MD      . morphine 4 MG/ML injection 4 mg  4 mg Intravenous Once Marinus Mawaylor, Gregg W, MD      . ondansetron [facility](ZOFRAN) injection 4 mg  4 mg Intravenous Once Marinus Mawaylor, Gregg W, MD      . ondansetron [facility](ZOFRAN) injection 4 mg  4 mg Intravenous Q6H PRN Marinus Mawaylor, Gregg W, MD      . ondansetron [facility](ZOFRAN) tablet 4 mg  4 mg Oral Q6H PRN Marinus Mawaylor, Gregg W, MD      . piperacillin-tazobactam (ZOSYN) IVPB 3.375 g  3.375 g Intravenous Q8H Marinus Mawaylor, Gregg W, MD 12.5 mL/hr at 10/13/17 1500    . pravastatin (PRAVACHOL) tablet 20 mg  20 mg Oral QHS Marinus Mawaylor, Gregg W, MD   20 mg at 10/12/17 2201  . sodium chloride flush (NS) 0.9 % injection 3 mL  3 mL Intravenous Q12H Marinus Mawaylor, Gregg W, MD   3 mL at 10/12/17 2223  . sodium chloride flush (NS) 0.9 % injection 3 mL  3 mL Intravenous Q12H Marinus Mawaylor, Gregg W, MD   3 mL at 10/13/17 0916  . sodium chloride flush (NS) 0.9 % injection 3 mL  3 mL Intravenous PRN Marinus Mawaylor, Gregg W, MD         Discharge Medications: Please see discharge summary for a list of discharge medications.  Relevant Imaging Results:  Relevant Lab Results:   Additional Information SSN: [id number]   Kahlie Deutscher S Grand[place], [place]

## 2017-10-13 NOTE — Plan of Care (Signed)
1800 transferred to [id number] via monitor and WC, placed in bed, SCD's on, RN to receive in room, wife with patient

## 2017-10-13 NOTE — Plan of Care (Signed)
  Problem: Education: Goal: Knowledge of General Education information will improve Outcome: Progressing   Problem: Health Behavior/Discharge Planning: Goal: Ability to manage health-related needs will improve Outcome: Progressing   Problem: Clinical Measurements: Goal: Ability to maintain clinical measurements within normal limits will improve Outcome: Progressing Goal: Will remain free from infection Outcome: Progressing Goal: Diagnostic test results will improve Outcome: Progressing Goal: Respiratory complications will improve Outcome: Progressing Goal: Cardiovascular complication will be avoided Outcome: Progressing   Problem: Elimination: Goal: Will not experience complications related to bowel motility Outcome: Progressing   Problem: Safety: Goal: Ability to remain free from injury will improve Outcome: Progressing

## 2017-10-13 NOTE — Evaluation (Signed)
Physical Therapy Evaluation Patient Details Name: Josph MachoHoward F Bellavance MRN: [id number] DOB: [birth year] Today's Date: 10/13/2017   History of Present Illness  77 yo admitted 7/8 with abdominal pain and bradycardia s/p lap cholecystectomy 7/10 and PPM 7/11. PMhx: HTN; DM; HLD; CAD; and bradycardia   Clinical Impression  Pt pleasant sitting in recliner on arrival with family present. Pt reports left hip pain limiting mobility, posture and function currently. Pt requires mod assist to stand from recliner with limited gait of 80' with min assist and chair follow. Pt has 5 steps to enter home and flight to bedroom with wife unable to physically lift pt. Pt with decreased strength, function, posture and gait who will benefit from acute therapy to maximize mobility, gait and function to decrease burden of care. Family considering hiring additional assist at home for transfers and having pt return home in an ambulance if he remains at current functional level if this is chosen path would maximize home equipment and care.       Follow Up Recommendations SNF;Supervision/Assistance - 24 hour    Equipment Recommendations  Rolling walker with 5" wheels;3in1 (PT);Hospital bed    Recommendations for Other Services OT consult     Precautions / Restrictions Precautions Precautions: Fall;ICD/Pacemaker      Mobility  Bed Mobility               General bed mobility comments: in chair on arrival  Transfers Overall transfer level: Needs assistance   Transfers: Sit to/from Stand Sit to Stand: Mod assist         General transfer comment: mod assist to stand from chair with right knee blocked, RUE on arm rest and cues for LUE precautions, increased time to complete. Mod assist to scoot back in chair on return to sitting  Ambulation/Gait Ambulation/Gait assistance: Min assist;+2 safety/equipment Gait Distance (Feet): 80 Feet Assistive device: Rolling walker (2 wheeled) Gait Pattern/deviations:  Step-through pattern;Decreased stride length;Trunk flexed   Gait velocity interpretation: <1.8 ft/sec, indicate of risk for recurrent falls General Gait Details: pt with kyphotic posture with inability to extend trunk due to pain, with chair follow, cues for posture and assist to maintain balance and position in RW  Stairs            Wheelchair Mobility    Modified Rankin (Stroke Patients Only)       Balance Overall balance assessment: Needs assistance   Sitting balance-Leahy Scale: Fair     Standing balance support: Bilateral upper extremity supported Standing balance-Leahy Scale: Poor                               Pertinent Vitals/Pain Pain Assessment: 0-10 Pain Score: 6  Pain Location: left hip Pain Descriptors / Indicators: Aching;Cramping Pain Intervention(s): Limited activity within patient's tolerance;Repositioned;Monitored during session;Patient requesting pain meds-RN notified    Home Living Family/patient expects to be discharged to:: Private residence Living Arrangements: Spouse/significant other Available Help at Discharge: Family;Available 24 hours/day Type of Home: House Home Access: Stairs to enter   Enter[employer] CorporationEntrance Stairs-Number of Steps: 5 Home Layout: Two level;Bed/bath upstairs Home Equipment: Crutches      Prior Function Level of Independence: Independent         Comments: assist at times for having things brought to him if his back is hurting but walks without assist and performs IADLs     Hand Dominance        Extremity/Trunk Assessment  Upper Extremity Assessment Upper Extremity Assessment: Generalized weakness    Lower Extremity Assessment Lower Extremity Assessment: Generalized weakness    Cervical / Trunk Assessment Cervical / Trunk Assessment: Kyphotic  Communication   Communication: No difficulties  Cognition Arousal/Alertness: Awake/alert Behavior During Therapy: WFL for tasks assessed/performed Overall  Cognitive Status: Within Functional Limits for tasks assessed                                        General Comments      Exercises     Assessment/Plan    PT Assessment Patient needs continued PT services  PT Problem List Decreased strength;Decreased mobility;Decreased safety awareness;Decreased activity tolerance;Decreased balance;Decreased knowledge of use of DME;Pain;Decreased knowledge of precautions       PT Treatment Interventions Gait training;Therapeutic exercise;Patient/family education;Stair training;Balance training;Functional mobility training;DME instruction;Therapeutic activities    PT Goals (Current goals can be found in the Care Plan section)  Acute Rehab PT Goals Patient Stated Goal: return home PT Goal Formulation: With patient/family Time For Goal Achievement: 10/27/17 Potential to Achieve Goals: Fair    Frequency Min 3X/week   Barriers to discharge Decreased caregiver support wife with impaired back and unable to physically assist    Co-evaluation               AM-PAC PT "6 Clicks" Daily Activity  Outcome Measure Difficulty turning over in bed (including adjusting bedclothes, sheets and blankets)?: Unable Difficulty moving from lying on back to sitting on the side of the bed? : Unable Difficulty sitting down on and standing up from a chair with arms (e.g., wheelchair, bedside commode, etc,.)?: Unable Help needed moving to and from a bed to chair (including a wheelchair)?: A Lot Help needed walking in hospital room?: A Lot Help needed climbing 3-5 steps with a railing? : Total 6 Click Score: 8    End of Session Equipment Utilized During Treatment: Gait belt Activity Tolerance: Patient tolerated treatment well Patient left: in chair;with call bell/phone within reach;with family/visitor present;with nursing/sitter in room Nurse Communication: Mobility status PT Visit Diagnosis: Unsteadiness on feet (R26.81);Muscle weakness  (generalized) (M62.81);Other abnormalities of gait and mobility (R26.89)    Time: 1123-1150 PT Time Calculation (min) (ACUTE ONLY): 27 min   Charges:   PT Evaluation $PT Eval Moderate Complexity: 1 Mod PT Treatments $Gait Training: 8-22 mins   PT G Codes:        Delaney Meigs, PT [phone]   Jerryl Holzhauer B [facility] 10/13/2017, 12:44 PM

## 2017-10-13 NOTE — Progress Notes (Addendum)
Electrophysiology Rounding Note  Patient Name: Matthew Sellers Date of Encounter: 10/13/2017  Primary Cardiologist: [facility] Electrophysiologist: Ladona Ridgel   Subjective   The patient is doing well today.  At this time, the patient denies chest pain, shortness of breath, or any new concerns.  Inpatient Medications    Scheduled Meds: . amLODipine  10 mg Oral Daily  . aspirin EC  81 mg Oral Daily  . Brinzolamide-Brimonidine  1 drop Ophthalmic BID  . enoxaparin (LOVENOX) injection  40 mg Subcutaneous Q24H  . insulin aspart  0-15 Units Subcutaneous Q4H  . insulin glargine  20 Units Subcutaneous QHS  . latanoprost  1 drop Both Eyes QHS  . metoCLOPramide  10 mg Oral Q6H  .  morphine injection  4 mg Intravenous Once  . ondansetron (ZOFRAN) IV  4 mg Intravenous Once  . pravastatin  20 mg Oral QHS  . sodium chloride flush  3 mL Intravenous Q12H  . sodium chloride flush  3 mL Intravenous Q12H   Continuous Infusions: . sodium chloride Stopped (10/12/17 1754)  . dextrose 5 % and 0.45 % NaCl with KCl 20 mEq/L 50 mL/hr at 10/13/17 0300  . lactated ringers    . piperacillin-tazobactam (ZOSYN)  IV 3.375 g (10/13/17 0656)   PRN Meds: sodium chloride, acetaminophen, HYDROcodone-acetaminophen, morphine injection, ondansetron (ZOFRAN) IV, ondansetron **OR** [DISCONTINUED] ondansetron (ZOFRAN) IV, sodium chloride flush   Vital Signs    Vitals:   10/13/17 0500 10/13/17 0600 10/13/17 0700 10/13/17 0741  BP: (!) 137/45 109/82 128/72   Pulse: 63 73 69   Resp: (!) 25 (!) 22 (!) 24   Temp:    99.5 F (37.5 C)  TempSrc:    Oral  SpO2: 97% 94% 97%   Weight:      Height:        Intake/Output Summary (Last 24 hours) at 10/13/2017 0802 Last data filed at 10/13/2017 0300 Gross per 24 hour  Intake 1744.59 ml  Output 3150 ml  Net -1405.41 ml   Filed Weights   10/09/17 0940 10/09/17 1729 10/11/17 1251  Weight: 240 lb (108.9 kg) 240 lb 15.4 oz (109.3 kg) 240 lb 15.4 oz (109.3 kg)     Physical Exam    GEN- The patient is well appearing, alert and oriented x 3 today.   Head- normocephalic, atraumatic Eyes-  Sclera clear, conjunctiva pink Ears- hearing intact Oropharynx- clear Neck- supple Lungs- Clear to ausculation bilaterally, normal work of breathing Heart- Regular rate and rhythm (paced) GI- soft, NT, ND, + BS Extremities- no clubbing, cyanosis, or edema Skin- no rash or lesion Psych- euthymic mood, full affect Neuro- strength and sensation are intact  Labs    CBC Recent Labs    10/12/17 0257 10/13/17 0212  WBC 7.4 6.4  NEUTROABS 5.5  --   HGB 9.4* 9.5*  HCT 30.1* 30.1*  MCV 89.9 88.8  PLT 252 261   Basic Metabolic Panel Recent Labs    16/10/96 1128  10/12/17 0257 10/13/17 0212  NA  --    < > 138 140  K  --    < > 3.5 3.2*  CL  --    < > 103 105  CO2  --    < > 25 25  GLUCOSE  --    < > 195* 151*  BUN  --    < > 19 18  CREATININE  --    < > 1.81* 1.36*  CALCIUM  --    < >  8.0* 7.9*  MG 2.0  --   --   --    < > = values in this interval not displayed.   Liver Function Tests Recent Labs    10/12/17 0257 10/13/17 0212  AST 47* 28  ALT 37 28  ALKPHOS 49 48  BILITOT 1.9* 1.3*  PROT 6.0* 6.1*  ALBUMIN 2.8* 2.7*   Hemoglobin A1C Recent Labs    10/10/17 1128  HGBA1C 7.4*     Telemetry    Sinus rhythm with V pacing (personally reviewed)  Radiology    Dg Cholangiogram Operative  Result Date: 10/11/2017 CLINICAL DATA:  Gallstones EXAM: INTRAOPERATIVE CHOLANGIOGRAM TECHNIQUE: Cholangiographic images from the C-arm fluoroscopic device were submitted for interpretation post-operatively. Please see the procedural report for the amount of contrast and the fluoroscopy time utilized. COMPARISON:  None. FINDINGS: Contrast fills the biliary tree and duodenum without filling defects in the common bile duct. IMPRESSION: Patent biliary tree. Electronically Signed   By: Jolaine ClickArthur  Hoss M.D.   On: 10/11/2017 16:56     Assessment & Plan     1.  Complete heart block Doing well s/p PPM  Normal device function Wound without complication CXR with leads in stable position, no obvious ptx Routine wound care and follow up (reviewed with patient and entered in AVS)  2.  S/p cholecystectomy Per surgery  Electrophysiology team to see as needed while here. Please call with questions.  Signed, Gypsy BalsamAmber Seiler, NP  10/13/2017, 8:02 AM   EP Attending  Patient seen and examined. His PPM has been interogated under my direction. His device is working normally. Usual followup. CXR demonstrates no PTX.   Leonia ReevesGregg Kennith Morss,M.D.

## 2017-10-13 NOTE — Progress Notes (Signed)
PROGRESS NOTE  Matthew Sellers ZOX:[id number] DOB: July 24, 1940 DOA: 10/09/2017 PCP: [facility]   LOS: 4 days   Brief Narrative / Interim history: Briefly, patient is a 77 year old male with hypertension, diabetes, hyperlipidemia, CAD, bradycardia presented with upper abdominal pain.  Patient initially presented to the ER on Saturday, 7/7 for chest and abdominal pain with nausea and vomiting.  Patient had negative CTA chest and abdomen and was discharged home.  Patient continued to have abdominal cramps, started having severe periumbilical, mid epigastric and right upper quadrant abdominal pain.  Patient also complained of dizziness on standing up, chest pressure and low heart rate on 7/7.   Assessment & Plan: Principal Problem:   Abdominal pain Active Problems:   Diabetes (HCC)   Hypertension   Hyperlipidemia   Fatty liver   CKD (chronic kidney disease) stage 3, GFR 30-59 ml/min (HCC)   Bradycardia   Complete heart block (HCC)   Abdominal pain with gallbladder sludge and gallstones, Cholecystitis -Ultrasound abdomen showed gallbladder sludge, gallstones without obvious acute cholecystitis. HIDA scan showed nonvisualization of the gallbladder indicating cystic duct obstruction and acute cholecystitis -General surgery following, he is s/p cholecystectomy on 7/10.  He is doing well, advance diet -continue Zosyn, will keep on Zosyn while hospitalized and discharged on Augmentin for a total of 7 days postoperative  Bradycardia with intermittent complete heart block -Initial EKG on 7/7 had shown heart block and patient was asked to come back to the ED -History reviewed shows complete heart block -EP cardiology consulted, patient had a temporary pacemaker placed prior to surgery and this was converted to a permanent pacemaker which was placed on 7/11  Diabetes (HCC) mellitus type II, uncontrolled with hypoglycemia -Hemoglobin A1c 7.5 -CBGs controlled, continue to monitor as we are  advancing diet.  Keep on same regimen for now.  Hypokalemia  -Replace potassium again today  Hypertension -Continue Norvasc, hold lisinopril, HCTZ due to renal insufficiency  Mild acute on CKD (chronic kidney disease) stage 3, GFR 30-59 ml/min (HCC) -Baseline creatinine 1.2-1.3, slight increase to 1.8 post op but coming back down to 1.3 this morning    DVT prophylaxis: Lovenox Code Status: Full code Family Communication: wife at bedside Disposition Plan: Home versus SNF, PT to evaluate  Consultants:   Cardiology   General surgery   Procedures:   Lap chole 7/10   2D echo Impressions: - Normal ejection fraction without wall motion abnormalities.  Antimicrobials:  Zosyn 7/8 >>  Subjective: -Feels well, denies any chest pain, denies any abdominal pain, nausea or vomiting  Objective: Vitals:   10/13/17 1156 10/13/17 1200 10/13/17 1211 10/13/17 1300  BP:  (!) 143/78  (!) 161/80  Pulse: 68 69  80  Resp:  (!) 22  (!) 21  Temp:   99.3 F (37.4 C)   TempSrc:   Oral   SpO2:  97%  93%  Weight:      Height:        Intake/Output Summary (Last 24 hours) at 10/13/2017 1354 Last data filed at 10/13/2017 1308 Gross per 24 hour  Intake 1713.85 ml  Output 2650 ml  Net -936.15 ml   Filed Weights   10/09/17 0940 10/09/17 1729 10/11/17 1251  Weight: 108.9 kg (240 lb) 109.3 kg (240 lb 15.4 oz) 109.3 kg (240 lb 15.4 oz)    Examination:  Constitutional: No acute distress Eyes: No scleral icterus, lids and conjunctivae normal ENMT: Moist mucous membranes Respiratory: Clear to auscultation bilaterally without wheezing or crackles Cardiovascular: Regular  rate and rhythm without murmurs, no peripheral edema Abdomen: Soft, mild tenderness, no guarding, no rebound Skin: No rashes seen Neurologic: Nonfocal, equal strength   Data Reviewed: I have independently reviewed following labs and imaging studies   CBC: Recent Labs  Lab 10/09/17 0938 10/10/17 0413  10/11/17 0340 10/12/17 0257 10/13/17 0212  WBC 11.3* 9.6 8.1 7.4 6.4  NEUTROABS  --   --   --  5.5  --   HGB 11.3* 11.0* 10.6* 9.4* 9.5*  HCT 35.4* 34.9* 33.2* 30.1* 30.1*  MCV 89.4 89.7 88.1 89.9 88.8  PLT 240 249 254 252 261   Basic Metabolic Panel: Recent Labs  Lab 10/09/17 0938 10/10/17 0413 10/10/17 1128 10/11/17 0340 10/12/17 0257 10/13/17 0212  NA 138 138  --  140 138 140  K 3.3* 2.8*  --  3.0* 3.5 3.2*  CL 100 97*  --  101 103 105  CO2 28 28  --  27 25 25   GLUCOSE 223* 98  --  118* 195* 151*  BUN 25* 24*  --  15 19 18   CREATININE 1.61* 1.61*  --  1.58* 1.81* 1.36*  CALCIUM 9.1 9.0  --  8.8* 8.0* 7.9*  MG 1.7  --  2.0  --   --   --   PHOS 3.2  --   --   --   --   --    GFR: Estimated Creatinine Clearance: 58.1 mL/min (A) (by C-G formula based on SCr of 1.36 mg/dL (H)). Liver Function Tests: Recent Labs  Lab 10/08/17 0134 10/09/17 8119 10/11/17 0340 10/12/17 0257 10/13/17 0212  AST 17 20 19  47* 28  ALT 14 12 14  37 28  ALKPHOS 37* 38 47 49 48  BILITOT 1.1 1.9* 2.9* 1.9* 1.3*  PROT 7.0 6.6 6.4* 6.0* 6.1*  ALBUMIN 4.0 3.6 3.0* 2.8* 2.7*   Recent Labs  Lab 10/09/17 0938  LIPASE 36   No results for input(s): AMMONIA in the last 168 hours. Coagulation Profile: No results for input(s): INR, PROTIME in the last 168 hours. Cardiac Enzymes: Recent Labs  Lab 10/09/17 0938 10/09/17 1732 10/09/17 2149 10/10/17 0413  TROPONINI 0.04* 0.03* 0.04* 0.04*   BNP (last 3 results) No results for input(s): PROBNP in the last 8760 hours. HbA1C: No results for input(s): HGBA1C in the last 72 hours. CBG: Recent Labs  Lab 10/12/17 2031 10/12/17 2357 10/13/17 0409 10/13/17 0744 10/13/17 1210  GLUCAP 208* 180* 138* 154* 148*   Lipid Profile: No results for input(s): CHOL, HDL, LDLCALC, TRIG, CHOLHDL, LDLDIRECT in the last 72 hours. Thyroid Function Tests: No results for input(s): TSH, T4TOTAL, FREET4, T3FREE, THYROIDAB in the last 72 hours. Anemia  Panel: No results for input(s): VITAMINB12, FOLATE, FERRITIN, TIBC, IRON, RETICCTPCT in the last 72 hours. Urine analysis:    Component Value Date/Time   COLORURINE YELLOW 08/30/2016 0304   APPEARANCEUR CLEAR 08/30/2016 0304   LABSPEC 1.024 08/30/2016 0304   PHURINE 5.0 08/30/2016 0304   GLUCOSEU 150 (A) 08/30/2016 0304   HGBUR NEGATIVE 08/30/2016 0304   BILIRUBINUR NEGATIVE 08/30/2016 0304   KETONESUR 5 (A) 08/30/2016 0304   PROTEINUR 30 (A) 08/30/2016 0304   UROBILINOGEN 1.0 06/30/2014 0209   NITRITE NEGATIVE 08/30/2016 0304   LEUKOCYTESUR NEGATIVE 08/30/2016 0304   Sepsis Labs: Invalid input(s): PROCALCITONIN, LACTICIDVEN  Recent Results (from the past 240 hour(s))  MRSA PCR Screening     Status: None   Collection Time: 10/09/17  5:22 PM  Result Value Ref Range Status  MRSA by PCR NEGATIVE NEGATIVE Final    Comment:        The GeneXpert MRSA Assay (FDA approved for NASAL specimens only), is one component of a comprehensive MRSA colonization surveillance program. It is not intended to diagnose MRSA infection nor to guide or monitor treatment for MRSA infections. Performed at [facility], [address], [place], [place] [address]   Culture, blood (x 2)     Status: None (Preliminary result)   Collection Time: 10/09/17  6:36 PM  Result Value Ref Range Status   Specimen Description BLOOD LEFT ANTECUBITAL  Final   Special Requests   Final    BOTTLES DRAWN AEROBIC ONLY Blood Culture adequate volume   Culture   Final    NO GROWTH 4 DAYS Performed at [facility], [address] [place], [place] [address]    Report Status PENDING  Incomplete  Culture, blood (x 2)     Status: None (Preliminary result)   Collection Time: 10/09/17  6:36 PM  Result Value Ref Range Status   Specimen Description BLOOD LEFT HAND  Final   Special Requests   Final    BOTTLES DRAWN AEROBIC ONLY Blood Culture results may not be optimal due to an inadequate volume of blood received  in culture bottles   Culture   Final    NO GROWTH 4 DAYS Performed at [facility], [address] [place], [place] [address]    Report Status PENDING  Incomplete  Surgical PCR screen     Status: None   Collection Time: 10/11/17  5:41 AM  Result Value Ref Range Status   MRSA, PCR NEGATIVE NEGATIVE Final   Staphylococcus aureus NEGATIVE NEGATIVE Final    Comment: (NOTE) The Xpert SA Assay (FDA approved for NASAL specimens in patients 44 years of age and older), is one component of a comprehensive surveillance program. It is not intended to diagnose infection nor to guide or monitor treatment. Performed at [facility], [address] [place], [place] [address]       Radiology Studies: Dg Chest 2 View  Result Date: 10/13/2017 CLINICAL DATA:  Pacemaker placement. EXAM: CHEST - 2 VIEW COMPARISON:  Radiograph of October 07, 2017. FINDINGS: Stable cardiomegaly. Interval placement of left-sided pacemaker with leads in grossly good position. No pneumothorax is noted. Mild bilateral pleural effusions are noted. Bony thorax is unremarkable. Mild bibasilar subsegmental atelectasis is present. IMPRESSION: Mild bibasilar subsegmental atelectasis with mild pleural effusions. Interval placement of left-sided pacemaker with leads in grossly good position. No pneumothorax is noted. Electronically Signed   By: Lupita Raider, M.D.   On: 10/13/2017 08:45   Dg Cholangiogram Operative  Result Date: 10/11/2017 CLINICAL DATA:  Gallstones EXAM: INTRAOPERATIVE CHOLANGIOGRAM TECHNIQUE: Cholangiographic images from the C-arm fluoroscopic device were submitted for interpretation post-operatively. Please see the procedural report for the amount of contrast and the fluoroscopy time utilized. COMPARISON:  None. FINDINGS: Contrast fills the biliary tree and duodenum without filling defects in the common bile duct. IMPRESSION: Patent biliary tree. Electronically Signed   By: Jolaine Click M.D.   On: 10/11/2017  16:56     Scheduled Meds: . amLODipine  10 mg Oral Daily  . aspirin EC  81 mg Oral Daily  . Brinzolamide-Brimonidine  1 drop Ophthalmic BID  . enoxaparin (LOVENOX) injection  40 mg Subcutaneous Q24H  . insulin aspart  0-15 Units Subcutaneous Q4H  . insulin glargine  20 Units Subcutaneous QHS  .  latanoprost  1 drop Both Eyes QHS  . metoCLOPramide  10 mg Oral Q6H  .  morphine injection  4 mg Intravenous Once  . ondansetron (ZOFRAN) IV  4 mg Intravenous Once  . pravastatin  20 mg Oral QHS  . sodium chloride flush  3 mL Intravenous Q12H  . sodium chloride flush  3 mL Intravenous Q12H   Continuous Infusions: . sodium chloride Stopped (10/12/17 1754)  . sodium chloride 10 mL/hr at 10/13/17 1300  . lactated ringers    . piperacillin-tazobactam (ZOSYN)  IV 3.375 g (10/13/17 1308)     Pamella Pert, MD, PhD [facility] Pager [phone]  If 7PM-7AM, please contact night-coverage [email or web address] Password [facility] 10/13/2017, 1:54 PM

## 2017-10-14 DIAGNOSIS — R1012 Left upper quadrant pain: Secondary | ICD-10-CM | POA: Diagnosis not present

## 2017-10-14 DIAGNOSIS — R1011 Right upper quadrant pain: Secondary | ICD-10-CM | POA: Diagnosis not present

## 2017-10-14 DIAGNOSIS — R001 Bradycardia, unspecified: Secondary | ICD-10-CM | POA: Diagnosis not present

## 2017-10-14 DIAGNOSIS — K8063 Calculus of gallbladder and bile duct with acute cholecystitis with obstruction: Secondary | ICD-10-CM | POA: Diagnosis not present

## 2017-10-14 DIAGNOSIS — E118 Type 2 diabetes mellitus with unspecified complications: Secondary | ICD-10-CM | POA: Diagnosis not present

## 2017-10-14 DIAGNOSIS — K5900 Constipation, unspecified: Secondary | ICD-10-CM | POA: Diagnosis not present

## 2017-10-14 DIAGNOSIS — R1010 Upper abdominal pain, unspecified: Secondary | ICD-10-CM | POA: Diagnosis not present

## 2017-10-14 DIAGNOSIS — E139 Other specified diabetes mellitus without complications: Secondary | ICD-10-CM | POA: Diagnosis not present

## 2017-10-14 DIAGNOSIS — R278 Other lack of coordination: Secondary | ICD-10-CM | POA: Diagnosis not present

## 2017-10-14 DIAGNOSIS — I10 Essential (primary) hypertension: Secondary | ICD-10-CM | POA: Diagnosis not present

## 2017-10-14 DIAGNOSIS — D649 Anemia, unspecified: Secondary | ICD-10-CM | POA: Diagnosis not present

## 2017-10-14 DIAGNOSIS — B029 Zoster without complications: Secondary | ICD-10-CM | POA: Diagnosis not present

## 2017-10-14 DIAGNOSIS — I441 Atrioventricular block, second degree: Secondary | ICD-10-CM | POA: Diagnosis not present

## 2017-10-14 DIAGNOSIS — M2550 Pain in unspecified joint: Secondary | ICD-10-CM | POA: Diagnosis not present

## 2017-10-14 DIAGNOSIS — E119 Type 2 diabetes mellitus without complications: Secondary | ICD-10-CM | POA: Diagnosis not present

## 2017-10-14 DIAGNOSIS — I442 Atrioventricular block, complete: Secondary | ICD-10-CM | POA: Diagnosis not present

## 2017-10-14 DIAGNOSIS — M6281 Muscle weakness (generalized): Secondary | ICD-10-CM | POA: Diagnosis not present

## 2017-10-14 DIAGNOSIS — N183 Chronic kidney disease, stage 3 (moderate): Secondary | ICD-10-CM | POA: Diagnosis not present

## 2017-10-14 DIAGNOSIS — Z794 Long term (current) use of insulin: Secondary | ICD-10-CM

## 2017-10-14 DIAGNOSIS — K810 Acute cholecystitis: Secondary | ICD-10-CM | POA: Diagnosis not present

## 2017-10-14 DIAGNOSIS — R41841 Cognitive communication deficit: Secondary | ICD-10-CM | POA: Diagnosis not present

## 2017-10-14 DIAGNOSIS — Z7401 Bed confinement status: Secondary | ICD-10-CM | POA: Diagnosis not present

## 2017-10-14 DIAGNOSIS — Z950 Presence of cardiac pacemaker: Secondary | ICD-10-CM | POA: Diagnosis not present

## 2017-10-14 LAB — CULTURE, BLOOD (ROUTINE X 2)
CULTURE: NO GROWTH
Culture: NO GROWTH
Special Requests: ADEQUATE

## 2017-10-14 LAB — GLUCOSE, CAPILLARY
Glucose-Capillary: 101 mg/dL — ABNORMAL HIGH (ref 70–99)
Glucose-Capillary: 119 mg/dL — ABNORMAL HIGH (ref 70–99)
Glucose-Capillary: 150 mg/dL — ABNORMAL HIGH (ref 70–99)
Glucose-Capillary: 158 mg/dL — ABNORMAL HIGH (ref 70–99)

## 2017-10-14 MED ORDER — HYDROCODONE-ACETAMINOPHEN 5-325 MG PO TABS: 1.0000 | ORAL_TABLET | ORAL | 0 refills | Status: AC | PRN

## 2017-10-14 NOTE — Progress Notes (Signed)
[facility] Progress Note  2 Days Post-Op  Subjective: CC:  No new complaints. Persistent L hip pain limiting his mobility - improved with oxycodone but this puts him to sleep. Pt denies abdominal complaints. Tolerating diet. +flatus.   Objective: Vital signs in last 24 hours: Temp:  [99.2 F (37.3 C)-99.7 F (37.6 C)] 99.6 F (37.6 C) (07/13 0751) Pulse Rate:  [61-80] 72 (07/13 0751) Resp:  [16-34] 18 (07/13 0751) BP: (134-177)/(52-83) 155/67 (07/13 0751) SpO2:  [92 %-97 %] 96 % (07/13 0751) Last BM Date: (reglan po)  Intake/Output from previous day: 07/12 0701 - 07/13 0700 In: 1527.9 [P.O.:1200; I.V.:162.6; IV Piggyback:165.3] Out: 1200 [Urine:1200] Intake/Output this shift: No intake/output data recorded.  PE: Gen:  Alert, NAD, pleasant Card:  Regular rate and rhythm Pulm:  Normal effort Abd: Soft, non-tender, mild distention, bowel sounds present in all 4 quadrants, no HSM, incisions C/D/I Skin: warm and dry, no rashes  Psych: A&Ox3   Lab Results:  Recent Labs    10/12/17 0257 10/13/17 0212  WBC 7.4 6.4  HGB 9.4* 9.5*  HCT 30.1* 30.1*  PLT 252 261   BMET Recent Labs    10/12/17 0257 10/13/17 0212  NA 138 140  K 3.5 3.2*  CL 103 105  CO2 25 25  GLUCOSE 195* 151*  BUN 19 18  CREATININE 1.81* 1.36*  CALCIUM 8.0* 7.9*   PT/INR No results for input(s): LABPROT, INR in the last 72 hours. CMP     Component Value Date/Time   NA 140 10/13/2017 0212   K 3.2 (L) 10/13/2017 0212   CL 105 10/13/2017 0212   CO2 25 10/13/2017 0212   GLUCOSE 151 (H) 10/13/2017 0212   BUN 18 10/13/2017 0212   CREATININE 1.36 (H) 10/13/2017 0212   CALCIUM 7.9 (L) 10/13/2017 0212   PROT 6.1 (L) 10/13/2017 0212   ALBUMIN 2.7 (L) 10/13/2017 0212   AST 28 10/13/2017 0212   ALT 28 10/13/2017 0212   ALKPHOS 48 10/13/2017 0212   BILITOT 1.3 (H) 10/13/2017 0212   GFRNONAA 49 (L) 10/13/2017 0212   GFRAA 57 (L) 10/13/2017 0212   Lipase     Component Value  Date/Time   LIPASE 36 10/09/2017 0938       Studies/Results: Dg Chest 2 View  Result Date: 10/13/2017 CLINICAL DATA:  Pacemaker placement. EXAM: CHEST - 2 VIEW COMPARISON:  Radiograph of October 07, 2017. FINDINGS: Stable cardiomegaly. Interval placement of left-sided pacemaker with leads in grossly good position. No pneumothorax is noted. Mild bilateral pleural effusions are noted. Bony thorax is unremarkable. Mild bibasilar subsegmental atelectasis is present. IMPRESSION: Mild bibasilar subsegmental atelectasis with mild pleural effusions. Interval placement of left-sided pacemaker with leads in grossly good position. No pneumothorax is noted. Electronically Signed   By: Lupita RaiderJames  Green Jr, M.D.   On: 10/13/2017 08:45    Anti-infectives: Anti-infectives (From admission, onward)   Start     Dose/Rate Route Frequency Ordered Stop   10/12/17 0900  gentamicin (GARAMYCIN) 80 mg in sodium chloride 0.9 % 500 mL irrigation     80 mg Irrigation To Cath Lab 10/12/17 0817 10/12/17 1415   10/12/17 0900  ceFAZolin (ANCEF) IVPB 2g/100 mL premix     2 g 200 mL/hr over 30 Minutes Intravenous To Cath Lab 10/12/17 0817 10/12/17 1350   10/11/17 1256  ceFAZolin (ANCEF) 2-4 GM/100ML-% IVPB    Note to Pharmacy:  Sandi RavelingSchonewitz, Leigh   : cabinet override      10/11/17 1256 10/12/17  9604   10/11/17 1200  gentamicin (GARAMYCIN) 80 mg in sodium chloride 0.9 % 500 mL irrigation  Status:  Discontinued     80 mg Irrigation To [facility] 10/11/17 0425 10/11/17 0916   10/11/17 0430  ceFAZolin (ANCEF) IVPB 2g/100 mL premix  Status:  Discontinued     2 g 200 mL/hr over 30 Minutes Intravenous To [facility] 10/11/17 0425 10/11/17 0916   10/09/17 1800  piperacillin-tazobactam (ZOSYN) IVPB 3.375 g     3.375 g 12.5 mL/hr over 240 Minutes Intravenous Every 8 hours 10/09/17 1650         Assessment/Plan Complete heart block - s/p temporary pacer, planning for pacemaker placement this admission DM II HTN CKD  III Left hip pain - chronic problem, some radiation down left posterior thigh  Gangrenous cholecystitis S/P laparoscopic cholecystectomy 7/10 Dr. Ezzard Standing -  POD#2, afebrile, VSS -  Continue abx 5-7 days post-op  -  tol reg diet and having flatus  -  Incentive spirometry q 1h, mobilize  -  PT/OT recommend SNF  FEN: carb mod diet  VTE: SCDs, Lovenox  ID: Zosyn 7/8 >> (day#5)  Foley: none   Stable for discharge to SNF [facility]). Leukocytosis resolved. Afebrile. Today is day#5 of abx - I do not think he needs abx at discharge. Spoke with primary team about referral to ortho vs NS at discharge for hip pain.      LOS: 5 days    Adam Phenix , [facility] 10/14/2017, 9:31 AM Pager: [phone] Consults: [phone] Mon-Fri 7:00 am-4:30 pm Sat-Sun 7:00 am-11:30 am

## 2017-10-14 NOTE — Clinical Social Work Placement (Signed)
   CLINICAL SOCIAL WORK PLACEMENT  NOTE  Date:  10/14/2017  Patient Details  Name: Matthew Sellers MRN: [id number] Date of Birth: 10/06/1940  Clinical Social Work is seeking post-discharge placement for this patient at the Skilled  Nursing Facility level of care (*CSW will initial, date and re-position this form in  chart as items are completed):  Yes   Patient/family provided with [place] Clinical Social Work Department's list of facilities offering this level of care within the geographic area requested by the patient (or if unable, by the patient's family).  Yes   Patient/family informed of their freedom to choose among providers that offer the needed level of care, that participate in Medicare, Medicaid or managed care program needed by the patient, have an available bed and are willing to accept the patient.  Yes   Patient/family informed of [facility]'s ownership interest in [facility] and [facility], as well as of the fact that they are under no obligation to receive care at these facilities.  PASRR submitted to EDS on 10/13/17     PASRR number received on 10/13/17     Existing PASRR number confirmed on       FL2 transmitted to all facilities in geographic area requested by pt/family on 10/13/17     FL2 transmitted to all facilities within larger geographic area on       Patient informed that his/her managed care company has contracts with or will negotiate with certain facilities, including the following:        Yes   Patient/family informed of bed offers received.  [facility]     Physician recommends and patient chooses bed at      [facility]  Patient to be transferred to facility by [facility]     Patient family notified on 10/14/17 of transfer.  Name of family member notified:  [name]     PHYSICIAN Please sign FL2     Additional Comment:     _______________________________________________ Matthew KrabbeBridget A Noelle Sease, LCSW 10/14/2017, 10:17 AM

## 2017-10-14 NOTE — Discharge Summary (Signed)
Physician Discharge Summary  Matthew Sellers ZOX:[id number] DOB: [birth year] DOA: 10/09/2017  PCP: [facility]  Admit date: 10/09/2017 Discharge date: 10/14/2017  Admitted From: home Disposition:  SNF - [facility]  Recommendations for Outpatient Follow-up:  1. Follow up with PCP in 1-2 weeks 2. Follow-up with general surgery in 2 weeks 3. Follow-up with cardiology in 2 weeks  Home Health: none Equipment/Devices: none  Discharge Condition: stable CODE STATUS: Full code Diet recommendation: heart healthy  HPI: Per Dr. Ophelia Charter, Matthew Sellers is a 77 y.o. male with medical history significant of HTN; DM; HLD; CAD; and bradycardia presenting with upper abdominal pain.  Patient presented to the ER on Saturday for chest and abdominal pain.  +N/V.  In the ER, he was seen, had studies (CT, [place]), and discharged home.  They called him back the next morning due to "something wrong with the EKG and they coulnd't read it or whatever."  He waited until today and then went to the [place] and the [place] sent him here. He was having stomach cramps, "like from bad food or something like that."  It is still uneasy, but somewhat better.  Started periumbilical, now midepigastric and RUQ.  At its worst, it was 9-10/10, currently 3-4/10.  He is no longer having N/V, has not had emesis since Saturday morning.  No fevers, although "he was pretty warm last night" - about 0430 he was given a pill for stomach upset.  He did not sleep well due to stomach pain last night. His heart rate has been low for "quite a while."  Maybe as long as 12 years.  He did complain of some chest pressure on Saturday but none since.  He is light-headed/dizzy as soon as he stands up, and this has been happening for maybe over a year or up to 5-6 years.  Hospital Course: Abdominal painwith gallbladder sludge and gallstones, Cholecystitis -Ultrasound abdomen showed gallbladder sludge, gallstones without obvious acute cholecystitis. HIDA  scanshowed nonvisualization of the gallbladder indicating cystic duct obstruction and acute cholecystitis.  General surgery was consulted and followed patient while hospitalized.  Because of his complete heart block his surgery was delayed and cardiology was consulted.  Patient had indication for a pacemaker, he had temporary pacing placed and eventually underwent laparoscopic cholecystectomy on 7/10 by Dr. Ezzard Standing.  He recovered well postop, his diet was advanced, he is able to tolerate regular diet without abdominal pain, nausea or vomiting and will be discharged to SNF in stable condition.  He was maintained on IV antibiotics while hospitalized, he is afebrile, his white count has normalized and will not need further antibiotics on discharge.  He will have follow-up with general surgery as an outpatient. Bradycardia with intermittent complete heart block -History reviewed shows complete heart block. EP cardiology consulted, patient had a temporary pacemaker placed prior to surgery and this was converted to a permanent pacemaker which was placed on 7/11.  His telemetry remained stable, he has a paced rhythm.  He will need outpatient follow-up with electrophysiology. Diabetes (HCC)mellitus type II, uncontrolled with hypoglycemia -Hemoglobin A1c 7.5, continue home regimen Hypokalemia-Replaced, please recheck a BMP in 3 to 4 days Hypertension -Continue Norvasc, hold lisinopril hydrochlorothiazide on discharge due to slight increase in creatinine, please recheck a BMP in 3 to 4 days, monitor blood pressure and resume lisinopril hydrochlorothiazide at that time. Mild acute onCKD (chronic kidney disease) stage 3, GFR 30-59 ml/min (HCC) -Baseline creatinine 1.2-1.3, slight increase to 1.8 post op but coming  back down to 1.3 on discharge Left hip pain -this is been bothering patient for the past couple of months, it started as severe low back pain preventing him to stand upright, with pain shooting down his left  leg.  After some activities during the day his pain is improving.  He would benefit from ongoing physical therapy and referral as an outpatient to orthopedic surgery   Discharge Diagnoses:  Principal Problem:   Abdominal pain Active Problems:   Diabetes (HCC)   Hypertension   Hyperlipidemia   Fatty liver   CKD (chronic kidney disease) stage 3, GFR 30-59 ml/min (HCC)   Bradycardia   Complete heart block (HCC)   Discharge Instructions  Allergies as of 10/14/2017   No Known Allergies     Medication List    STOP taking these medications   lisinopril-hydrochlorothiazide 20-25 MG tablet Commonly known as:  PRINZIDE,ZESTORETIC     TAKE these medications   amLODipine 10 MG tablet Commonly known as:  NORVASC Take 10 mg by mouth daily.   aspirin EC 81 MG tablet Take 81 mg by mouth daily.   bismuth subsalicylate 262 MG/15ML suspension Commonly known as:  PEPTO BISMOL Take 30 mLs by mouth as needed for indigestion.   Brinzolamide-Brimonidine 1-0.2 % Susp Place 1 drop into both eyes 2 (two) times daily.   fluticasone 50 MCG/ACT nasal spray Commonly known as:  FLONASE Place 2 sprays into both nostrils daily as needed for allergies or rhinitis.   HYDROcodone-acetaminophen 5-325 MG tablet Commonly known as:  NORCO/VICODIN Take 1-2 tablets by mouth every 4 (four) hours as needed for moderate pain.   insulin glargine 100 UNIT/ML injection Commonly known as:  LANTUS Inject 30 Units into the skin at bedtime.   Krill Oil 300 MG Caps Take 300 mg by mouth daily.   latanoprost 0.005 % ophthalmic solution Commonly known as:  XALATAN Place 1 drop into both eyes at bedtime.   metFORMIN 1000 MG tablet Commonly known as:  GLUCOPHAGE Take 1,000 mg by mouth 2 (two) times daily with a meal.   metoCLOPramide 10 MG tablet Commonly known as:  REGLAN Take 1 tablet (10 mg total) by mouth every 6 (six) hours.   nitroGLYCERIN 0.4 MG SL tablet Commonly known as:  NITROSTAT Place 0.4  mg under the tongue every 5 (five) minutes as needed for chest pain.   ondansetron 4 MG disintegrating tablet Commonly known as:  ZOFRAN ODT Take 1 tablet (4 mg total) by mouth every 8 (eight) hours as needed for nausea or vomiting.   pravastatin 20 MG tablet Commonly known as:  PRAVACHOL Take 20 mg by mouth at bedtime.       Contact information for follow-up providers    [facility] [address] [facility] Follow up on 10/23/2017.   Specialty:  Cardiology Why:  at [facility] information: [address], [address] [place] [address] [phone]       Ovidio Kin, MD. Go on 10/27/2017.   Specialty:  General Surgery Why:  Your appointment is 10/27/17 @ 1:30pm Please arrive 30 minutes prior to your appointment to check in and fill out paperwork. Bring photo ID and insurance information. Contact information: [address] [place] [address] [phone]            Contact information for after-discharge care    Destination    [facility] .   Service:  Skilled Nursing Contact information: [address] [facility]  [phone]                 Consultations:  General Surgery   Cardiology   Procedures/Studies:  PPM placement 7/11 -RA/LV leads medtronic (serial number [id number]) right atrial lead and a Medtronic 3830 (serial number [id number]) right ventricular lead  -medtronic (serial number [id number]) pacemaker   Lap chole 7/10  2D echo  Study Conclusions - Left ventricle: The cavity size was normal. Wall thickness was increased increased in a pattern of mild to moderate LVH. There was moderate focal basal hypertrophy of the septum. Systolic function was normal. The estimated ejection fraction was in the  range of 55% to 60%. Wall motion was normal; there were no regional wall motion abnormalities. Doppler parameters are consistent with abnormal left ventricular relaxation (grade 1  diastolic dysfunction). - Left atrium: The atrium was mildly to moderately dilated.  Impressions: - Normal ejection fraction without wall motion abnormalities.  Dg Chest 2 View  Result Date: 10/13/2017 CLINICAL DATA:  Pacemaker placement. EXAM: CHEST - 2 VIEW COMPARISON:  Radiograph of October 07, 2017. FINDINGS: Stable cardiomegaly. Interval placement of left-sided pacemaker with leads in grossly good position. No pneumothorax is noted. Mild bilateral pleural effusions are noted. Bony thorax is unremarkable. Mild bibasilar subsegmental atelectasis is present. IMPRESSION: Mild bibasilar subsegmental atelectasis with mild pleural effusions. Interval placement of left-sided pacemaker with leads in grossly good position. No pneumothorax is noted. Electronically Signed   By: Lupita RaiderJames  Green Jr, M.D.   On: 10/13/2017 08:45   Dg Chest 2 View  Result Date: 10/07/2017 CLINICAL DATA:  Vomiting EXAM: CHEST - 2 VIEW COMPARISON:  08/30/2016 FINDINGS: Mild cardiomegaly. Aortic atherosclerosis. Both lungs are clear. The visualized skeletal structures are unremarkable. IMPRESSION: No active cardiopulmonary disease. Electronically Signed   By: Jasmine PangKim  Fujinaga M.D.   On: 10/07/2017 21:27   Dg Cholangiogram Operative  Result Date: 10/11/2017 CLINICAL DATA:  Gallstones EXAM: INTRAOPERATIVE CHOLANGIOGRAM TECHNIQUE: Cholangiographic images from the C-arm fluoroscopic device were submitted for interpretation post-operatively. Please see the procedural report for the amount of contrast and the fluoroscopy time utilized. COMPARISON:  None. FINDINGS: Contrast fills the biliary tree and duodenum without filling defects in the common bile duct. IMPRESSION: Patent biliary tree. Electronically Signed   By: Jolaine ClickArthur  Hoss M.D.   On: 10/11/2017 16:56   Nm Hepato W/eject Fract  Result Date: 10/10/2017 CLINICAL DATA:  Chronic RIGHT UPPER quadrant abdominal pain which acutely worsened 3 days ago, associated with nausea and vomiting. EXAM:  NUCLEAR MEDICINE HEPATOBILIARY IMAGING TECHNIQUE: Sequential images of the abdomen were obtained out to 90 minutes following intravenous administration of radiopharmaceutical. At 90 minutes, 3.0 mg of morphine were administered intravenously and imaging was performed for an additional 30 minutes. RADIOPHARMACEUTICALS:  5.3 mCi Tc-122m Choletec IV COMPARISON:  No prior nuclear medicine hepatobiliary scan. RIGHT UPPER quadrant abdominal ultrasound yesterday which demonstrated gallbladder sludge but no sonographic evidence of acute cholecystitis. FINDINGS: Hepatic uptake of radiotracer is normal and there is prompt excretion into the biliary system. Small bowel activity is visible at approximately 10 minutes. The gallbladder was not visible out to 90 minutes. There is no evidence of tracer retention by the liver. The gallbladder was not visible after intravenous administration of morphine. IMPRESSION: Nonvisualization of the gallbladder after intravenous morphine administration indicating cystic duct obstruction and therefore acute cholecystitis. These results will be called to the ordering clinician or representative by the Radiologist Assistant, and communication documented in the PACS or zVision Dashboard. Electronically Signed   By:  Hulan Saas M.D.   On: 10/10/2017 12:19   US Abdomen Limited  Result Date: 10/08/2017 CLINICAL DATA:  Right upper quadrant pain and vomiting for 4 hours. Gallstones seen on CT. EXAM: ULTRASOUND ABDOMEN LIMITED RIGHT UPPER QUADRANT COMPARISON:  CT chest 10/08/2017 FINDINGS: Examination is technically limited due to patient's body habitus and abdominal distention. Gallbladder: No discrete stones identified in the gallbladder. There is evidence of layering sludge. No wall thickening or edema. Murphy's sign is negative. Common bile duct: Diameter: 6.8 mm, normal Liver: Diffusely increased parenchymal echotexture suggesting fatty infiltration. No focal liver lesions are identified as  visualized. Portal vein is patent on color Doppler imaging with normal direction of blood flow towards the liver. IMPRESSION: Layering sludge in the gallbladder. No stones. No additional changes to suggest cholecystitis. Diffuse fatty infiltration of the liver. Electronically Signed   By: Burman Nieves M.D.   On: 10/08/2017 02:52   Ct Angio Chest/abd/pel For Dissection W And/or W/wo  Result Date: 10/08/2017 CLINICAL DATA:  Vomiting with central dull chest pain EXAM: CT ANGIOGRAPHY CHEST, ABDOMEN AND PELVIS TECHNIQUE: Multidetector CT imaging through the chest, abdomen and pelvis was performed using the standard protocol during bolus administration of intravenous contrast. Multiplanar reconstructed images and MIPs were obtained and reviewed to evaluate the vascular anatomy. CONTRAST:  ISOVUE-370 IOPAMIDOL (ISOVUE-370) INJECTION 76% COMPARISON:  Radiograph 10/07/2017, CT chest 06/01/2009 FINDINGS: CTA CHEST FINDINGS Cardiovascular: Non contrasted images of the chest demonstrate no intramural hematoma. Nonaneurysmal aorta. No dissection seen. Mild aortic atherosclerosis. Minimal coronary vascular calcification. Borderline cardiomegaly. No pericardial effusion Mediastinum/Nodes: No enlarged mediastinal, hilar, or axillary lymph nodes. Thyroid gland, trachea, and esophagus demonstrate no significant findings. Lungs/Pleura: Stable 4 mm right upper lobe subpleural pulmonary nodule. No acute consolidation or effusion. No pneumothorax. Musculoskeletal: No chest wall abnormality. No acute or significant osseous findings. Review of the MIP images confirms the above findings. CTA ABDOMEN AND PELVIS FINDINGS VASCULAR Aorta: Mild aortic atherosclerosis without aneurysm or dissection. No significant stenosis Celiac: Patent without evidence of aneurysm, dissection, vasculitis or significant stenosis. SMA: Patent without evidence of aneurysm, dissection, vasculitis or significant stenosis. Renals: Both renal arteries are  patent without evidence of aneurysm, dissection, vasculitis, fibromuscular dysplasia or significant stenosis. IMA: Patent without evidence of aneurysm, dissection, vasculitis or significant stenosis. Inflow: Patent without evidence of aneurysm, dissection, vasculitis or significant stenosis. Review of the MIP images confirms the above findings. NON-VASCULAR Hepatobiliary: Calcified gallstones. No focal hepatic abnormality or biliary dilatation Pancreas: Unremarkable. No pancreatic ductal dilatation or surrounding inflammatory changes. Spleen: Normal in size without focal abnormality. Adrenals/Urinary Tract: Adrenal glands are unremarkable. Kidneys are normal, without renal calculi, focal lesion, or hydronephrosis. Bladder is unremarkable. Stomach/Bowel: Stomach is within normal limits. Appendix appears normal. No evidence of bowel wall thickening, distention, or inflammatory changes. Lymphatic: No significantly enlarged lymph nodes Reproductive: Prostate is unremarkable. Other: Fat in the left inguinal canal.  No free air or free fluid Musculoskeletal: No acute or significant osseous findings. Review of the MIP images confirms the above findings. IMPRESSION: 1. Negative for aortic aneurysm or dissection. 2. No CT evidence for acute intra-abdominal or pelvic abnormality. 3. No significant vascular stenosis within the abdomen or pelvis. Mild aortic atherosclerosis 4. Gallstones Electronically Signed   By: Jasmine Pang M.D.   On: 10/08/2017 01:26   US Abdomen Limited Ruq  Result Date: 10/09/2017 CLINICAL DATA:  Right upper quadrant pain EXAM: ULTRASOUND ABDOMEN LIMITED RIGHT UPPER QUADRANT COMPARISON:  10/08/2017 FINDINGS: Gallbladder: Gallbladder is physiologically distended and  contains small calculi biliary sludge along the dependent aspect. No wall thickening or pericholecystic fluid is identified. No sonographic Murphy sign noted by sonographer. Common bile duct: Diameter: 5.3 mm Liver: No focal lesion  identified. Within normal limits in parenchymal echogenicity. Portal vein is patent on color Doppler imaging with normal direction of blood flow towards the liver. IMPRESSION: Cholelithiasis with biliary sludge is redemonstrated without secondary signs of acute cholecystitis. Electronically Signed   By: Tollie Eth M.D.   On: 10/09/2017 14:57      Subjective: - no chest pain, shortness of breath, no abdominal pain, nausea or vomiting.   Discharge Exam: Vitals:   10/14/17 0420 10/14/17 0751  BP: (!) 147/63 (!) 155/67  Pulse: 64 72  Resp: 20 18  Temp: 99.2 F (37.3 C) 99.6 F (37.6 C)  SpO2: 95% 96%    General: Pt is alert, awake, not in acute distress Cardiovascular: RRR, S1/S2 +, no rubs, no gallops Respiratory: CTA bilaterally, no wheezing, no rhonchi Abdominal: Soft, NT, ND, bowel sounds + Extremities: no edema, no cyanosis    The results of significant diagnostics from this hospitalization (including imaging, microbiology, ancillary and laboratory) are listed below for reference.     Microbiology: Recent Results (from the past 240 hour(s))  MRSA PCR Screening     Status: None   Collection Time: 10/09/17  5:22 PM  Result Value Ref Range Status   MRSA by PCR NEGATIVE NEGATIVE Final    Comment:        The GeneXpert MRSA Assay (FDA approved for NASAL specimens only), is one component of a comprehensive MRSA colonization surveillance program. It is not intended to diagnose MRSA infection nor to guide or monitor treatment for MRSA infections. Performed at [facility], [address], [place], [place] [address]   Culture, blood (x 2)     Status: None (Preliminary result)   Collection Time: 10/09/17  6:36 PM  Result Value Ref Range Status   Specimen Description BLOOD LEFT ANTECUBITAL  Final   Special Requests   Final    BOTTLES DRAWN AEROBIC ONLY Blood Culture adequate volume   Culture   Final    NO GROWTH 4 DAYS Performed at [facility], [address], [place], [place] [address]    Report Status PENDING  Incomplete  Culture, blood (x 2)     Status: None (Preliminary result)   Collection Time: 10/09/17  6:36 PM  Result Value Ref Range Status   Specimen Description BLOOD LEFT HAND  Final   Special Requests   Final    BOTTLES DRAWN AEROBIC ONLY Blood Culture results may not be optimal due to an inadequate volume of blood received in culture bottles   Culture   Final    NO GROWTH 4 DAYS Performed at [facility], [address] [place], [place] [address]    Report Status PENDING  Incomplete  Surgical PCR screen     Status: None   Collection Time: 10/11/17  5:41 AM  Result Value Ref Range Status   MRSA, PCR NEGATIVE NEGATIVE Final   Staphylococcus aureus NEGATIVE NEGATIVE Final    Comment: (NOTE) The Xpert SA Assay (FDA approved for NASAL specimens in patients 48 years of age and older), is one component of a comprehensive surveillance program. It is not intended to diagnose infection nor to guide or monitor treatment. Performed at [facility], [address], [place], [place] [address]  Labs: BNP (last 3 results) No results for input(s): BNP in the last 8760 hours. Basic Metabolic Panel: Recent Labs  Lab 10/09/17 0938 10/10/17 0413 10/10/17 1128 10/11/17 0340 10/12/17 0257 10/13/17 0212  NA 138 138  --  140 138 140  K 3.3* 2.8*  --  3.0* 3.5 3.2*  CL 100 97*  --  101 103 105  CO2 28 28  --  27 25 25   GLUCOSE 223* 98  --  118* 195* 151*  BUN 25* 24*  --  15 19 18   CREATININE 1.61* 1.61*  --  1.58* 1.81* 1.36*  CALCIUM 9.1 9.0  --  8.8* 8.0* 7.9*  MG 1.7  --  2.0  --   --   --   PHOS 3.2  --   --   --   --   --    Liver Function Tests: Recent Labs  Lab 10/08/17 0134 10/09/17 0938 10/11/17 0340 10/12/17 0257 10/13/17 0212  AST 17 20 19  47* 28  ALT 14 12 14  37 28  ALKPHOS 37* 38 47 49 48  BILITOT 1.1 1.9* 2.9* 1.9* 1.3*  PROT 7.0 6.6 6.4* 6.0* 6.1*  ALBUMIN 4.0 3.6 3.0* 2.8* 2.7*    Recent Labs  Lab 10/09/17 0938  LIPASE 36   No results for input(s): AMMONIA in the last 168 hours. CBC: Recent Labs  Lab 10/09/17 0938 10/10/17 0413 10/11/17 0340 10/12/17 0257 10/13/17 0212  WBC 11.3* 9.6 8.1 7.4 6.4  NEUTROABS  --   --   --  5.5  --   HGB 11.3* 11.0* 10.6* 9.4* 9.5*  HCT 35.4* 34.9* 33.2* 30.1* 30.1*  MCV 89.4 89.7 88.1 89.9 88.8  PLT 240 249 254 252 261   Cardiac Enzymes: Recent Labs  Lab 10/09/17 0938 10/09/17 1732 10/09/17 2149 10/10/17 0413  TROPONINI 0.04* 0.03* 0.04* 0.04*   BNP: Invalid input(s): POCBNP CBG: Recent Labs  Lab 10/13/17 1545 10/13/17 2127 10/14/17 0043 10/14/17 0417 10/14/17 0750  GLUCAP 196* 155* 158* 101* 119*   D-Dimer No results for input(s): DDIMER in the last 72 hours. Hgb A1c No results for input(s): HGBA1C in the last 72 hours. Lipid Profile No results for input(s): CHOL, HDL, LDLCALC, TRIG, CHOLHDL, LDLDIRECT in the last 72 hours. Thyroid function studies No results for input(s): TSH, T4TOTAL, T3FREE, THYROIDAB in the last 72 hours.  Invalid input(s): FREET3 Anemia work up No results for input(s): VITAMINB12, FOLATE, FERRITIN, TIBC, IRON, RETICCTPCT in the last 72 hours. Urinalysis    Component Value Date/Time   COLORURINE YELLOW 08/30/2016 0304   APPEARANCEUR CLEAR 08/30/2016 0304   LABSPEC 1.024 08/30/2016 0304   PHURINE 5.0 08/30/2016 0304   GLUCOSEU 150 (A) 08/30/2016 0304   HGBUR NEGATIVE 08/30/2016 0304   BILIRUBINUR NEGATIVE 08/30/2016 0304   KETONESUR 5 (A) 08/30/2016 0304   PROTEINUR 30 (A) 08/30/2016 0304   UROBILINOGEN 1.0 06/30/2014 0209   NITRITE NEGATIVE 08/30/2016 0304   LEUKOCYTESUR NEGATIVE 08/30/2016 0304   Sepsis Labs Invalid input(s): PROCALCITONIN,  WBC,  LACTICIDVEN   Time coordinating discharge: 45 minutes  SIGNED:  Pamella Pert, MD  [facility] 10/14/2017, 9:52 AM Pager [phone]  If 7PM-7AM, please contact night-coverage [email or web address] Password  [email or web address]

## 2017-10-14 NOTE — Progress Notes (Signed)
[facility]. Patient IV's were taken out. AVS was sent with patient's family. Patient belongings were sent with patient. Gave report to RN at [facility].   Lillia PaulsLaura B. RN

## 2017-10-14 NOTE — Clinical Social Work Note (Signed)
Clinical Social Worker facilitated patient discharge including contacting patient family and facility to confirm patient discharge plans.  Clinical information faxed to facility and family agreeable with plan.  CSW arranged [facility].  RN to call [phone] for report prior to discharge.  Clinical Social Worker will sign off for now as social work intervention is no longer needed. Please consult us again if new need arises.  Velora MediateBridget Aidden Markovic, MSW [phone]

## 2017-10-16 DIAGNOSIS — I442 Atrioventricular block, complete: Secondary | ICD-10-CM | POA: Diagnosis not present

## 2017-10-16 DIAGNOSIS — I10 Essential (primary) hypertension: Secondary | ICD-10-CM | POA: Diagnosis not present

## 2017-10-16 DIAGNOSIS — E139 Other specified diabetes mellitus without complications: Secondary | ICD-10-CM | POA: Diagnosis not present

## 2017-10-16 DIAGNOSIS — K810 Acute cholecystitis: Secondary | ICD-10-CM | POA: Diagnosis not present

## 2017-10-17 DIAGNOSIS — B029 Zoster without complications: Secondary | ICD-10-CM | POA: Diagnosis not present

## 2017-10-18 DIAGNOSIS — I10 Essential (primary) hypertension: Secondary | ICD-10-CM | POA: Diagnosis not present

## 2017-10-18 DIAGNOSIS — D649 Anemia, unspecified: Secondary | ICD-10-CM | POA: Diagnosis not present

## 2017-10-18 DIAGNOSIS — B029 Zoster without complications: Secondary | ICD-10-CM | POA: Diagnosis not present

## 2017-10-18 DIAGNOSIS — K5900 Constipation, unspecified: Secondary | ICD-10-CM | POA: Diagnosis not present

## 2017-10-19 DIAGNOSIS — K810 Acute cholecystitis: Secondary | ICD-10-CM | POA: Diagnosis not present

## 2017-10-19 DIAGNOSIS — I10 Essential (primary) hypertension: Secondary | ICD-10-CM | POA: Diagnosis not present

## 2017-10-19 DIAGNOSIS — D649 Anemia, unspecified: Secondary | ICD-10-CM | POA: Diagnosis not present

## 2017-10-19 DIAGNOSIS — I442 Atrioventricular block, complete: Secondary | ICD-10-CM | POA: Diagnosis not present

## 2017-10-23 ENCOUNTER — Ambulatory Visit (INDEPENDENT_AMBULATORY_CARE_PROVIDER_SITE_OTHER): Payer: Medicare Other | Admitting: *Deleted

## 2017-10-23 DIAGNOSIS — R001 Bradycardia, unspecified: Secondary | ICD-10-CM | POA: Diagnosis not present

## 2017-10-23 LAB — CUP PACEART INCLINIC DEVICE CHECK
Battery Remaining Longevity: 79 mo
Battery Voltage: 3.14 V
Brady Statistic AP VS Percent: 0 %
Brady Statistic AS VP Percent: 57.76 %
Brady Statistic RA Percent Paced: 42.15 %
Implantable Lead Implant Date: 20190711
Implantable Lead Implant Date: 20190711
Implantable Lead Location: 753860
Implantable Lead Model: 5076
Implantable Pulse Generator Implant Date: 20190711
Lead Channel Impedance Value: 285 Ohm
Lead Channel Impedance Value: 418 Ohm
Lead Channel Pacing Threshold Amplitude: 0.875 V
Lead Channel Pacing Threshold Pulse Width: 0.4 ms
Lead Channel Sensing Intrinsic Amplitude: 17.5 mV
Lead Channel Sensing Intrinsic Amplitude: 2.75 mV
Lead Channel Sensing Intrinsic Amplitude: 2.875 mV
Lead Channel Setting Sensing Sensitivity: 1.2 mV
MDC IDC LEAD LOCATION: 753859
MDC IDC MSMT LEADCHNL RA PACING THRESHOLD PULSEWIDTH: 0.4 ms
MDC IDC MSMT LEADCHNL RV IMPEDANCE VALUE: 399 Ohm
MDC IDC MSMT LEADCHNL RV IMPEDANCE VALUE: 532 Ohm
MDC IDC MSMT LEADCHNL RV PACING THRESHOLD AMPLITUDE: 0.75 V
MDC IDC SESS DTM: 20190722153541
MDC IDC SET LEADCHNL RA PACING AMPLITUDE: 3.5 V
MDC IDC SET LEADCHNL RV PACING AMPLITUDE: 3.5 V
MDC IDC SET LEADCHNL RV PACING PULSEWIDTH: 1 ms
MDC IDC STAT BRADY AP VP PERCENT: 42.13 %
MDC IDC STAT BRADY AS VS PERCENT: 0.11 %
MDC IDC STAT BRADY RV PERCENT PACED: 99.89 %

## 2017-10-23 NOTE — Progress Notes (Signed)
Wound check appointment. Steri-strips removed. Wound without redness or edema. Incision edges approximated, wound well healed. Normal device function. Thresholds, sensing, and impedances consistent with implant measurements. Non-selective capture from 5V to LOC (confirmed w/GT). Device programmed at 3.5V for extra safety margin until 3 month visit. Histogram distribution appropriate for patient and level of activity. No mode switches or high ventricular rates noted. Patient educated about wound care, arm mobility, lifting restrictions. ROV in 3 months with GT.

## 2017-10-25 DIAGNOSIS — Z48815 Encounter for surgical aftercare following surgery on the digestive system: Secondary | ICD-10-CM | POA: Diagnosis not present

## 2017-10-25 DIAGNOSIS — I442 Atrioventricular block, complete: Secondary | ICD-10-CM | POA: Diagnosis not present

## 2017-10-26 DIAGNOSIS — Z48815 Encounter for surgical aftercare following surgery on the digestive system: Secondary | ICD-10-CM | POA: Diagnosis not present

## 2017-10-26 DIAGNOSIS — I442 Atrioventricular block, complete: Secondary | ICD-10-CM | POA: Diagnosis not present

## 2017-10-30 DIAGNOSIS — Z48815 Encounter for surgical aftercare following surgery on the digestive system: Secondary | ICD-10-CM | POA: Diagnosis not present

## 2017-10-30 DIAGNOSIS — I442 Atrioventricular block, complete: Secondary | ICD-10-CM | POA: Diagnosis not present

## 2017-11-01 DIAGNOSIS — Z48815 Encounter for surgical aftercare following surgery on the digestive system: Secondary | ICD-10-CM | POA: Diagnosis not present

## 2017-11-01 DIAGNOSIS — I442 Atrioventricular block, complete: Secondary | ICD-10-CM | POA: Diagnosis not present

## 2017-11-02 DIAGNOSIS — Z48815 Encounter for surgical aftercare following surgery on the digestive system: Secondary | ICD-10-CM | POA: Diagnosis not present

## 2017-11-02 DIAGNOSIS — I442 Atrioventricular block, complete: Secondary | ICD-10-CM | POA: Diagnosis not present

## 2017-11-06 DIAGNOSIS — I442 Atrioventricular block, complete: Secondary | ICD-10-CM | POA: Diagnosis not present

## 2017-11-06 DIAGNOSIS — Z48815 Encounter for surgical aftercare following surgery on the digestive system: Secondary | ICD-10-CM | POA: Diagnosis not present

## 2017-11-08 DIAGNOSIS — I442 Atrioventricular block, complete: Secondary | ICD-10-CM | POA: Diagnosis not present

## 2017-11-08 DIAGNOSIS — Z48815 Encounter for surgical aftercare following surgery on the digestive system: Secondary | ICD-10-CM | POA: Diagnosis not present

## 2017-11-09 DIAGNOSIS — I442 Atrioventricular block, complete: Secondary | ICD-10-CM | POA: Diagnosis not present

## 2017-11-09 DIAGNOSIS — Z48815 Encounter for surgical aftercare following surgery on the digestive system: Secondary | ICD-10-CM | POA: Diagnosis not present

## 2017-11-10 ENCOUNTER — Encounter: Payer: Self-pay | Admitting: Nurse Practitioner

## 2017-11-28 NOTE — Progress Notes (Signed)
Electrophysiology Office Note Date: 12/01/2017  ID:  Matthew Sellers, DOB 07-02-1940, MRN [id number]  PCP: System, Pcp Not In Electrophysiologist: Ladona Ridgelaylor  CC: Pacemaker follow-up  Matthew MachoHoward F Sellers is a 77 y.o. male seen today for Dr Ladona Ridgelaylor.  He presents today for routine electrophysiology followup.  Since last being seen in our clinic, the patient reports doing very well. His energy level is improving post pacemaker implant.  He denies chest pain, palpitations, dyspnea, PND, orthopnea, nausea, vomiting, dizziness, syncope, edema, weight gain, or early satiety.  Device History: MDT dual chamber (HIS Bundle) PPM implanted 2019 for complete heart block    Past Medical History:  Diagnosis Date  . Anemia   . Bradycardia   . CAD (coronary artery disease)   . Chest tightness   . Diabetes mellitus without complication (HCC)   . Dyslipidemia   . Dyspnea on exertion   . Hypertension   . Obesity   . Pneumonia    Past Surgical History:  Procedure Laterality Date  . ACHILLES TENDON SURGERY     for rupture  . CARDIAC CATHETERIZATION  07/08/2009   mild to mod . prox LAD 50% (Dr. Garen LahEichhorn)  . CHOLECYSTECTOMY N/A 10/11/2017   Procedure: LAPAROSCOPIC CHOLECYSTECTOMY WITH INTRAOPERATIVE CHOLANGIOGRAM;  Surgeon: Matthew Sellers, David, MD;  [facility];  Service: General;  Laterality: N/A;  . DOPPLER ECHOCARDIOGRAPHY  04/10/2008   LVEF 70-80% ,norm  . PACEMAKER IMPLANT N/A 10/12/2017   Procedure: PACEMAKER IMPLANT;  Surgeon: Marinus Mawaylor, Gregg W, MD;  [facility];  Service: Cardiovascular;  Laterality: N/A;  . stress myoview  04/09/2008   EF 54%; LV  norm  . TEMPORARY PACEMAKER N/A 10/11/2017   Procedure: TEMPORARY PACEMAKER;  Surgeon: Marinus Mawaylor, Gregg W, MD;  [facility];  Service: Cardiovascular;  Laterality: N/A;    Current Outpatient Medications  Medication Sig Dispense Refill  . amLODipine (NORVASC) 10 MG tablet Take 10 mg by mouth daily.    Marland Kitchen. aspirin EC 81  MG tablet Take 81 mg by mouth daily.    Marland Kitchen. bismuth subsalicylate (PEPTO BISMOL) 262 MG/15ML suspension Take 30 mLs by mouth as needed for indigestion.    . Brinzolamide-Brimonidine 1-0.2 % SUSP Place 1 drop into both eyes 2 (two) times daily.    . fluticasone (FLONASE) 50 MCG/ACT nasal spray Place 2 sprays into both nostrils daily as needed for allergies or rhinitis.    Marland Kitchen. HYDROcodone-acetaminophen (NORCO/VICODIN) 5-325 MG tablet Take 1-2 tablets by mouth every 4 (four) hours as needed for moderate pain. 10 tablet 0  . insulin glargine (LANTUS) 100 UNIT/ML injection Inject 30 Units into the skin at bedtime.     Matthew Sellers. Krill Oil 300 MG CAPS Take 300 mg by mouth daily.    Marland Kitchen. latanoprost (XALATAN) 0.005 % ophthalmic solution Place 1 drop into both eyes at bedtime.    . metFORMIN (GLUCOPHAGE) 1000 MG tablet Take 1,000 mg by mouth 2 (two) times daily with a meal.    . metoCLOPramide (REGLAN) 10 MG tablet Take 1 tablet (10 mg total) by mouth every 6 (six) hours. 30 tablet 0  . nitroGLYCERIN (NITROSTAT) 0.4 MG SL tablet Place 0.4 mg under the tongue every 5 (five) minutes as needed for chest pain.    Marland Kitchen. ondansetron (ZOFRAN ODT) 4 MG disintegrating tablet Take 1 tablet (4 mg total) by mouth every 8 (eight) hours as needed for nausea or vomiting. 20 tablet 0  . pravastatin (PRAVACHOL) 20 MG tablet Take 20 mg  by mouth at bedtime.     No current facility-administered medications for this visit.     Allergies:   Patient has no known allergies.   Social History: Social History   Socioeconomic History  . Marital status: Married    Spouse name: Not on file  . Number of children: Not on file  . Years of education: Not on file  . Highest education level: Not on file  Occupational History  . Occupation: retired  Engineer, production  . Financial resource strain: Not on file  . Food insecurity:    Worry: Not on file    Inability: Not on file  . Transportation needs:    Medical: Not on file    Non-medical: Not on  file  Tobacco Use  . Smoking status: Never Smoker  . Smokeless tobacco: Never Used  Substance and Sexual Activity  . Alcohol use: No    Alcohol/week: 0.0 standard drinks  . Drug use: No  . Sexual activity: Not on file  Lifestyle  . Physical activity:    Days per week: Not on file    Minutes per session: Not on file  . Stress: Not on file  Relationships  . Social connections:    Talks on phone: Not on file    Gets together: Not on file    Attends religious service: Not on file    Active member of club or organization: Not on file    Attends meetings of clubs or organizations: Not on file    Relationship status: Not on file  . Intimate partner violence:    Fear of current or ex partner: Not on file    Emotionally abused: Not on file    Physically abused: Not on file    Forced sexual activity: Not on file  Other Topics Concern  . Not on file  Social History Narrative  . Not on file    Family History: Family History  Problem Relation Age of Onset  . Leukemia Mother      Review of Systems: All other systems reviewed and are otherwise negative except as noted above.   Physical Exam: VS:  BP (!) 150/74   Pulse 74   Ht 5\' 10"  (1.778 m)   Wt 232 lb 3.2 oz (105.3 kg)   SpO2 98%   BMI 33.32 kg/m  , BMI Body mass index is 33.32 kg/m.  GEN- The patient is well appearing, alert and oriented x 3 today.   HEENT: normocephalic, atraumatic; sclera clear, conjunctiva pink; hearing intact; oropharynx clear; neck supple  Lungs- Clear to ausculation bilaterally, normal work of breathing.  No wheezes, rales, rhonchi Heart- Regular rate and rhythm (paced) GI- soft, non-tender, non-distended, bowel sounds present  Extremities- no clubbing, cyanosis, or edema  MS- no significant deformity or atrophy Skin- warm and dry, no rash or lesion; PPM pocket well healed Psych- euthymic mood, full affect Neuro- strength and sensation are intact  PPM Interrogation- reviewed in detail  today,  See PACEART report  EKG:  EKG is ordered today. The ekg ordered today shows sinus rhythm with V pacing  Recent Labs: 10/09/2017: TSH 2.703 10/10/2017: Magnesium 2.0 10/13/2017: ALT 28; BUN 18; Creatinine, Ser 1.36; Hemoglobin 9.5; Platelets 261; Potassium 3.2; Sodium 140   Wt Readings from Last 3 Encounters:  12/01/17 232 lb 3.2 oz (105.3 kg)  10/11/17 240 lb 15.4 oz (109.3 kg)  08/30/16 185 lb (83.9 kg)     Other studies Reviewed: Additional studies/ records that were  reviewed today include: hospital records   Assessment and Plan:  1.  Complete heart block Normal PPM function See Pace Art report No changes today His Bundle capture septal today down to loss of capture  2.  Paroxysmal atrial fibrillation Identified on device interrogation Asymptomatic Longest episode 14 hours CHADS2VASC is at least 4. Discussed with patient today. Episodes were clustered 8/8 and 8/9.  He would like to continue to monitor for 6 weeks at follow up with Dr Ladona Ridgel.  I think he will need anticoagulation long term, he would like to defer for now.    Current medicines are reviewed at length with the patient today.   The patient does not have concerns regarding his medicines.  The following changes were made today:  none  Labs/ tests ordered today include: none No orders of the defined types were placed in this encounter.    Disposition:   Follow up with Dr Ladona Ridgel as scheduled     Signed, Gypsy Balsam, NP 12/01/2017 10:58 AM  [facility] [address] [place] [address] [phone] (office) [phone] (fax)

## 2017-12-01 ENCOUNTER — Ambulatory Visit (INDEPENDENT_AMBULATORY_CARE_PROVIDER_SITE_OTHER): Payer: Medicare Other | Admitting: Nurse Practitioner

## 2017-12-01 ENCOUNTER — Encounter: Payer: Self-pay | Admitting: Nurse Practitioner

## 2017-12-01 VITALS — BP 150/74 | HR 74 | Ht 70.0 in | Wt 232.2 lb

## 2017-12-01 DIAGNOSIS — I480 Paroxysmal atrial fibrillation: Secondary | ICD-10-CM | POA: Diagnosis not present

## 2017-12-01 DIAGNOSIS — I442 Atrioventricular block, complete: Secondary | ICD-10-CM | POA: Diagnosis not present

## 2017-12-01 LAB — CUP PACEART INCLINIC DEVICE CHECK
Date Time Interrogation Session: 20190830110607
Implantable Lead Implant Date: 20190711
Implantable Lead Location: 753859
Implantable Lead Model: 3830
MDC IDC LEAD IMPLANT DT: 20190711
MDC IDC LEAD LOCATION: 753860
MDC IDC PG IMPLANT DT: 20190711

## 2017-12-01 NOTE — Patient Instructions (Signed)
Medication Instructions:   Your physician recommends that you continue on your current medications as directed. Please refer to the Current Medication list given to you today.   If you need a refill on your cardiac medications before your next appointment, please call your pharmacy.  Labwork: NONE ORDERED  TODAY    Testing/Procedures: NONE ORDERED  TODAY   Follow-Up:   AS SCHEDULED   Any Other Special Instructions Will Be Listed Below (If Applicable).                                                                                                                                                   

## 2018-01-16 ENCOUNTER — Emergency Department (HOSPITAL_COMMUNITY)
Admission: EM | Admit: 2018-01-16 | Discharge: 2018-01-16 | Disposition: A | Discharge status: Home / Self Care | Payer: Medicare Other | Attending: Emergency Medicine | Admitting: Emergency Medicine

## 2018-01-16 ENCOUNTER — Encounter (HOSPITAL_COMMUNITY): Payer: Self-pay | Admitting: Emergency Medicine

## 2018-01-16 ENCOUNTER — Emergency Department (HOSPITAL_COMMUNITY): Payer: Medicare Other

## 2018-01-16 DIAGNOSIS — Z794 Long term (current) use of insulin: Secondary | ICD-10-CM | POA: Insufficient documentation

## 2018-01-16 DIAGNOSIS — Z981 Arthrodesis status: Secondary | ICD-10-CM | POA: Diagnosis not present

## 2018-01-16 DIAGNOSIS — M4696 Unspecified inflammatory spondylopathy, lumbar region: Secondary | ICD-10-CM | POA: Insufficient documentation

## 2018-01-16 DIAGNOSIS — E119 Type 2 diabetes mellitus without complications: Secondary | ICD-10-CM | POA: Diagnosis not present

## 2018-01-16 DIAGNOSIS — I129 Hypertensive chronic kidney disease with stage 1 through stage 4 chronic kidney disease, or unspecified chronic kidney disease: Secondary | ICD-10-CM | POA: Diagnosis not present

## 2018-01-16 DIAGNOSIS — Z950 Presence of cardiac pacemaker: Secondary | ICD-10-CM | POA: Insufficient documentation

## 2018-01-16 DIAGNOSIS — Z79899 Other long term (current) drug therapy: Secondary | ICD-10-CM | POA: Insufficient documentation

## 2018-01-16 DIAGNOSIS — I2510 Atherosclerotic heart disease of native coronary artery without angina pectoris: Secondary | ICD-10-CM | POA: Insufficient documentation

## 2018-01-16 DIAGNOSIS — M5431 Sciatica, right side: Secondary | ICD-10-CM | POA: Diagnosis present

## 2018-01-16 DIAGNOSIS — Z7982 Long term (current) use of aspirin: Secondary | ICD-10-CM | POA: Diagnosis not present

## 2018-01-16 DIAGNOSIS — M47816 Spondylosis without myelopathy or radiculopathy, lumbar region: Secondary | ICD-10-CM

## 2018-01-16 DIAGNOSIS — N183 Chronic kidney disease, stage 3 (moderate): Secondary | ICD-10-CM | POA: Insufficient documentation

## 2018-01-16 MED ORDER — LIDOCAINE 5 % EX PTCH: 1.0000 | MEDICATED_PATCH | CUTANEOUS | 0 refills | Status: AC

## 2018-01-16 MED ORDER — DEXAMETHASONE SODIUM PHOSPHATE 10 MG/ML IJ SOLN
10.0000 mg | Freq: Once | INTRAMUSCULAR | Status: AC
  Administered 2018-01-16: 10 mg via INTRAMUSCULAR
  Filled 2018-01-16: qty 1

## 2018-01-16 MED ORDER — HYDROCODONE-ACETAMINOPHEN 5-325 MG PO TABS
1.0000 | ORAL_TABLET | Freq: Once | ORAL | Status: AC
  Administered 2018-01-16: 1 via ORAL
  Filled 2018-01-16: qty 1

## 2018-01-16 MED ORDER — TRAMADOL HCL 50 MG PO TABS: ORAL_TABLET | ORAL | 0 refills | Status: DC

## 2018-01-16 NOTE — Discharge Instructions (Signed)
Thank you for allowing me to care for you today in the Emergency Department.   Call to schedule follow-up appointment with [facility] neurosurgery.  The office information is listed above.  For mild to moderate pain, take Tylenol arthritis as directed on the label for pain control.  For severe pain, take 25 mg or 50 mg (1/2 tablet or 1 whole tablet) every 6 hours for pain control.  Please note that tramadol is a controlled substance and can be addicting so use cautiously.  Do not use this medication before you drive because it can cause you to be impaired.   You can apply 1 lidocaine patch to the skin every 12 hours as needed for pain control.  This is safe to use with the other medications listed above.  Return to the emergency department if you develop new or worsening symptoms including if you start having episodes where you pee or poop on yourself, have numbness around your groin or around your rectum, develop a high fever, severe belly pain, or other new, concerning symptoms.

## 2018-01-16 NOTE — ED Triage Notes (Signed)
Patient to ED c/o pain in R buttock that radiates down his R leg - has been treated at the [place] with medications but states that they didn't help. He denies any known back injuries. States he can't walk without crutches d/t the pain.

## 2018-01-16 NOTE — ED Provider Notes (Signed)
[facility] EMERGENCY DEPARTMENT Provider Note   CSN: [id number] Arrival date & time: 01/16/18  1430     History   Chief Complaint Chief Complaint  Patient presents with  . Sciatica    HPI Matthew Sellers is a 77 y.o. male with a history of CAD, complete heart block s/p pacemaker, diabetes mellitus type 2, HTN, HLD, and PAD who presents to the emergency department with a chief complaint of back pain.  Patient reports atraumatic, constant back pain that radiates down the right buttock, thigh, into the superior portion of the right lower leg that has been progressively worsening since July 2019.  No known trauma or injury.  Patient reports that he had a cholecystectomy and pacemaker placed in July 2019.  At the end of his hospitalization, he states that he was weak due to muscle atrophy and completed a course of in-home physical therapy.  He states that he developed mild pain while he was completing the course of physical therapy that has gradually worsened over the last few months.  He also endorses worsening weakness in the right leg.  He characterizes the pain as burning.  Pain is worse with standing, walking, and other positional changes.  He states that when he sits the pain radiates from his right side to his left side of his low back.  He denies left leg numbness or weakness, urinary or fecal incontinence, saddle paresthesias, abdominal pain, fevers, chills, chest pain,, dyspnea, nausea, vomiting, or diarrhea.  He has been evaluated by his primary care provider and has tried hot and cold compresses, ibuprofen, Flexeril, and prednisone without relief.  He states that he was seen by Dr. Lilian Kapur with neurology at the [facility] and was advised to follow back up with his primary care provider.  He states that he had an x-ray performed that did not show osteoarthritis and neurology was concerned that it was his sciatic nerve.   No history of back injuries or surgeries.  He  states that he has now been using crutches for the last 3 weeks because he is unable to walk without them due to the pain.  He states that he has approximately 12-15 steps into his home.  States that he is now starting to have some pain in his left knee from putting more weight on his left leg to try and avoid the pain in his right leg.  The history is provided by the patient. No language interpreter was used.    Past Medical History:  Diagnosis Date  . Anemia   . Bradycardia   . CAD (coronary artery disease)   . Chest tightness   . Diabetes mellitus without complication (HCC)   . Dyslipidemia   . Dyspnea on exertion   . Hypertension   . Obesity   . Paroxysmal atrial fibrillation (HCC)    a. identified on device interrogation  . Pneumonia     Patient Active Problem List   Diagnosis Date Noted  . Complete heart block (HCC)   . Abdominal pain 10/09/2017  . Fatty liver 10/09/2017  . CKD (chronic kidney disease) stage 3, GFR 30-59 ml/min (HCC) 10/09/2017  . Bradycardia 10/09/2017  . CAP (community acquired pneumonia) 06/29/2014  . Diabetes (HCC) 06/29/2014  . Hypertension 06/29/2014  . Hyperlipidemia 06/29/2014  . Hypokalemia 06/29/2014  . Anemia 06/29/2014  . Pneumonia 06/29/2014    Past Surgical History:  Procedure Laterality Date  . ACHILLES TENDON SURGERY     for rupture  .  CARDIAC CATHETERIZATION  07/08/2009   mild to mod . prox LAD 50% (Dr. Garen Lah)  . CHOLECYSTECTOMY N/A 10/11/2017   Procedure: LAPAROSCOPIC CHOLECYSTECTOMY WITH INTRAOPERATIVE CHOLANGIOGRAM;  Surgeon: Ovidio Kin, MD;  Location: [facility] OR;  Service: General;  Laterality: N/A;  . DOPPLER ECHOCARDIOGRAPHY  04/10/2008   LVEF 70-80% ,norm  . PACEMAKER IMPLANT N/A 10/12/2017   Procedure: PACEMAKER IMPLANT;  Surgeon: Marinus Maw, MD;  Location[facility] INVASIVE CV LAB;  Service: Cardiovascular;  Laterality: N/A;  . stress myoview  04/09/2008   EF 54%; LV  norm  . TEMPORARY PACEMAKER N/A 10/11/2017    Procedure: TEMPORARY PACEMAKER;  Surgeon: Marinus Maw, MD;  Location: [facility] INVASIVE CV LAB;  Service: Cardiovascular;  Laterality: N/A;        Home Medications    Prior to Admission medications   Medication Sig Start Date End Date Taking? Authorizing Provider  amLODipine (NORVASC) 10 MG tablet Take 10 mg by mouth daily.    [provider]  aspirin EC 81 MG tablet Take 81 mg by mouth daily.    [provider]  bismuth subsalicylate (PEPTO BISMOL) 262 MG/15ML suspension Take 30 mLs by mouth as needed for indigestion.    [provider]  Brinzolamide-Brimonidine 1-0.2 % SUSP Place 1 drop into both eyes 2 (two) times daily.    [provider]  fluticasone (FLONASE) 50 MCG/ACT nasal spray Place 2 sprays into both nostrils daily as needed for allergies or rhinitis.    [provider]  HYDROcodone-acetaminophen (NORCO/VICODIN) 5-325 MG tablet Take 1-2 tablets by mouth every 4 (four) hours as needed for moderate pain. 10/14/17   Leatha Gilding, MD  insulin glargine (LANTUS) 100 UNIT/ML injection Inject 30 Units into the skin at bedtime.     [provider]  Boris Lown Oil 300 MG CAPS Take 300 mg by mouth daily.    [provider]  latanoprost (XALATAN) 0.005 % ophthalmic solution Place 1 drop into both eyes at bedtime.    [provider]  lidocaine (LIDODERM) 5 % Place 1 patch onto the skin daily. Remove & Discard patch within 12 hours or as directed by MD 01/16/18   Towana Stenglein A, PA-C  metFORMIN (GLUCOPHAGE) 1000 MG tablet Take 1,000 mg by mouth 2 (two) times daily with a meal.    [provider]  metoCLOPramide (REGLAN) 10 MG tablet Take 1 tablet (10 mg total) by mouth every 6 (six) hours. 10/08/17   Elpidio Anis, PA-C  nitroGLYCERIN (NITROSTAT) 0.4 MG SL tablet Place 0.4 mg under the tongue every 5 (five) minutes as needed for chest pain.    [provider]  ondansetron (ZOFRAN ODT) 4 MG disintegrating  tablet Take 1 tablet (4 mg total) by mouth every 8 (eight) hours as needed for nausea or vomiting. 04/17/16   Melene Plan, DO  pravastatin (PRAVACHOL) 20 MG tablet Take 20 mg by mouth at bedtime.    [provider]  traMADol (ULTRAM) 50 MG tablet Take 25mg  (1/2 tablet) to 50 mg (1 tablet) every 6 hours as needed for severe pain. 01/16/18   Emery Dupuy, Coral Else, PA-C    Family History Family History  Problem Relation Age of Onset  . Leukemia Mother     Social History Social History   Tobacco Use  . Smoking status: Never Smoker  . Smokeless tobacco: Never Used  Substance Use Topics  . Alcohol use: No    Alcohol/week: 0.0 standard drinks  . Drug use: No  Allergies   Patient has no known allergies.   Review of Systems Review of Systems  Constitutional: Negative for activity change, chills and fever.  Respiratory: Negative for shortness of breath.   Cardiovascular: Negative for chest pain.  Gastrointestinal: Negative for abdominal pain, diarrhea, nausea and vomiting.  Genitourinary: Negative for dysuria and urgency.  Musculoskeletal: Positive for arthralgias, back pain, gait problem and myalgias. Negative for neck pain and neck stiffness.  Skin: Negative for rash.  Neurological: Positive for weakness and numbness.     Physical Exam Updated Vital Signs BP 134/76 (BP Location: Right Arm)   Pulse 83   Temp 97.8 F (36.6 C) (Oral)   Resp 16   SpO2 100%   Physical Exam  Constitutional: He appears well-developed.  HENT:  Head: Normocephalic.  Eyes: Conjunctivae are normal.  Neck: Neck supple.  Cardiovascular: Normal rate and regular rhythm.  No murmur heard. Pulmonary/Chest: Effort normal and breath sounds normal. No stridor. No respiratory distress. He has no wheezes. He has no rales. He exhibits no tenderness.  Abdominal: Soft. He exhibits no distension and no mass. There is no tenderness. There is no rebound and no guarding. No hernia.  Obese abdomen.  Abdomen  is soft, nontender, nondistended.  No CVA tenderness bilaterally.  Musculoskeletal:  No tenderness to the spinous processes of the cervical or thoracic spine or bilateral paraspinal muscles.  He is tender to palpation to the spinous processes of the lumbar spine.  No reproducible pain over the SI joints bilaterally.  Positive straight leg raise at 35 degrees on the right.  He also appears uncomfortable with straight leg raise on the left, but it does not reproduce his pain.  No tenderness to the right hip, knee, or ankle.  DP and PT pulses are 2+ and symmetric.  5 out of 5 strength against resistance with the large muscle groups of the bilateral lower extremities and with dorsiflexion plantarflexion.  His sensation appears intact and symmetric with soft and sharp touch throughout multiple distributions of the bilateral lower extremities.  He is able to bear weight on the bilateral lower extremities and can easily lift the left leg with gait.  He can lift and stop with the right lower extremity, but his gait is slow, appears painful, and requires assistance.   Neurological: He is alert.  Skin: Skin is warm and dry.  Psychiatric: His behavior is normal.  Nursing note and vitals reviewed.    ED Treatments / Results  Labs (all labs ordered are listed, but only abnormal results are displayed) Labs Reviewed - No data to display  EKG None  Radiology Dg Lumbar Spine Complete  Result Date: 01/16/2018 CLINICAL DATA:  Shooting pains from the right lumbar spine down right leg x3 weeks. EXAM: LUMBAR SPINE - COMPLETE 4+ VIEW COMPARISON:  None. FINDINGS: There is no evidence of lumbar spine fracture. Alignment is normal. Mild facet arthropathy L1 through S1 greatest at L5-S1. Intervertebral disc spaces are maintained. Mild aortic atherosclerosis without definite aneurysm. Oblique views demonstrate no significant osseous stenosis. IMPRESSION: Lumbar facet arthrosis. No acute lumbar spine fracture or  significant disc flattening. Electronically Signed   By: Tollie Eth M.D.   On: 01/16/2018 19:05    Procedures Procedures (including critical care time)  Medications Ordered in ED Medications  dexamethasone (DECADRON) injection 10 mg (has no administration in time range)  HYDROcodone-acetaminophen (NORCO/VICODIN) 5-325 MG per tablet 1 tablet (1 tablet Oral Given 01/16/18 1503)     Initial Impression / Assessment and  Plan / ED Course  I have reviewed the triage vital signs and the nursing notes.  Pertinent labs & imaging results that were available during my care of the patient were reviewed by me and considered in my medical decision making (see chart for details).     77 year old male with a history of CAD, complete heart block s/p pacemaker, diabetes mellitus type 2, HTN, HLD, and PAD with progressively worsening low back pain that radiates down the right leg for the last 5 months.  Pain is been progressively worsening since onset.  No red flags including urinary or fecal incontinence, saddle paresthesias, constitutional symptoms including fever or chills, GI, or GU symptoms.  The patient was discussed and independently evaluated by Dr. Clayborne Dana, attending physician.  The patient is mostly here today for continued pain.  Symptoms have not significantly worsened in the last few weeks; however, his pain has been gradually worsening since onset. No neurological deficits and normal neuro exam.  Patient can walk but states it is painful. No concern for cauda equina, infection, AAA, infection, or fracture.  No constitutional symptoms including fever, chills, or weight loss,  No h/o cancer, IVDU.  Will discharge to home with surgery follow-up, tramadol, and lidocaine patches.  A 48-month prescription history query was performed using the [place] CSRS prior to discharge. Decadron injection given in the ED.  Discussed ED return precaution and indications for PCP follow up. The patient acknowledges the plan and  is agreeable at this time.    Final Clinical Impressions(s) / ED Diagnoses   Final diagnoses:  Lumbar facet arthropathy    ED Discharge Orders         Ordered    lidocaine (LIDODERM) 5 %  Every 24 hours     01/16/18 1945    traMADol (ULTRAM) 50 MG tablet     01/16/18 1945           Willem Klingensmith, Coral Else, PA-C 01/16/18 1952    Mesner, Barbara Cower, MD 01/17/18 0009

## 2018-01-16 NOTE — ED Notes (Signed)
Patient transported to X-ray 

## 2018-01-16 NOTE — ED Notes (Signed)
ED Provider at bedside. 

## 2018-01-16 NOTE — ED Provider Notes (Signed)
Patient placed in Quick Look pathway, seen and evaluated   Chief Complaint: back pain   HPI:   Pt with right lower back pain that radiates into buttock and right leg. Diagnosed at [facility] with sciatica. Placed on ibuprofen 600mg  and flexeril states not helping at all. Reports numbness and tingling to right foot. No weakness. No fever or chills. No abd pain. No urinary symptoms. No injuries. No trouble controlling bladder or bowels.   ROS:  Back pain, tingling  Physical Exam:   Gen: No distress  Neuro: Awake and Alert  Skin: Warm    Focused Exam: pain with right straight leg raise. No obvious swelling or discoloration to RLE.   Patient with lumbosacral radiculopathy/sciatica for 3 weeks.  He is requesting imaging.  I had a discussion with him that he does not have any emergent symptoms or signs that would prompt [place] to order an MRI at this time.  Most insurances would not cover advanced imaging unless symptoms have been going on for at least 6 weeks and all symptomatic treatment has been tried.  Patient screened by me, he will be seen by different provider.  I ordered him a Vicodin for pain.  Vitals:   01/16/18 1435  BP: 134/76  Pulse: 83  Resp: 16  Temp: 97.8 F (36.6 C)  TempSrc: Oral  SpO2: 100%      Initiation of care has begun. The patient has been counseled on the process, plan, and necessity for staying for the completion/evaluation, and the remainder of the medical screening examination    Jaynie Crumble, PA-C 01/16/18 1500    Benjiman Core, MD 01/16/18 1535

## 2018-01-24 ENCOUNTER — Encounter: Payer: Medicare Other | Admitting: Internal Medicine

## 2018-01-26 DIAGNOSIS — Z6834 Body mass index (BMI) 34.0-34.9, adult: Secondary | ICD-10-CM | POA: Diagnosis not present

## 2018-01-26 DIAGNOSIS — I10 Essential (primary) hypertension: Secondary | ICD-10-CM | POA: Diagnosis not present

## 2018-01-26 DIAGNOSIS — M549 Dorsalgia, unspecified: Secondary | ICD-10-CM | POA: Diagnosis not present

## 2018-01-29 DIAGNOSIS — Z794 Long term (current) use of insulin: Secondary | ICD-10-CM | POA: Diagnosis not present

## 2018-01-29 DIAGNOSIS — M7061 Trochanteric bursitis, right hip: Secondary | ICD-10-CM | POA: Diagnosis not present

## 2018-01-29 DIAGNOSIS — M47816 Spondylosis without myelopathy or radiculopathy, lumbar region: Secondary | ICD-10-CM | POA: Diagnosis not present

## 2018-01-29 DIAGNOSIS — M7062 Trochanteric bursitis, left hip: Secondary | ICD-10-CM | POA: Diagnosis not present

## 2018-01-29 DIAGNOSIS — E119 Type 2 diabetes mellitus without complications: Secondary | ICD-10-CM | POA: Diagnosis not present

## 2018-02-01 ENCOUNTER — Ambulatory Visit (INDEPENDENT_AMBULATORY_CARE_PROVIDER_SITE_OTHER): Payer: Medicare Other | Admitting: Internal Medicine

## 2018-02-01 ENCOUNTER — Encounter: Payer: Self-pay | Admitting: Internal Medicine

## 2018-02-01 VITALS — BP 140/76 | HR 110 | Ht 70.0 in | Wt 229.2 lb

## 2018-02-01 DIAGNOSIS — I442 Atrioventricular block, complete: Secondary | ICD-10-CM

## 2018-02-01 DIAGNOSIS — I480 Paroxysmal atrial fibrillation: Secondary | ICD-10-CM

## 2018-02-01 NOTE — Progress Notes (Signed)
HPI Matthew Sellers returns today for PPM followup for CHB. He is a pleasant 77 yo man who developed CHB in conjunction with cholithiasis and underwent temporary PM, cholecystectomy and PPM over the course of 3 days. He was discharged home and returns today for followup. He has not had syncope. He admits to feeling well and has had some dietary indiscretion. No edema. No chest pain.  No Known Allergies   Current Outpatient Medications  Medication Sig Dispense Refill  . amLODipine (NORVASC) 10 MG tablet Take 10 mg by mouth daily.    Marland Kitchen aspirin EC 81 MG tablet Take 81 mg by mouth daily.    . Brinzolamide-Brimonidine 1-0.2 % SUSP Place 1 drop into both eyes 2 (two) times daily.    . fluticasone (FLONASE) 50 MCG/ACT nasal spray Place 2 sprays into both nostrils daily as needed for allergies or rhinitis.    Marland Kitchen gabapentin (NEURONTIN) 100 MG capsule Take 1 capsule by mouth 3 (three) times daily.  2  . HYDROcodone-acetaminophen (NORCO/VICODIN) 5-325 MG tablet Take 1-2 tablets by mouth every 4 (four) hours as needed for moderate pain. 10 tablet 0  . insulin glargine (LANTUS) 100 UNIT/ML injection Inject 30 Units into the skin at bedtime.     Boris Lown Oil 300 MG CAPS Take 300 mg by mouth daily.    Marland Kitchen latanoprost (XALATAN) 0.005 % ophthalmic solution Place 1 drop into both eyes at bedtime.    . lidocaine (LIDODERM) 5 % Place 1 patch onto the skin daily. Remove & Discard patch within 12 hours or as directed by MD 30 patch 0  . metFORMIN (GLUCOPHAGE) 1000 MG tablet Take 1,000 mg by mouth 2 (two) times daily with a meal.    . nitroGLYCERIN (NITROSTAT) 0.4 MG SL tablet Place 0.4 mg under the tongue every 5 (five) minutes as needed for chest pain.    . pravastatin (PRAVACHOL) 20 MG tablet Take 20 mg by mouth at bedtime.     No current facility-administered medications for this visit.      Past Medical History:  Diagnosis Date  . Anemia   . Bradycardia   . CAD (coronary artery disease)   . Chest  tightness   . Diabetes mellitus without complication (HCC)   . Dyslipidemia   . Dyspnea on exertion   . Hypertension   . Obesity   . Paroxysmal atrial fibrillation (HCC)    a. identified on device interrogation  . Pneumonia     ROS:   All systems reviewed and negative except as noted in the HPI.   Past Surgical History:  Procedure Laterality Date  . ACHILLES TENDON SURGERY     for rupture  . CARDIAC CATHETERIZATION  07/08/2009   mild to mod . prox LAD 50% (Dr. Garen Lah)  . CHOLECYSTECTOMY N/A 10/11/2017   Procedure: LAPAROSCOPIC CHOLECYSTECTOMY WITH INTRAOPERATIVE CHOLANGIOGRAM;  Surgeon: Ovidio Kin, MD;  Location: [facility] OR;  Service: General;  Laterality: N/A;  . DOPPLER ECHOCARDIOGRAPHY  04/10/2008   LVEF 70-80% ,norm  . PACEMAKER IMPLANT N/A 10/12/2017   Procedure: PACEMAKER IMPLANT;  Surgeon: Marinus Maw, MD;  Location: [facility] INVASIVE CV LAB;  Service: Cardiovascular;  Laterality: N/A;  . stress myoview  04/09/2008   EF 54%; LV  norm  . TEMPORARY PACEMAKER N/A 10/11/2017   Procedure: TEMPORARY PACEMAKER;  Surgeon: Marinus Maw, MD;  Location: [facility] INVASIVE CV LAB;  Service: Cardiovascular;  Laterality: N/A;     Family History  Problem Relation Age of  Onset  . Leukemia Mother      Social History   Socioeconomic History  . Marital status: Married    Spouse name: Not on file  . Number of children: Not on file  . Years of education: Not on file  . Highest education level: Not on file  Occupational History  . Occupation: retired  Engineer, production  . Financial resource strain: Not on file  . Food insecurity:    Worry: Not on file    Inability: Not on file  . Transportation needs:    Medical: Not on file    Non-medical: Not on file  Tobacco Use  . Smoking status: Never Smoker  . Smokeless tobacco: Never Used  Substance and Sexual Activity  . Alcohol use: No    Alcohol/week: 0.0 standard drinks  . Drug use: No  . Sexual activity: Not on file  Lifestyle  .  Physical activity:    Days per week: Not on file    Minutes per session: Not on file  . Stress: Not on file  Relationships  . Social connections:    Talks on phone: Not on file    Gets together: Not on file    Attends religious service: Not on file    Active member of club or organization: Not on file    Attends meetings of clubs or organizations: Not on file    Relationship status: Not on file  . Intimate partner violence:    Fear of current or ex partner: Not on file    Emotionally abused: Not on file    Physically abused: Not on file    Forced sexual activity: Not on file  Other Topics Concern  . Not on file  Social History Narrative  . Not on file     BP 140/76   Pulse (!) 110   Ht 5\' 10"  (1.778 m)   Wt 229 lb 3.2 oz (104 kg)   SpO2 98%   BMI 32.89 kg/m   Physical Exam:  Well appearing NAD HEENT: Unremarkable Neck:  No JVD, no thyromegally Lymphatics:  No adenopathy Back:  No CVA tenderness Lungs:  Clear with no wheezes HEART:  Regular rate rhythm, no murmurs, no rubs, no clicks Abd:  soft, positive bowel sounds, no organomegally, no rebound, no guarding Ext:  2 plus pulses, no edema, no cyanosis, no clubbing Skin:  No rashes no nodules Neuro:  CN II through XII intact, motor grossly intact  EKG - nsr with p synchronous ventricular pacing  DEVICE  Normal device function.  See PaceArt for details.   Assess/Plan: 1. CHB- he is s/p PPM insertion and doing well. 2. PPM- his medtronic device is working normally. 3. Obesity - he has gained some weight and I have encouraged him to lose.  Leonia Reeves.D.

## 2018-02-01 NOTE — Patient Instructions (Signed)
Medication Instructions:  Your physician recommends that you continue on your current medications as directed. Please refer to the Current Medication list given to you today.  If you need a refill on your cardiac medications before your next appointment, please call your pharmacy.   Lab work: None ordered.  If you have labs (blood work) drawn today and your tests are completely normal, you will receive your results only by: . MyChart Message (if you have MyChart) OR . A paper copy in the mail If you have any lab test that is abnormal or we need to change your treatment, we will call you to review the results.  Testing/Procedures: None ordered.  Follow-Up:  Your physician wants you to follow-up in: 9 months with Dr. Taylor.   You will receive a reminder letter in the mail two months in advance. If you don't receive a letter, please call our office to schedule the follow-up appointment.  Remote monitoring is used to monitor your Pacemaker from home. This monitoring reduces the number of office visits required to check your device to one time per year. It allows us to keep an eye on the functioning of your device to ensure it is working properly. You are scheduled for a device check from home on 05/03/2018. You may send your transmission at any time that day. If you have a wireless device, the transmission will be sent automatically. After your physician reviews your transmission, you will receive a postcard with your next transmission date.  At [facility], you and your health needs are our priority.  As part of our continuing mission to provide you with exceptional heart care, we have created designated Provider Care Teams.  These Care Teams include your primary Cardiologist (physician) and Advanced Practice Providers (APPs -  Physician Assistants and Nurse Practitioners) who all work together to provide you with the care you need, when you need it. . You may see Gregg Taylor, MD or one of  the following Advanced Practice Providers on your designated Care Team:   . Amber Seiler, NP . Renee Ursuy, PA-C   Any Other Special Instructions Will Be Listed Below (If Applicable). 

## 2018-02-05 NOTE — Addendum Note (Signed)
Addended by: Lajoyce Corners on: 02/05/2018 08:39 AM   Modules accepted: Orders

## 2018-05-03 ENCOUNTER — Ambulatory Visit (INDEPENDENT_AMBULATORY_CARE_PROVIDER_SITE_OTHER): Payer: Medicare Other

## 2018-05-03 DIAGNOSIS — I442 Atrioventricular block, complete: Secondary | ICD-10-CM

## 2018-05-04 LAB — CUP PACEART REMOTE DEVICE CHECK
Battery Remaining Longevity: 125 mo
Battery Voltage: 3.02 V
Brady Statistic AS VP Percent: 84.82 %
Brady Statistic RA Percent Paced: 14.09 %
Implantable Lead Implant Date: 20190711
Implantable Lead Location: 753859
Implantable Lead Model: 3830
Implantable Pulse Generator Implant Date: 20190711
Lead Channel Impedance Value: 513 Ohm
Lead Channel Pacing Threshold Amplitude: 1 V
Lead Channel Pacing Threshold Pulse Width: 0.4 ms
Lead Channel Sensing Intrinsic Amplitude: 2.875 mV
Lead Channel Sensing Intrinsic Amplitude: 9.375 mV
Lead Channel Setting Pacing Amplitude: 2 V
Lead Channel Setting Pacing Pulse Width: 0.5 ms
Lead Channel Setting Sensing Sensitivity: 1.2 mV
MDC IDC LEAD IMPLANT DT: 20190711
MDC IDC LEAD LOCATION: 753860
MDC IDC MSMT LEADCHNL RA IMPEDANCE VALUE: 304 Ohm
MDC IDC MSMT LEADCHNL RA IMPEDANCE VALUE: 532 Ohm
MDC IDC MSMT LEADCHNL RA SENSING INTR AMPL: 2.875 mV
MDC IDC MSMT LEADCHNL RV IMPEDANCE VALUE: 418 Ohm
MDC IDC MSMT LEADCHNL RV PACING THRESHOLD AMPLITUDE: 0.5 V
MDC IDC MSMT LEADCHNL RV PACING THRESHOLD PULSEWIDTH: 0.4 ms
MDC IDC MSMT LEADCHNL RV SENSING INTR AMPL: 9.375 mV
MDC IDC SESS DTM: 20200130042924
MDC IDC SET LEADCHNL RV PACING AMPLITUDE: 2.5 V
MDC IDC STAT BRADY AP VP PERCENT: 13.7 %
MDC IDC STAT BRADY AP VS PERCENT: 0 %
MDC IDC STAT BRADY AS VS PERCENT: 1.49 %
MDC IDC STAT BRADY RV PERCENT PACED: 98.51 %

## 2018-05-10 DIAGNOSIS — E113392 Type 2 diabetes mellitus with moderate nonproliferative diabetic retinopathy without macular edema, left eye: Secondary | ICD-10-CM | POA: Diagnosis not present

## 2018-05-10 DIAGNOSIS — H16223 Keratoconjunctivitis sicca, not specified as Sjogren's, bilateral: Secondary | ICD-10-CM | POA: Diagnosis not present

## 2018-05-10 DIAGNOSIS — H25813 Combined forms of age-related cataract, bilateral: Secondary | ICD-10-CM | POA: Diagnosis not present

## 2018-05-10 DIAGNOSIS — H401132 Primary open-angle glaucoma, bilateral, moderate stage: Secondary | ICD-10-CM | POA: Diagnosis not present

## 2018-05-11 NOTE — Progress Notes (Signed)
Remote pacemaker transmission.   

## 2018-05-14 ENCOUNTER — Encounter: Payer: Self-pay | Admitting: Cardiology

## 2018-05-15 DIAGNOSIS — H2513 Age-related nuclear cataract, bilateral: Secondary | ICD-10-CM | POA: Diagnosis not present

## 2018-05-15 DIAGNOSIS — E113392 Type 2 diabetes mellitus with moderate nonproliferative diabetic retinopathy without macular edema, left eye: Secondary | ICD-10-CM | POA: Diagnosis not present

## 2018-05-15 DIAGNOSIS — E113391 Type 2 diabetes mellitus with moderate nonproliferative diabetic retinopathy without macular edema, right eye: Secondary | ICD-10-CM | POA: Diagnosis not present

## 2018-05-15 DIAGNOSIS — H43811 Vitreous degeneration, right eye: Secondary | ICD-10-CM | POA: Diagnosis not present

## 2018-05-23 DIAGNOSIS — H16223 Keratoconjunctivitis sicca, not specified as Sjogren's, bilateral: Secondary | ICD-10-CM | POA: Diagnosis not present

## 2018-05-23 DIAGNOSIS — E113392 Type 2 diabetes mellitus with moderate nonproliferative diabetic retinopathy without macular edema, left eye: Secondary | ICD-10-CM | POA: Diagnosis not present

## 2018-05-23 DIAGNOSIS — H401132 Primary open-angle glaucoma, bilateral, moderate stage: Secondary | ICD-10-CM | POA: Diagnosis not present

## 2018-05-23 DIAGNOSIS — H25813 Combined forms of age-related cataract, bilateral: Secondary | ICD-10-CM | POA: Diagnosis not present

## 2018-08-02 ENCOUNTER — Other Ambulatory Visit: Payer: Self-pay

## 2018-08-02 ENCOUNTER — Ambulatory Visit (INDEPENDENT_AMBULATORY_CARE_PROVIDER_SITE_OTHER): Payer: Medicare Other | Admitting: *Deleted

## 2018-08-02 DIAGNOSIS — I442 Atrioventricular block, complete: Secondary | ICD-10-CM

## 2018-08-02 LAB — CUP PACEART REMOTE DEVICE CHECK
Battery Remaining Longevity: 121 mo
Battery Voltage: 3.01 V
Brady Statistic AP VP Percent: 13.14 %
Brady Statistic AP VS Percent: 0 %
Brady Statistic AS VP Percent: 85.54 %
Brady Statistic AS VS Percent: 1.33 %
Brady Statistic RA Percent Paced: 13.54 %
Brady Statistic RV Percent Paced: 98.67 %
Date Time Interrogation Session: 20200430043602
Implantable Lead Implant Date: 20190711
Implantable Lead Implant Date: 20190711
Implantable Lead Location: 753859
Implantable Lead Location: 753860
Implantable Lead Model: 3830
Implantable Lead Model: 5076
Implantable Pulse Generator Implant Date: 20190711
Lead Channel Impedance Value: 304 Ohm
Lead Channel Impedance Value: 418 Ohm
Lead Channel Impedance Value: 456 Ohm
Lead Channel Impedance Value: 513 Ohm
Lead Channel Pacing Threshold Amplitude: 0.625 V
Lead Channel Pacing Threshold Amplitude: 1 V
Lead Channel Pacing Threshold Pulse Width: 0.4 ms
Lead Channel Pacing Threshold Pulse Width: 0.4 ms
Lead Channel Sensing Intrinsic Amplitude: 17.625 mV
Lead Channel Sensing Intrinsic Amplitude: 2.75 mV
Lead Channel Setting Pacing Amplitude: 2.25 V
Lead Channel Setting Pacing Amplitude: 2.5 V
Lead Channel Setting Pacing Pulse Width: 0.5 ms
Lead Channel Setting Sensing Sensitivity: 1.2 mV

## 2018-08-10 ENCOUNTER — Encounter: Payer: Self-pay | Admitting: Cardiology

## 2018-08-10 NOTE — Progress Notes (Signed)
Remote pacemaker transmission.   

## 2018-10-29 ENCOUNTER — Telehealth: Payer: Self-pay

## 2018-10-29 NOTE — Telephone Encounter (Signed)
Spoke with pt regarding covid-19 screening prior to appt. Pt stated he has not been in contact with anyone who may have covid-19 and has no symptoms. 

## 2018-10-30 ENCOUNTER — Other Ambulatory Visit: Payer: Self-pay

## 2018-10-30 ENCOUNTER — Encounter: Payer: Self-pay | Admitting: Internal Medicine

## 2018-10-30 ENCOUNTER — Encounter (INDEPENDENT_AMBULATORY_CARE_PROVIDER_SITE_OTHER): Payer: Self-pay

## 2018-10-30 ENCOUNTER — Ambulatory Visit (INDEPENDENT_AMBULATORY_CARE_PROVIDER_SITE_OTHER): Payer: Medicare Other | Admitting: Internal Medicine

## 2018-10-30 VITALS — BP 132/70 | HR 101 | Ht 70.0 in | Wt 223.0 lb

## 2018-10-30 DIAGNOSIS — Z950 Presence of cardiac pacemaker: Secondary | ICD-10-CM | POA: Diagnosis not present

## 2018-10-30 DIAGNOSIS — I442 Atrioventricular block, complete: Secondary | ICD-10-CM

## 2018-10-30 DIAGNOSIS — I480 Paroxysmal atrial fibrillation: Secondary | ICD-10-CM | POA: Insufficient documentation

## 2018-10-30 NOTE — Patient Instructions (Signed)
Medication Instructions:  Your physician recommends that you continue on your current medications as directed. Please refer to the Current Medication list given to you today.  Labwork: None ordered.  Testing/Procedures: None ordered.  Follow-Up: Your physician wants you to follow-up in: one year with Dr. Lovena Le.   You will receive a reminder letter in the mail two months in advance. If you don't receive a letter, please call our office to schedule the follow-up appointment.  Remote monitoring is used to monitor your Pacemaker from home. This monitoring reduces the number of office visits required to check your device to one time per year. It allows [place] to keep an eye on the functioning of your device to ensure it is working properly. You are scheduled for a device check from home on 11/01/2018. You may send your transmission at any time that day. If you have a wireless device, the transmission will be sent automatically. After your physician reviews your transmission, you will receive a postcard with your next transmission date.  Any Other Special Instructions Will Be Listed Below (If Applicable).  If you need a refill on your cardiac medications before your next appointment, please call your pharmacy.

## 2018-10-30 NOTE — Progress Notes (Signed)
HPI [name] returns today for PPM followup for CHB. He is a pleasant 78 yo man who developed CHB in conjunction with cholithiasis and underwent temporary PM, cholecystectomy and PPM over the course of 3 days one year ago. He has not had syncope. He admits to feeling well and has had some dietary indiscretion. No edema. No chest pain.  No Known Allergies   Current Outpatient Medications  Medication Sig Dispense Refill  . amLODipine (NORVASC) 10 MG tablet Take 10 mg by mouth daily.    Marland Kitchen. aspirin EC 81 MG tablet Take 81 mg by mouth daily.    . Brinzolamide-Brimonidine 1-0.2 % SUSP Place 1 drop into both eyes 2 (two) times daily.    . fluticasone (FLONASE) 50 MCG/ACT nasal spray Place 2 sprays into both nostrils daily as needed for allergies or rhinitis.    Marland Kitchen. gabapentin (NEURONTIN) 100 MG capsule Take 1 capsule by mouth 3 (three) times daily.  2  . HYDROcodone-acetaminophen (NORCO/VICODIN) 5-325 MG tablet Take 1-2 tablets by mouth every 4 (four) hours as needed for moderate pain. 10 tablet 0  . insulin glargine (LANTUS) 100 UNIT/ML injection Inject 30 Units into the skin at bedtime.     Marland Kitchen. JARDIANCE 25 MG TABS tablet Take 1 tablet by mouth daily.    Boris Lown. Krill Oil 300 MG CAPS Take 300 mg by mouth daily.    Marland Kitchen. latanoprost (XALATAN) 0.005 % ophthalmic solution Place 1 drop into both eyes at bedtime.    . lidocaine (LIDODERM) 5 % Place 1 patch onto the skin daily. Remove & Discard patch within 12 hours or as directed by MD 30 patch 0  . lisinopril (ZESTRIL) 20 MG tablet Take 2 tablets by mouth daily.    . metFORMIN (GLUCOPHAGE) 1000 MG tablet Take 1,000 mg by mouth 2 (two) times daily with a meal.    . nitroGLYCERIN (NITROSTAT) 0.4 MG SL tablet Place 0.4 mg under the tongue every 5 (five) minutes as needed for chest pain.    . pravastatin (PRAVACHOL) 20 MG tablet Take 20 mg by mouth at bedtime.     No current facility-administered medications for this visit.      Past Medical History:   Diagnosis Date  . Anemia   . Bradycardia   . CAD (coronary artery disease)   . Chest tightness   . Diabetes mellitus without complication (HCC)   . Dyslipidemia   . Dyspnea on exertion   . Hypertension   . Obesity   . Paroxysmal atrial fibrillation (HCC)    a. identified on device interrogation  . Pneumonia     ROS:   All systems reviewed and negative except as noted in the HPI.   Past Surgical History:  Procedure Laterality Date  . ACHILLES TENDON SURGERY     for rupture  . CARDIAC CATHETERIZATION  07/08/2009   mild to mod . prox LAD 50% (Dr. Garen LahEichhorn)  . CHOLECYSTECTOMY N/A 10/11/2017   Procedure: LAPAROSCOPIC CHOLECYSTECTOMY WITH INTRAOPERATIVE CHOLANGIOGRAM;  Surgeon: Ovidio KinNewman, David, MD;  [facility];  Service: General;  Laterality: N/A;  . DOPPLER ECHOCARDIOGRAPHY  04/10/2008   LVEF 70-80% ,norm  . PACEMAKER IMPLANT N/A 10/12/2017   Procedure: PACEMAKER IMPLANT;  Surgeon: Marinus Mawaylor, Teigan Manner W, MD;  [facility];  Service: Cardiovascular;  Laterality: N/A;  . stress myoview  04/09/2008   EF 54%; LV  norm  . TEMPORARY PACEMAKER N/A 10/11/2017   Procedure: TEMPORARY PACEMAKER;  Surgeon: Marinus Mawaylor, Aarilyn Dye W,  MD;  Location: [place] CV LAB;  Service: Cardiovascular;  Laterality: N/A;     Family History  Problem Relation Age of Onset  . Leukemia Mother      Social History   Socioeconomic History  . Marital status: Married    Spouse name: Not on file  . Number of children: Not on file  . Years of education: Not on file  . Highest education level: Not on file  Occupational History  . Occupation: retired  Scientific laboratory technician  . Financial resource strain: Not on file  . Food insecurity    Worry: Not on file    Inability: Not on file  . Transportation needs    Medical: Not on file    Non-medical: Not on file  Tobacco Use  . Smoking status: Never Smoker  . Smokeless tobacco: Never Used  Substance and Sexual Activity  . Alcohol use: No    Alcohol/week:  0.0 standard drinks  . Drug use: No  . Sexual activity: Not on file  Lifestyle  . Physical activity    Days per week: Not on file    Minutes per session: Not on file  . Stress: Not on file  Relationships  . Social Herbalist on phone: Not on file    Gets together: Not on file    Attends religious service: Not on file    Active member of club or organization: Not on file    Attends meetings of clubs or organizations: Not on file    Relationship status: Not on file  . Intimate partner violence    Fear of current or ex partner: Not on file    Emotionally abused: Not on file    Physically abused: Not on file    Forced sexual activity: Not on file  Other Topics Concern  . Not on file  Social History Narrative  . Not on file     BP 132/70   Pulse (!) 101   Ht 5\' 10"  (1.778 m)   Wt 223 lb (101.2 kg)   SpO2 97%   BMI 32.00 kg/m   Physical Exam:  Well appearing NAD HEENT: Unremarkable Neck:  No JVD, no thyromegally Lymphatics:  No adenopathy Back:  No CVA tenderness Lungs:  Clear with no wheezes HEART:  Regular rate rhythm, no murmurs, no rubs, no clicks Abd:  soft, positive bowel sounds, no organomegally, no rebound, no guarding Ext:  2 plus pulses, no edema, no cyanosis, no clubbing Skin:  No rashes no nodules Neuro:  CN II through XII intact, motor grossly intact  EKG - NSR with his bundle/RV pacing  DEVICE  Normal device function.  See PaceArt for details.   Assess/Plan: 1. CHB - he is asymptomatic, s/p PPM insertion. 2. PPM - his medtronic DDD PPM is working normally. We will recheck in several months. 3. HTN - his blood pressure is well controlled. No change in meds.  Mikle Bosworth.D.

## 2018-11-01 ENCOUNTER — Ambulatory Visit (INDEPENDENT_AMBULATORY_CARE_PROVIDER_SITE_OTHER): Payer: Medicare Other | Admitting: *Deleted

## 2018-11-01 DIAGNOSIS — I442 Atrioventricular block, complete: Secondary | ICD-10-CM | POA: Diagnosis not present

## 2018-11-02 ENCOUNTER — Telehealth: Payer: Self-pay

## 2018-11-02 LAB — CUP PACEART REMOTE DEVICE CHECK
Battery Remaining Longevity: 121 mo
Battery Voltage: 3 V
Brady Statistic AP VP Percent: 12.52 %
Brady Statistic AP VS Percent: 0 %
Brady Statistic AS VP Percent: 86.38 %
Brady Statistic AS VS Percent: 1.1 %
Brady Statistic RA Percent Paced: 12.77 %
Brady Statistic RV Percent Paced: 98.9 %
Date Time Interrogation Session: 20200731142159
Implantable Lead Implant Date: 20190711
Implantable Lead Implant Date: 20190711
Implantable Lead Location: 753859
Implantable Lead Location: 753860
Implantable Lead Model: 3830
Implantable Lead Model: 5076
Implantable Pulse Generator Implant Date: 20190711
Lead Channel Impedance Value: 285 Ohm
Lead Channel Impedance Value: 418 Ohm
Lead Channel Impedance Value: 475 Ohm
Lead Channel Impedance Value: 532 Ohm
Lead Channel Pacing Threshold Amplitude: 0.75 V
Lead Channel Pacing Threshold Amplitude: 1 V
Lead Channel Pacing Threshold Pulse Width: 0.4 ms
Lead Channel Pacing Threshold Pulse Width: 0.4 ms
Lead Channel Sensing Intrinsic Amplitude: 2.875 mV
Lead Channel Sensing Intrinsic Amplitude: 2.875 mV
Lead Channel Sensing Intrinsic Amplitude: 6.25 mV
Lead Channel Sensing Intrinsic Amplitude: 6.25 mV
Lead Channel Setting Pacing Amplitude: 2 V
Lead Channel Setting Pacing Amplitude: 2.5 V
Lead Channel Setting Pacing Pulse Width: 0.5 ms
Lead Channel Setting Sensing Sensitivity: 1.2 mV

## 2018-11-02 NOTE — Telephone Encounter (Signed)
Spoke with patient to remind of missed remote transmission 

## 2018-11-09 ENCOUNTER — Encounter: Payer: Self-pay | Admitting: Cardiology

## 2018-11-09 NOTE — Progress Notes (Signed)
Remote pacemaker transmission.   

## 2018-11-16 LAB — CUP PACEART INCLINIC DEVICE CHECK
Date Time Interrogation Session: 20200814155638
Implantable Lead Implant Date: 20190711
Implantable Lead Implant Date: 20190711
Implantable Lead Location: 753859
Implantable Lead Location: 753860
Implantable Lead Model: 3830
Implantable Lead Model: 5076
Implantable Pulse Generator Implant Date: 20190711

## 2019-02-12 IMAGING — NM NM HEPATO W/GB/PHARM/[PERSON_NAME]
3 series · 18 of 18 positions shown · non-contrast
Comparison: No prior nuclear medicine hepatobiliary scan.

CLINICAL DATA: Chronic RIGHT UPPER quadrant abdominal pain which
acutely worsened 3 days ago, associated with nausea and vomiting.

EXAM:
NUCLEAR MEDICINE HEPATOBILIARY IMAGING
TECHNIQUE: Sequential images of the abdomen were obtained [DATE] minutes
following intravenous administration of radiopharmaceutical. At 90
minutes, 3.0 mg of morphine were administered intravenously and
imaging was performed for an additional 30 minutes.
RADIOPHARMACEUTICALS:  5.3 mCi Lc-TTm Choletec IV

[he hepatobiliary · 4.52mm/px · 6 of 60 frames shown (1 of 3)]
[frame 6/60]
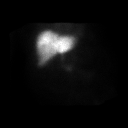
[frame 16/60]
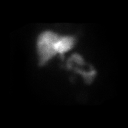
[frame 26/60]
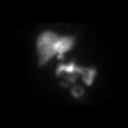
[frame 36/60]
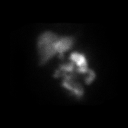
[frame 46/60]
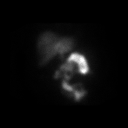
[frame 56/60]
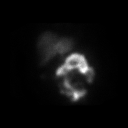

[he hepatobiliary · 4.52mm/px · 6 of 30 frames shown (2 of 3)]
[frame 3/30]
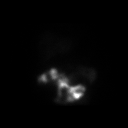
[frame 8/30]
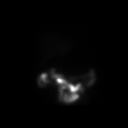
[frame 13/30]
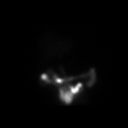
[frame 18/30]
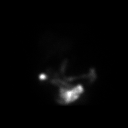
[frame 23/30]
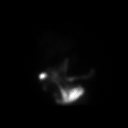
[frame 28/30]
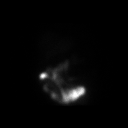

[he hepatobiliary · 4.52mm/px · 6 of 31 frames shown (3 of 3)]
[frame 3/31]
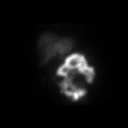
[frame 8/31]
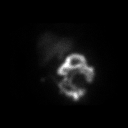
[frame 13/31]
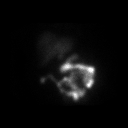
[frame 18/31]
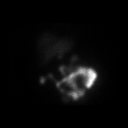
[frame 23/31]
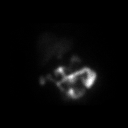
[frame 29/31]
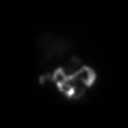

[18 of 18 positions shown; findings below may reference images not displayed]

RIGHT
UPPER quadrant abdominal ultrasound yesterday which demonstrated
gallbladder sludge but no sonographic evidence of acute
cholecystitis.
FINDINGS: Hepatic uptake of radiotracer is normal and there is prompt
excretion into the biliary system. Small bowel activity is visible
at approximately 10 minutes. The gallbladder was not visible [DATE] minutes. There is no evidence of tracer retention by the liver.

The gallbladder was not visible after intravenous administration of
morphine.
IMPRESSION: Nonvisualization of the gallbladder after intravenous morphine
administration indicating cystic duct obstruction and therefore
acute cholecystitis.

These results will be called to the ordering clinician or
representative by the Radiologist Assistant, and communication
documented in the PACS or zVision Dashboard.

## 2019-11-26 DIAGNOSIS — Z1211 Encounter for screening for malignant neoplasm of colon: Secondary | ICD-10-CM | POA: Diagnosis not present

## 2019-11-26 DIAGNOSIS — Z800 Family history of malignant neoplasm of digestive organs: Secondary | ICD-10-CM | POA: Diagnosis not present

## 2019-12-04 DIAGNOSIS — S86012A Strain of left Achilles tendon, initial encounter: Secondary | ICD-10-CM | POA: Diagnosis not present

## 2019-12-06 DIAGNOSIS — S86012D Strain of left Achilles tendon, subsequent encounter: Secondary | ICD-10-CM | POA: Diagnosis not present

## 2019-12-10 DIAGNOSIS — S86012D Strain of left Achilles tendon, subsequent encounter: Secondary | ICD-10-CM | POA: Diagnosis not present

## 2019-12-11 ENCOUNTER — Ambulatory Visit: Payer: Medicare Other | Admitting: Orthopaedic Surgery

## 2019-12-12 DIAGNOSIS — R262 Difficulty in walking, not elsewhere classified: Secondary | ICD-10-CM | POA: Diagnosis not present

## 2019-12-12 DIAGNOSIS — M25572 Pain in left ankle and joints of left foot: Secondary | ICD-10-CM | POA: Diagnosis not present

## 2019-12-12 DIAGNOSIS — M6281 Muscle weakness (generalized): Secondary | ICD-10-CM | POA: Diagnosis not present

## 2019-12-12 DIAGNOSIS — M25672 Stiffness of left ankle, not elsewhere classified: Secondary | ICD-10-CM | POA: Diagnosis not present

## 2019-12-18 DIAGNOSIS — S86012D Strain of left Achilles tendon, subsequent encounter: Secondary | ICD-10-CM | POA: Diagnosis not present

## 2019-12-18 DIAGNOSIS — M25672 Stiffness of left ankle, not elsewhere classified: Secondary | ICD-10-CM | POA: Diagnosis not present

## 2019-12-18 DIAGNOSIS — R262 Difficulty in walking, not elsewhere classified: Secondary | ICD-10-CM | POA: Diagnosis not present

## 2019-12-18 DIAGNOSIS — M6281 Muscle weakness (generalized): Secondary | ICD-10-CM | POA: Diagnosis not present

## 2020-01-01 DIAGNOSIS — M6281 Muscle weakness (generalized): Secondary | ICD-10-CM | POA: Diagnosis not present

## 2020-01-01 DIAGNOSIS — R262 Difficulty in walking, not elsewhere classified: Secondary | ICD-10-CM | POA: Diagnosis not present

## 2020-01-01 DIAGNOSIS — M25572 Pain in left ankle and joints of left foot: Secondary | ICD-10-CM | POA: Diagnosis not present

## 2020-01-01 DIAGNOSIS — M25672 Stiffness of left ankle, not elsewhere classified: Secondary | ICD-10-CM | POA: Diagnosis not present

## 2020-01-07 DIAGNOSIS — M25572 Pain in left ankle and joints of left foot: Secondary | ICD-10-CM | POA: Diagnosis not present

## 2020-01-07 DIAGNOSIS — M6281 Muscle weakness (generalized): Secondary | ICD-10-CM | POA: Diagnosis not present

## 2020-01-07 DIAGNOSIS — M25672 Stiffness of left ankle, not elsewhere classified: Secondary | ICD-10-CM | POA: Diagnosis not present

## 2020-01-07 DIAGNOSIS — R262 Difficulty in walking, not elsewhere classified: Secondary | ICD-10-CM | POA: Diagnosis not present

## 2020-01-08 DIAGNOSIS — M25572 Pain in left ankle and joints of left foot: Secondary | ICD-10-CM | POA: Diagnosis not present

## 2020-01-13 DIAGNOSIS — M6281 Muscle weakness (generalized): Secondary | ICD-10-CM | POA: Diagnosis not present

## 2020-01-13 DIAGNOSIS — R262 Difficulty in walking, not elsewhere classified: Secondary | ICD-10-CM | POA: Diagnosis not present

## 2020-01-13 DIAGNOSIS — M25672 Stiffness of left ankle, not elsewhere classified: Secondary | ICD-10-CM | POA: Diagnosis not present

## 2020-01-13 DIAGNOSIS — M25572 Pain in left ankle and joints of left foot: Secondary | ICD-10-CM | POA: Diagnosis not present

## 2020-08-28 ENCOUNTER — Emergency Department (HOSPITAL_COMMUNITY)
Admission: EM | Admit: 2020-08-28 | Discharge: 2020-08-28 | Disposition: A | Discharge status: Home / Self Care | Payer: No Typology Code available for payment source | Attending: Emergency Medicine | Admitting: Emergency Medicine

## 2020-08-28 ENCOUNTER — Encounter (HOSPITAL_COMMUNITY): Payer: Self-pay

## 2020-08-28 ENCOUNTER — Other Ambulatory Visit: Payer: Self-pay

## 2020-08-28 ENCOUNTER — Emergency Department (HOSPITAL_COMMUNITY): Payer: No Typology Code available for payment source

## 2020-08-28 DIAGNOSIS — I129 Hypertensive chronic kidney disease with stage 1 through stage 4 chronic kidney disease, or unspecified chronic kidney disease: Secondary | ICD-10-CM | POA: Diagnosis not present

## 2020-08-28 DIAGNOSIS — N3090 Cystitis, unspecified without hematuria: Secondary | ICD-10-CM

## 2020-08-28 DIAGNOSIS — N3000 Acute cystitis without hematuria: Secondary | ICD-10-CM | POA: Diagnosis not present

## 2020-08-28 DIAGNOSIS — J9811 Atelectasis: Secondary | ICD-10-CM | POA: Diagnosis not present

## 2020-08-28 DIAGNOSIS — Z950 Presence of cardiac pacemaker: Secondary | ICD-10-CM | POA: Diagnosis not present

## 2020-08-28 DIAGNOSIS — I2510 Atherosclerotic heart disease of native coronary artery without angina pectoris: Secondary | ICD-10-CM | POA: Diagnosis not present

## 2020-08-28 DIAGNOSIS — Z7984 Long term (current) use of oral hypoglycemic drugs: Secondary | ICD-10-CM | POA: Diagnosis not present

## 2020-08-28 DIAGNOSIS — R319 Hematuria, unspecified: Secondary | ICD-10-CM | POA: Diagnosis present

## 2020-08-28 DIAGNOSIS — Z79899 Other long term (current) drug therapy: Secondary | ICD-10-CM | POA: Diagnosis not present

## 2020-08-28 DIAGNOSIS — Z20822 Contact with and (suspected) exposure to covid-19: Secondary | ICD-10-CM | POA: Diagnosis not present

## 2020-08-28 DIAGNOSIS — Z7982 Long term (current) use of aspirin: Secondary | ICD-10-CM | POA: Diagnosis not present

## 2020-08-28 DIAGNOSIS — E1122 Type 2 diabetes mellitus with diabetic chronic kidney disease: Secondary | ICD-10-CM | POA: Insufficient documentation

## 2020-08-28 DIAGNOSIS — E785 Hyperlipidemia, unspecified: Secondary | ICD-10-CM | POA: Insufficient documentation

## 2020-08-28 DIAGNOSIS — Z794 Long term (current) use of insulin: Secondary | ICD-10-CM | POA: Insufficient documentation

## 2020-08-28 DIAGNOSIS — N183 Chronic kidney disease, stage 3 unspecified: Secondary | ICD-10-CM | POA: Insufficient documentation

## 2020-08-28 DIAGNOSIS — E1169 Type 2 diabetes mellitus with other specified complication: Secondary | ICD-10-CM | POA: Diagnosis not present

## 2020-08-28 DIAGNOSIS — B9689 Other specified bacterial agents as the cause of diseases classified elsewhere: Secondary | ICD-10-CM | POA: Diagnosis not present

## 2020-08-28 DIAGNOSIS — R310 Gross hematuria: Secondary | ICD-10-CM

## 2020-08-28 DIAGNOSIS — R509 Fever, unspecified: Secondary | ICD-10-CM | POA: Diagnosis not present

## 2020-08-28 LAB — CBC
HCT: 43 % (ref 39.0–52.0)
Hemoglobin: 13.8 g/dL (ref 13.0–17.0)
MCH: 28.3 pg (ref 26.0–34.0)
MCHC: 32.1 g/dL (ref 30.0–36.0)
MCV: 88.3 fL (ref 80.0–100.0)
Platelets: 287 10*3/uL (ref 150–400)
RBC: 4.87 MIL/uL (ref 4.22–5.81)
RDW: 14.1 % (ref 11.5–15.5)
WBC: 10.1 10*3/uL (ref 4.0–10.5)
nRBC: 0 % (ref 0.0–0.2)

## 2020-08-28 LAB — URINALYSIS, MICROSCOPIC (REFLEX)
Bacteria, UA: NONE SEEN
RBC / HPF: >50 RBC/hpf (ref 0–5)
Squamous Epithelial / HPF: NONE SEEN (ref 0–5)

## 2020-08-28 LAB — URINALYSIS, ROUTINE W REFLEX MICROSCOPIC

## 2020-08-28 LAB — BASIC METABOLIC PANEL
Anion gap: 9 (ref 5–15)
BUN: 23 mg/dL (ref 8–23)
CO2: 27 mmol/L (ref 22–32)
Calcium: 9.7 mg/dL (ref 8.9–10.3)
Chloride: 102 mmol/L (ref 98–111)
Creatinine, Ser: 1.29 mg/dL — ABNORMAL HIGH (ref 0.61–1.24)
GFR, Estimated: 57 mL/min — ABNORMAL LOW (ref >60)
Glucose, Bld: 152 mg/dL — ABNORMAL HIGH (ref 70–99)
Potassium: 3.2 mmol/L — ABNORMAL LOW (ref 3.5–5.1)
Sodium: 138 mmol/L (ref 135–145)

## 2020-08-28 LAB — RESP PANEL BY RT-PCR (FLU A&B, COVID) ARPGX2
Influenza A by PCR: NEGATIVE
Influenza B by PCR: NEGATIVE
SARS Coronavirus 2 by RT PCR: NEGATIVE

## 2020-08-28 MED ORDER — SODIUM CHLORIDE 0.9 % IV SOLN
1.0000 g | Freq: Once | INTRAVENOUS | Status: AC
  Administered 2020-08-28: 1 g via INTRAVENOUS
  Filled 2020-08-28: qty 10

## 2020-08-28 MED ORDER — ACETAMINOPHEN 500 MG PO TABS
1000.0000 mg | ORAL_TABLET | Freq: Once | ORAL | Status: DC
  Filled 2020-08-28: qty 2

## 2020-08-28 MED ORDER — ACETAMINOPHEN 325 MG PO TABS
650.0000 mg | ORAL_TABLET | Freq: Once | ORAL | Status: AC
  Administered 2020-08-28: 650 mg via ORAL
  Filled 2020-08-28: qty 2

## 2020-08-28 MED ORDER — CEFPODOXIME PROXETIL 200 MG PO TABS: 200.0000 mg | ORAL_TABLET | Freq: Two times a day (BID) | ORAL | 0 refills | Status: AC

## 2020-08-28 MED ORDER — CEFTRIAXONE SODIUM 1 G IJ SOLR
1.0000 g | Freq: Once | INTRAMUSCULAR | Status: DC
  Filled 2020-08-28: qty 10

## 2020-08-28 NOTE — Discharge Instructions (Addendum)
Follow-up closely with your normal doctor. Start taking the antibiotics as prescribed. Come back to the emergency department for severely worsening symptoms, lightheadedness, chest pain, severe abdominal pain, or the inability to urinate.  Also come back for any other emergent medical condition.

## 2020-08-28 NOTE — ED Provider Notes (Signed)
[facility] EMERGENCY DEPARTMENT Provider Note   CSN: [id number] Arrival date & time: 08/28/20  0935     History Chief Complaint  Patient presents with  . Hematuria    Matthew Sellers is a 80 y.o. male.  HPI 80 year old gentleman with history of CAD, diabetes, hypertension, and paroxysmal A. fib presents emergency department for hematuria.  States this has been present for the last 2 days.  States he has had gross hematuria about every 5 minutes.  States he has not been able to sleep.  Comes out a little at a time, looks like "prune juice."  Does state is also bright red.  Denies history of anything like this previously.  Nothing makes it better, nothing makes it worse.  Has not taken anything for the symptoms.  Was found to be febrile here.  States otherwise he does have some dysuria, no other symptoms at this time.    Past Medical History:  Diagnosis Date  . Anemia   . Bradycardia   . CAD (coronary artery disease)   . Chest tightness   . Diabetes mellitus without complication (HCC)   . Dyslipidemia   . Dyspnea on exertion   . Hypertension   . Obesity   . Paroxysmal atrial fibrillation (HCC)    a. identified on device interrogation  . Pneumonia     Patient Active Problem List   Diagnosis Date Noted  . Pacemaker 10/30/2018  . Paroxysmal atrial fibrillation (HCC) 10/30/2018  . Complete heart block (HCC)   . Abdominal pain 10/09/2017  . Fatty liver 10/09/2017  . CKD (chronic kidney disease) stage 3, GFR 30-59 ml/min (HCC) 10/09/2017  . Bradycardia 10/09/2017  . CAP (community acquired pneumonia) 06/29/2014  . Diabetes (HCC) 06/29/2014  . Hypertension 06/29/2014  . Hyperlipidemia 06/29/2014  . Hypokalemia 06/29/2014  . Anemia 06/29/2014  . Pneumonia 06/29/2014    Past Surgical History:  Procedure Laterality Date  . ACHILLES TENDON SURGERY     for rupture  . CARDIAC CATHETERIZATION  07/08/2009   mild to mod . prox LAD 50% (Dr. Garen Lah)  .  CHOLECYSTECTOMY N/A 10/11/2017   Procedure: LAPAROSCOPIC CHOLECYSTECTOMY WITH INTRAOPERATIVE CHOLANGIOGRAM;  Surgeon: Ovidio Kin, MD;  Location: [facility] OR;  Service: General;  Laterality: N/A;  . DOPPLER ECHOCARDIOGRAPHY  04/10/2008   LVEF 70-80% ,norm  . PACEMAKER IMPLANT N/A 10/12/2017   Procedure: PACEMAKER IMPLANT;  Surgeon: Marinus Maw, MD;  Location: [facility] INVASIVE CV LAB;  Service: Cardiovascular;  Laterality: N/A;  . stress myoview  04/09/2008   EF 54%; LV  norm  . TEMPORARY PACEMAKER N/A 10/11/2017   Procedure: TEMPORARY PACEMAKER;  Surgeon: Marinus Maw, MD;  Location: [facility] INVASIVE CV LAB;  Service: Cardiovascular;  Laterality: N/A;       Family History  Problem Relation Age of Onset  . Leukemia Mother     Social History   Tobacco Use  . Smoking status: Never Smoker  . Smokeless tobacco: Never Used  Vaping Use  . Vaping Use: Never used  Substance Use Topics  . Alcohol use: No    Alcohol/week: 0.0 standard drinks  . Drug use: No    Home Medications Prior to Admission medications   Medication Sig Start Date End Date Taking? Authorizing Provider  cefpodoxime (VANTIN) 200 MG tablet Take 1 tablet (200 mg total) by mouth 2 (two) times daily for 10 days. 08/28/20 09/07/20 Yes Nitisha Civello, Penni Bombard, DO  amLODipine (NORVASC) 10 MG tablet Take 10 mg by mouth daily.  [provider]  aspirin EC 81 MG tablet Take 81 mg by mouth daily.    [provider]  Brinzolamide-Brimonidine 1-0.2 % SUSP Place 1 drop into both eyes 2 (two) times daily.    [provider]  fluticasone (FLONASE) 50 MCG/ACT nasal spray Place 2 sprays into both nostrils daily as needed for allergies or rhinitis.    [provider]  gabapentin (NEURONTIN) 100 MG capsule Take 1 capsule by mouth 3 (three) times daily. 01/26/18   [provider]  HYDROcodone-acetaminophen (NORCO/VICODIN) 5-325 MG tablet Take 1-2 tablets by mouth every 4 (four) hours as needed for moderate  pain. 10/14/17   Leatha Gilding, MD  insulin glargine (LANTUS) 100 UNIT/ML injection Inject 30 Units into the skin at bedtime.     [provider]  JARDIANCE 25 MG TABS tablet Take 1 tablet by mouth daily. 07/15/18   [provider]  Boris Lown Oil 300 MG CAPS Take 300 mg by mouth daily.    [provider]  latanoprost (XALATAN) 0.005 % ophthalmic solution Place 1 drop into both eyes at bedtime.    [provider]  lidocaine (LIDODERM) 5 % Place 1 patch onto the skin daily. Remove & Discard patch within 12 hours or as directed by MD 01/16/18   McDonald, Mia A, PA-C  lisinopril (ZESTRIL) 20 MG tablet Take 2 tablets by mouth daily.    [provider]  metFORMIN (GLUCOPHAGE) 1000 MG tablet Take 1,000 mg by mouth 2 (two) times daily with a meal.    [provider]  nitroGLYCERIN (NITROSTAT) 0.4 MG SL tablet Place 0.4 mg under the tongue every 5 (five) minutes as needed for chest pain.    [provider]  pravastatin (PRAVACHOL) 20 MG tablet Take 20 mg by mouth at bedtime.    [provider]    Allergies    Patient has no known allergies.  Review of Systems   Review of Systems  Constitutional: Positive for fever. Negative for chills.  HENT: Negative for ear pain and sore throat.   Eyes: Negative for pain and visual disturbance.  Respiratory: Negative for cough and shortness of breath.   Cardiovascular: Negative for chest pain and palpitations.  Gastrointestinal: Negative for abdominal pain and vomiting.  Genitourinary: Positive for dysuria and hematuria.  Musculoskeletal: Negative for arthralgias and back pain.  Skin: Negative for color change and rash.  Neurological: Negative for seizures and syncope.  All other systems reviewed and are negative.   Physical Exam Updated Vital Signs BP (!) 141/61   Pulse 81   Temp 99.9 F (37.7 C) (Oral)   Resp 16   SpO2 99%   Physical Exam Vitals and nursing note reviewed.   Constitutional:      Appearance: He is well-developed.  HENT:     Head: Normocephalic and atraumatic.     Nose: Nose normal.     Mouth/Throat:     Mouth: Mucous membranes are moist.  Eyes:     Conjunctiva/sclera: Conjunctivae normal.  Cardiovascular:     Rate and Rhythm: Normal rate and regular rhythm.     Heart sounds: Normal heart sounds. No murmur heard.   Pulmonary:     Effort: Pulmonary effort is normal. No respiratory distress.     Breath sounds: Normal breath sounds.  Abdominal:     General: Abdomen is flat.     Palpations: Abdomen is soft.     Tenderness: There is no abdominal tenderness. There is no right  CVA tenderness, left CVA tenderness, guarding or rebound.  Musculoskeletal:        General: Normal range of motion.     Cervical back: Neck supple.     Right lower leg: No edema.     Left lower leg: No edema.  Skin:    General: Skin is warm and dry.     Capillary Refill: Capillary refill takes less than 2 seconds.  Neurological:     General: No focal deficit present.     Mental Status: He is alert and oriented to person, place, and time.  Psychiatric:        Mood and Affect: Mood normal.        Behavior: Behavior normal.     ED Results / Procedures / Treatments   Labs (all labs ordered are listed, but only abnormal results are displayed) Labs Reviewed  URINALYSIS, ROUTINE W REFLEX MICROSCOPIC - Abnormal; Notable for the following components:      Result Value   Color, Urine RED (*)    APPearance TURBID (*)    Glucose, UA   (*)    Value: TEST NOT REPORTED DUE TO COLOR INTERFERENCE OF URINE PIGMENT   Hgb urine dipstick   (*)    Value: TEST NOT REPORTED DUE TO COLOR INTERFERENCE OF URINE PIGMENT   Bilirubin Urine   (*)    Value: TEST NOT REPORTED DUE TO COLOR INTERFERENCE OF URINE PIGMENT   Ketones, ur   (*)    Value: TEST NOT REPORTED DUE TO COLOR INTERFERENCE OF URINE PIGMENT   Protein, ur   (*)    Value: TEST NOT REPORTED DUE TO COLOR INTERFERENCE OF  URINE PIGMENT   Nitrite   (*)    Value: TEST NOT REPORTED DUE TO COLOR INTERFERENCE OF URINE PIGMENT   Leukocytes,Ua   (*)    Value: TEST NOT REPORTED DUE TO COLOR INTERFERENCE OF URINE PIGMENT   All other components within normal limits  BASIC METABOLIC PANEL - Abnormal; Notable for the following components:   Potassium 3.2 (*)    Glucose, Bld 152 (*)    Creatinine, Ser 1.29 (*)    GFR, Estimated 57 (*)    All other components within normal limits  RESP PANEL BY RT-PCR (FLU A&B, COVID) ARPGX2  URINE CULTURE  CULTURE, BLOOD (ROUTINE X 2)  CULTURE, BLOOD (ROUTINE X 2)  CBC  URINALYSIS, MICROSCOPIC (REFLEX)    EKG None  Radiology DG Chest Portable 1 View  Result Date: 08/28/2020 CLINICAL DATA:  Fever. EXAM: PORTABLE CHEST 1 VIEW COMPARISON:  None. FINDINGS: Heart size exaggerated by low lung volumes. Minimal bibasilar atelectasis is present. Lungs are otherwise clear. No edema or effusion is present. IMPRESSION: 1. Low lung volumes. 2. No acute cardiopulmonary disease. Electronically Signed   By: Marin Robertshristopher  Mattern M.D.   On: 08/28/2020 11:31    Procedures Procedures   Medications Ordered in ED Medications  acetaminophen (TYLENOL) tablet 1,000 mg (1,000 mg Oral Not Given 08/28/20 1100)  cefTRIAXone (ROCEPHIN) 1 g in sodium chloride 0.9 % 100 mL IVPB (1 g Intravenous New Bag/Given 08/28/20 1400)  acetaminophen (TYLENOL) tablet 650 mg (650 mg Oral Given 08/28/20 [id number])    ED Course  I have reviewed the triage vital signs and the nursing notes.  Pertinent labs & imaging results that were available during my care of the patient were reviewed by me and considered in my medical decision making (see chart for details).    MDM Rules/Calculators/A&P  80 year old male presenting with hematuria.  Vital signs are stable, he is not in acute distress.  Exam shows well-appearing male, not in acute distress.  Abdomen is soft and nontender.  No flank tenderness.   Heart regular rate and rhythm.  Most likely diagnosis would be cystitis with hematuria due to fever.  Less likely nephrolithiasis without flank pain or abdominal pain.  Differential does include bladder cancer, but that would be less likely to cause fever.  He denies history of penile trauma.  Less likely epididymitis as he has no testicular pain or tenderness.  Upon initial evaluation, patient is well-appearing with reassuring vital signs.  Does not meet sepsis criteria at this time.  Will obtain labs to further risk stratify patient.  Labs show kidney function.  Urinalysis with gross hematuria, did have leukocytes on microscopic urinalysis.  No significant leukocytosis.  Chest x-ray showed no acute findings or cause infection.  COVID test negative.  Due to clinical picture of fever, dysuria, and hematuria, suspect patient has hemorrhagic cystitis.  Given first dose of antibiotics with IV Rocephin in the emergency department.  I discussed findings with patient and his wife.  They feel comfortable going home.  Postvoid residual showed no significant retention of urine.  We did discuss symptomatic management and return precautions.  Prescribed Vantin for outpatient antibiotics.  Given precautions for possible obstruction, though patient is not having issues with this currently.  Recommended very close PCP follow-up, they voiced understanding and agreement.  Patient was discharged in stable condition.  Final Clinical Impression(s) / ED Diagnoses Final diagnoses:  Cystitis  Gross hematuria    Rx / DC Orders ED Discharge Orders         Ordered    cefpodoxime (VANTIN) 200 MG tablet  2 times daily        08/28/20 1347           Louretta Parma, DO 08/28/20 1421    Tegeler, Canary Brim, MD 08/29/20 [phone]

## 2020-08-28 NOTE — ED Notes (Signed)
Pt d/c home per MD order. Discharge summary reviewed with pt, pt verbalizes understanding. No s/s of acute distress noted at discharge. Off unit via WC- Discharged home with wife.  

## 2020-08-28 NOTE — ED Triage Notes (Addendum)
Pt reports x2 days blood in his urine.Pt reports freq urination.Pt reports heavy bleeding. Pt is febrile in triage.

## 2020-08-30 LAB — URINE CULTURE: Culture: >=100000 — AB

## 2020-08-31 ENCOUNTER — Telehealth: Payer: Self-pay | Admitting: Emergency Medicine

## 2020-08-31 NOTE — Telephone Encounter (Signed)
Post ED Visit - Positive Culture Follow-up  Culture report reviewed by antimicrobial stewardship pharmacist: [facility] []  , Pharm.D. []  Enzo Bi, Pharm.D., BCPS AQ-ID []  , Pharm.D., BCPS []  Celedonio Miyamoto, Pharm.D., BCPS []  [place], Garvin Fila.D., BCPS, AAHIVP []  , Pharm.D., BCPS, AAHIVP [x]  Georgina Pillion, PharmD, BCPS []  , PharmD, BCPS []  [place], PharmD, BCPS []  [address], PharmD []  , PharmD, BCPS []  Estella Husk, PharmD  Pharmacy Team []  Lysle Pearl, PharmD []  , PharmD []  Phillips Climes, PharmD []  , Rph []  Agapito Games) , PharmD []  Verlan Friends, PharmD []  , PharmD []  Mervyn Gay, PharmD []  , PharmD []  Vinnie Level, PharmD []  [facility], PharmD []  , PharmD []  Len Childs, PharmD   Positive urine culture Treated with cefpodoxime, organism sensitive to the same and no further patient follow-up is required at this time.  08/31/2020, 10:03 AM

## 2020-09-02 LAB — CULTURE, BLOOD (ROUTINE X 2)
Culture: NO GROWTH
Culture: NO GROWTH
Special Requests: ADEQUATE

## 2021-03-19 NOTE — Progress Notes (Signed)
[facility] Note  03/22/2021     CHIEF COMPLAINT Patient presents for Retina Evaluation   HISTORY OF PRESENT ILLNESS: Matthew Sellers is a 80 y.o. male who presents to the clinic today for:   HPI     Retina Evaluation   In both eyes.  Associated Symptoms Floaters.  I, the attending physician,  performed the HPI with the patient and updated documentation appropriately.        Comments   80 y/o male pt referred by Saidivya Komma from the [place] for eval of PDR OD/NPDR OS.  LEE 12.12.22.  Pt has regular eye exams.  DVA good OU cc.  Sometimes has difficulty seeing computer screen clearly.  Pt's glasses are 70 mos old.  Denies pain, FOL.  Has occasional floaters OU.  BS runs between 120 and 150 at home.  Last HgA1C 7.9.  Pt has been diabetic for about 30 yrs, and is on insulin.  Simbrinza BID OU Latanoprost QHS OU Timolol XE QAM OU Refresh prn OU      Last edited by Bernarda Caffey, MD on 03/22/2021 12:35 PM.    Pt here on the referral of the [facility] for DM exam, pt believes he has seen a retina specialist "some time ago" in [place]. [place], [place], pt receives routine eye exams every 6 months at the [place] and was not having any problems during his last exam there, pt is on drops for IOP per Dr. Anderson Malta at the [facility], pt has been diabetic since 1988, he is on Lantus and metformin (1058m BID)  Referring physician: [facility]   [id number] HISTORICAL INFORMATION:   Selected notes from the MEDICAL RECORD NUMBER Referred by [facility] DM exam LEE:  Ocular Hx- PMH-    CURRENT MEDICATIONS: Current Outpatient Medications (Ophthalmic Drugs)  Medication Sig   Brinzolamide-Brimonidine 1-0.2 % SUSP Place 1 drop into both eyes 2 (two) times daily.   carboxymethylcellulose (REFRESH PLUS) 0.5 % SOLN INSTILL 1 DROP IN BOTH EYES FIVE TIMES A DAY   latanoprost (XALATAN) 0.005 % ophthalmic solution Place 1 drop into both  eyes at bedtime.   Latanoprost 0.005 % EMUL    timolol (TIMOPTIC-XR) 0.5 % ophthalmic gel-forming SMARTSIG:In Eye(s)   No current facility-administered medications for this visit. (Ophthalmic Drugs)   Current Outpatient Medications (Other)  Medication Sig   amLODipine (NORVASC) 10 MG tablet Take 10 mg by mouth daily.   aspirin EC 81 MG tablet Take 81 mg by mouth daily.   cefpodoxime (VANTIN) 200 MG tablet    gabapentin (NEURONTIN) 100 MG capsule Take 1 capsule by mouth 3 (three) times daily.   gabapentin (NEURONTIN) 100 MG capsule Take 2 capsules by mouth at bedtime.   insulin glargine (LANTUS) 100 UNIT/ML injection Inject 30 Units into the skin at bedtime.    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   Krill Oil 300 MG CAPS Take 300 mg by mouth daily.   lidocaine (LIDODERM) 5 % Place 1 patch onto the skin daily. Remove & Discard patch within 12 hours or as directed by MD   lisinopril (ZESTRIL) 20 MG tablet Take 2 tablets by mouth daily.   lisinopril-hydrochlorothiazide (ZESTORETIC) 20-25 MG tablet Take 2 tablets by mouth every morning.   metFORMIN (GLUCOPHAGE) 1000 MG tablet Take 1,000 mg by  mouth 2 (two) times daily with a meal.   pravastatin (PRAVACHOL) 20 MG tablet Take 20 mg by mouth at bedtime.   PRECISION XTRA TEST STRIPS test strip    fluticasone (FLONASE) 50 MCG/ACT nasal spray Place 2 sprays into both nostrils daily as needed for allergies or rhinitis.   HYDROcodone-acetaminophen (NORCO/VICODIN) 5-325 MG tablet Take 1-2 tablets by mouth every 4 (four) hours as needed for moderate pain.   nitroGLYCERIN (NITROSTAT) 0.4 MG SL tablet Place 0.4 mg under the tongue every 5 (five) minutes as needed for chest pain.   No current facility-administered medications for this visit. (Other)   REVIEW OF SYSTEMS: ROS   Positive for: Genitourinary,  Endocrine, Cardiovascular, Eyes Negative for: Constitutional, Gastrointestinal, Neurological, Skin, Musculoskeletal, HENT, Respiratory, Psychiatric, Allergic/Imm, Heme/Lymph Last edited by Matthew Folks, COA on 03/22/2021  8:10 AM.     ALLERGIES No Known Allergies  PAST MEDICAL HISTORY Past Medical History:  Diagnosis Date   Anemia    Bradycardia    CAD (coronary artery disease)    Chest tightness    Chronic kidney disease    Diabetes mellitus without complication (HCC)    Diabetic retinopathy ([place])    Dyslipidemia    Dyspnea on exertion    Glaucoma    Hypertension    Obesity    Paroxysmal atrial fibrillation ([place])    a. identified on device interrogation   Pneumonia    Past Surgical History:  Procedure Laterality Date   ACHILLES TENDON SURGERY     for rupture   CARDIAC CATHETERIZATION  07/08/2009   mild to mod . prox LAD 50% (Dr. Felton Clinton)   CATARACT EXTRACTION     CHOLECYSTECTOMY N/A 10/11/2017   Procedure: LAPAROSCOPIC CHOLECYSTECTOMY WITH INTRAOPERATIVE CHOLANGIOGRAM;  Surgeon: Alphonsa Overall, MD;  Location: [facility];  Service: General;  Laterality: N/A;   DOPPLER ECHOCARDIOGRAPHY  04/10/2008   LVEF 70-80% ,norm   EYE SURGERY     PACEMAKER IMPLANT N/A 10/12/2017   Procedure: PACEMAKER IMPLANT;  Surgeon: Evans Lance, MD;  Location: [facility];  Service: Cardiovascular;  Laterality: N/A;   stress myoview  04/09/2008   EF 54%; LV  norm   TEMPORARY PACEMAKER N/A 10/11/2017   Procedure: TEMPORARY PACEMAKER;  Surgeon: Evans Lance, MD;  Location: [facility];  Service: Cardiovascular;  Laterality: N/A;   FAMILY HISTORY Family History  Problem Relation Age of Onset   Leukemia Mother    SOCIAL HISTORY Social History   Tobacco Use   Smoking status: Never   Smokeless tobacco: Never  Vaping Use   Vaping Use: Never used  Substance Use Topics   Alcohol use: No    Alcohol/week: 0.0 standard drinks   Drug use: No       OPHTHALMIC  EXAM:  Base Eye Exam     Visual Acuity (Snellen - Linear)       Right Left   Dist cc 20/25 20/20 -2   Dist ph cc NI     Correction: Glasses         Tonometry (Tonopen, 8:31 AM)       Right Left   Pressure 13 18         Pupils       Dark Light Shape React APD   Right 4 3 Round Brisk None   Left 4 3 Round Brisk None         Visual Fields (Counting fingers)       Left Right  Full Full         Extraocular Movement       Right Left    Full, Ortho Full, Ortho         Neuro/Psych     Oriented x3: Yes   Mood/Affect: Normal         Dilation     Both eyes: 1.0% Mydriacyl, 2.5% Phenylephrine @ 8:31 AM           Slit Lamp and Fundus Exam     Slit Lamp Exam       Right Left   Lids/Lashes Dermatochalasis - upper lid, mild MGD Dermatochalasis - upper lid, mild MGD   Conjunctiva/Sclera nasal pingeucula, Melanosis nasal pingeucula, Melanosis   Cornea 2-3+ Punctate epithelial erosions, well healed cataract wound 2-3+ Punctate epithelial erosions, well healed cataract wound   Anterior Chamber Deep and quiet Deep and quiet   Iris round and poorly dilated to 73m, No NVI round and poorly dilated to 453m No NVI   Lens PC IOL in good position PC IOL in good position   Anterior Vitreous Mild Vitreous syneresis, Posterior vitreous detachment, blood stained vitreous condensations Mild Vitreous syneresis, Posterior vitreous detachment, vitreous condensations         Fundus Exam       Right Left   Disc mild Pallor, Sharp rim, +cupping mild Pallor, Sharp rim, +cupping, focal PPP temporal   C/D Ratio 0.6 0.6   Macula Flat, Blunted foveal reflex, scattered Microaneurysms Flat, Blunted foveal reflex, scattered MA   Vessels attenuated, Tortuous attenuated, Tortuous, no obvious NV, Copper wiring   Periphery Attached, +NV along inferior arcades, +NV nasal midzone, 360 MA/DBH, pre-retinal hemes settled inferiorly Attached, 360 MA/DBH, focal exudates nasal midzone            Refraction     Wearing Rx       Sphere Cylinder Axis Add   Right -0.75 +1.25 004 +2.75   Left -0.75 +1.25 004 +2.75    Age: 51 mos   Type: PAL         Manifest Refraction       Sphere Cylinder Axis Dist VA   Right -1.00 +1.25 005 20/25+   Left -1.00 +1.50 007 20/20-            IMAGING AND PROCEDURES  Imaging and Procedures for 03/22/2021  OCT, Retina - OU - Both Eyes       Right Eye Quality was good. Central Foveal Thickness: 298. Progression has no prior data. Findings include abnormal foveal contour, no IRF, no SRF, epiretinal membrane, preretinal fibrosis (ERM with cystic changes and pre-retinal hyper-reflective material inferior fovea and mac).   Left Eye Quality was good. Central Foveal Thickness: 265. Progression has no prior data. Findings include normal foveal contour, intraretinal fluid, intraretinal hyper-reflective material, no SRF (Focal cystic changes/IRHM temporal macula, trace vitreous opacities).   Notes *Images captured and stored on drive  Diagnosis / Impression:  OD: ERM with cystic changes and pre-retinal hyper-reflective material inferior fovea and mac OS: Focal cystic changes/IRHM temporal macula  Clinical management:  See below  Abbreviations: NFP - Normal foveal profile. CME - cystoid macular edema. PED - pigment epithelial detachment. IRF - intraretinal fluid. SRF - subretinal fluid. EZ - ellipsoid zone. ERM - epiretinal membrane. ORA - outer retinal atrophy. ORT - outer retinal tubulation. SRHM - subretinal hyper-reflective material. IRHM - intraretinal hyper-reflective material      Intravitreal Injection, Pharmacologic Agent - OD - Right Eye  Time Out 03/22/2021. 9:57 AM. Confirmed correct patient, procedure, site, and patient consented.   Anesthesia Topical anesthesia was used. Anesthetic medications included Lidocaine 2%, Proparacaine 0.5%.   Procedure Preparation included 5% betadine to ocular surface,  eyelid speculum. A supplied needle was used.   Injection: 1.25 mg Bevacizumab 1.37m/0.05ml   Route: Intravitreal, Site: Right Eye   NDC:: 00867-619-50 Lot: [id number] , Expiration date: 05/11/2021, Waste: 0 mL   Post-op Post injection exam found visual acuity of at least counting fingers. The patient tolerated the procedure well. There were no complications. The patient received written and verbal post procedure care education. Post injection medications were not given.      Fluorescein Angiography Optos (Transit OD)       Right Eye Progression has no prior data. Early phase findings include vascular perfusion defect, blockage, microaneurysm, retinal neovascularization. Mid/Late phase findings include blockage, leakage, microaneurysm, retinal neovascularization, vascular perfusion defect (+NV along inferior arcades, focal NV nasal midzone, extensive vascular non-perfusion inferior periphery).   Left Eye Progression has no prior data. Early phase findings include microaneurysm, vascular perfusion defect (No NV). Mid/Late phase findings include microaneurysm, vascular perfusion defect, leakage (No NV, vascular non-perfusion temporal periphery with perivascular leakage).   Notes **Images stored on drive**  Impression: PDR OD -- +NV along inferior arcades, focal NV nasal midzone, extensive vascular non-perfusion inferior periphery Severe NPDR OS -- No NV, vascular non-perfusion temporal periphery with perivascular leakage            ASSESSMENT/PLAN:    ICD-10-CM   1. Proliferative diabetic retinopathy of right eye without macular edema associated with type 2 diabetes mellitus (HCC)  E11.3591 OCT, Retina - OU - Both Eyes    Intravitreal Injection, Pharmacologic Agent - OD - Right Eye    Bevacizumab (AVASTIN) SOLN 1.25 mg    2. Vitreous hemorrhage, right eye (HCC)  H43.11 Intravitreal Injection, Pharmacologic Agent - OD - Right Eye    Bevacizumab (AVASTIN) SOLN 1.25 mg    3.  Severe nonproliferative diabetic retinopathy of left eye with macular edema associated with type 2 diabetes mellitus ([place]  ED32.6712    4. Epiretinal membrane (ERM) of right eye  H35.371     5. Essential hypertension  I10     6. Hypertensive retinopathy of both eyes  H35.033 Fluorescein Angiography Optos (Transit OD)    7. Pseudophakia, both eyes  Z96.1       1,2. Proliferative diabetic retinopathy w/ vitreous hemorrhage, OD   [facility] Dr. G[facility] Malta- The incidence, risk factors for progression, natural history and treatment options for diabetic retinopathy were discussed with patient.   - The need for close monitoring of blood glucose, blood pressure, and serum lipids, avoiding cigarette or any type of tobacco, and the need for long term follow up was also discussed with patient. - BCVA 20/25 - exam shows +NV along inferior arcades and nasal midzone - FA today (12.19.22) shows +NV along inferior arcades, focal NV nasal midzone, extensive vascular non-perfusion inferior periphery - OCT without frank diabetic macular edema, right eye -- just tr cystic changes - discussed findings, prognosis and treatment options - will need PRP OD for NVE, but recommend IVA OD #1 first today, [facility] - pt wishes to proceed with injection - RBA of procedure discussed, questions answered - IVA informed consent obtained and signed, 12.19.22 - see procedure note - f/u 4 weeks, DFE, OCT, possible injection  3. Severe nonproliferative diabetic  retinopathy w/ DME, left eye  - BCVA 20/20 - exam shows scattered MA, DBH, no NVOS  - FA (12.19.22) shows no NV, vascular non-perfusion temporal periphery with perivascular leakage - OCT with +cystic changes temporal mac, left eye  - monitor for now  4. Epiretinal membrane, right eye  - The natural history, anatomy, potential for loss of vision, and treatment options including vitrectomy techniques and the complications  of endophthalmitis, retinal detachment, vitreous hemorrhage, cataract progression and permanent vision loss discussed with the patient. - mild ERM OD - BCVA 20/25 - asymptomatic, no metamorphopsia - no indication for surgery at this time - monitor for now  5,6. Hypertensive retinopathy OU - discussed importance of tight BP control - monitor  7. Pseudophakia OU  - s/p CE/IOL OU  - IOLs in good position, doing well  - monitor   Ophthalmic Meds Ordered this visit:  Meds ordered this encounter  Medications   Bevacizumab (AVASTIN) SOLN 1.25 mg     Return in about 4 weeks (around 04/19/2021) for f/u PDR OD, DFE, OCT.  There are no Patient Instructions on file for this visit.   Explained the diagnoses, plan, and follow up with the patient and they expressed understanding.  Patient expressed understanding of the importance of proper follow up care.   This document serves as a record of services personally performed by Gardiner Sleeper, MD, PhD. It was created on their behalf by Roselee Nova, COMT. The creation of this record is the provider's dictation and/or activities during the visit.  Electronically signed by: Roselee Nova, COMT 03/22/21 12:48 PM  This document serves as a record of services personally performed by Gardiner Sleeper, MD, PhD. It was created on their behalf by San Jetty. Owens Shark, OA an ophthalmic technician. The creation of this record is the provider's dictation and/or activities during the visit.    Electronically signed by: San Jetty. Owens Shark, [place] 12.19.2022 12:48 PM   Gardiner Sleeper, M.D., Ph.D. Diseases & Surgery of the Retina and Vitreous [facility]  I have reviewed the above documentation for accuracy and completeness, and I agree with the above. Gardiner Sleeper, M.D., Ph.D. 03/22/21 12:48 PM  Abbreviations: M myopia (nearsighted); A astigmatism; H hyperopia (farsighted); P presbyopia; Mrx spectacle prescription;  CTL contact lenses; OD  right eye; OS left eye; OU both eyes  XT exotropia; ET esotropia; PEK punctate epithelial keratitis; PEE punctate epithelial erosions; DES dry eye syndrome; MGD meibomian gland dysfunction; ATs artificial tears; PFAT's preservative free artificial tears; [place] nuclear sclerotic cataract; PSC posterior subcapsular cataract; ERM epi-retinal membrane; PVD posterior vitreous detachment; RD retinal detachment; DM diabetes mellitus; DR diabetic retinopathy; NPDR non-proliferative diabetic retinopathy; PDR proliferative diabetic retinopathy; CSME clinically significant macular edema; DME diabetic macular edema; dbh dot blot hemorrhages; CWS cotton wool spot; POAG primary open angle glaucoma; C/D cup-to-disc ratio; HVF humphrey visual field; GVF goldmann visual field; OCT optical coherence tomography; IOP intraocular pressure; BRVO Branch retinal vein occlusion; CRVO central retinal vein occlusion; CRAO central retinal artery occlusion; BRAO branch retinal artery occlusion; RT retinal tear; SB scleral buckle; PPV pars plana vitrectomy; VH Vitreous hemorrhage; PRP panretinal laser photocoagulation; IVK intravitreal kenalog; VMT vitreomacular traction; MH Macular hole;  NVD neovascularization of the disc; NVE neovascularization elsewhere; AREDS age related eye disease study; ARMD age related macular degeneration; POAG primary open angle glaucoma; EBMD epithelial/anterior basement membrane dystrophy; ACIOL anterior chamber intraocular lens; IOL intraocular lens; PCIOL posterior chamber intraocular lens; Phaco/IOL phacoemulsification with  intraocular lens placement; [place] photorefractive keratectomy; LASIK laser assisted in situ keratomileusis; HTN hypertension; DM diabetes mellitus; COPD chronic obstructive pulmonary disease

## 2021-03-22 ENCOUNTER — Ambulatory Visit (INDEPENDENT_AMBULATORY_CARE_PROVIDER_SITE_OTHER): Payer: No Typology Code available for payment source | Admitting: Ophthalmology

## 2021-03-22 ENCOUNTER — Other Ambulatory Visit: Payer: Self-pay

## 2021-03-22 ENCOUNTER — Encounter (INDEPENDENT_AMBULATORY_CARE_PROVIDER_SITE_OTHER): Payer: Self-pay | Admitting: Ophthalmology

## 2021-03-22 DIAGNOSIS — Z961 Presence of intraocular lens: Secondary | ICD-10-CM

## 2021-03-22 DIAGNOSIS — H4311 Vitreous hemorrhage, right eye: Secondary | ICD-10-CM | POA: Diagnosis not present

## 2021-03-22 DIAGNOSIS — E113412 Type 2 diabetes mellitus with severe nonproliferative diabetic retinopathy with macular edema, left eye: Secondary | ICD-10-CM

## 2021-03-22 DIAGNOSIS — H35371 Puckering of macula, right eye: Secondary | ICD-10-CM

## 2021-03-22 DIAGNOSIS — E113591 Type 2 diabetes mellitus with proliferative diabetic retinopathy without macular edema, right eye: Secondary | ICD-10-CM

## 2021-03-22 DIAGNOSIS — I10 Essential (primary) hypertension: Secondary | ICD-10-CM

## 2021-03-22 DIAGNOSIS — H35033 Hypertensive retinopathy, bilateral: Secondary | ICD-10-CM

## 2021-03-22 MED ORDER — BEVACIZUMAB CHEMO INJECTION 1.25MG/0.05ML SYRINGE FOR KALEIDOSCOPE
1.2500 mg | INTRAVITREAL | Status: AC | PRN
  Administered 2021-03-22: 13:00:00 1.25 mg via INTRAVITREAL

## 2021-04-16 NOTE — Progress Notes (Signed)
[facility] Note  04/19/2021    CHIEF COMPLAINT Patient presents for Retina Follow Up   HISTORY OF PRESENT ILLNESS: Matthew Sellers is a 81 y.o. male who presents to the clinic today for:   HPI     Retina Follow Up   Patient presents with  Diabetic Retinopathy.  In both eyes.  Duration of 4 weeks.  Since onset it is stable.  I, the attending physician,  performed the HPI with the patient and updated documentation appropriately.        Comments   4 week follow up PDR OD-  No changes since last visit OU. Taking Simbrinza BID OU, Latanoprost qhs OU, Timolol XR qam OU. BS 148 yesterday A1C unsure      Last edited by Bernarda Caffey, MD on 04/19/2021 12:20 PM.    Pt states floaters have improved, no problems with first injection  Referring physician: No referring provider defined for this encounter.  HISTORICAL INFORMATION:   Selected notes from the MEDICAL RECORD NUMBER [facility] LEE:  Ocular Hx- PMH-    CURRENT MEDICATIONS: Current Outpatient Medications (Ophthalmic Drugs)  Medication Sig   Brinzolamide-Brimonidine 1-0.2 % SUSP Place 1 drop into both eyes 2 (two) times daily.   carboxymethylcellulose (REFRESH PLUS) 0.5 % SOLN INSTILL 1 DROP IN BOTH EYES FIVE TIMES A DAY   latanoprost (XALATAN) 0.005 % ophthalmic solution Place 1 drop into both eyes at bedtime.   timolol (TIMOPTIC-XR) 0.5 % ophthalmic gel-forming SMARTSIG:In Eye(s)   Latanoprost 0.005 % EMUL    No current facility-administered medications for this visit. (Ophthalmic Drugs)   Current Outpatient Medications (Other)  Medication Sig   amLODipine (NORVASC) 10 MG tablet Take 10 mg by mouth daily.   aspirin EC 81 MG tablet Take 81 mg by mouth daily.   cefpodoxime (VANTIN) 200 MG tablet    gabapentin (NEURONTIN) 100 MG capsule Take 2 capsules by mouth at bedtime.   insulin glargine (LANTUS) 100 UNIT/ML injection Inject 30 Units into the skin at bedtime.     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   Krill Oil 300 MG CAPS Take 300 mg by mouth daily.   lidocaine (LIDODERM) 5 % Place 1 patch onto the skin daily. Remove & Discard patch within 12 hours or as directed by MD   lisinopril-hydrochlorothiazide (ZESTORETIC) 20-25 MG tablet Take 2 tablets by mouth every morning.   metFORMIN (GLUCOPHAGE) 1000 MG tablet Take 1,000 mg by mouth 2 (two) times daily with a meal.   pravastatin (PRAVACHOL) 20 MG tablet Take 20 mg by mouth at bedtime.   fluticasone (FLONASE) 50 MCG/ACT nasal spray Place 2 sprays into both nostrils daily as needed for allergies or rhinitis.   gabapentin (NEURONTIN) 100 MG capsule Take 1 capsule by mouth 3 (three) times daily.   HYDROcodone-acetaminophen (NORCO/VICODIN) 5-325 MG tablet Take 1-2 tablets by mouth every 4 (four) hours as needed for moderate pain.   lisinopril (ZESTRIL) 20 MG tablet Take 2 tablets by mouth daily. (Patient not taking: Reported on 04/19/2021)   nitroGLYCERIN (NITROSTAT) 0.4 MG SL tablet Place 0.4 mg under the tongue every 5 (five) minutes as needed for chest pain.   PRECISION XTRA TEST STRIPS test strip    No current facility-administered medications for this visit. (Other)   REVIEW OF SYSTEMS: ROS  Positive for: Genitourinary, Endocrine, Cardiovascular, Eyes Negative for: Constitutional, Gastrointestinal, Neurological, Skin, Musculoskeletal, HENT, Respiratory, Psychiatric, Allergic/Imm, Heme/Lymph Last edited by Leonie Douglas, COA on 04/19/2021  9:02 AM.      ALLERGIES No Known Allergies  PAST MEDICAL HISTORY Past Medical History:  Diagnosis Date   Anemia    Bradycardia    CAD (coronary artery disease)    Chest tightness    Chronic kidney disease    Diabetes mellitus without complication (HCC)    Diabetic retinopathy ([place])    Dyslipidemia    Dyspnea on  exertion    Glaucoma    Hypertension    Obesity    Paroxysmal atrial fibrillation ([place])    a. identified on device interrogation   Pneumonia    Past Surgical History:  Procedure Laterality Date   ACHILLES TENDON SURGERY     for rupture   CARDIAC CATHETERIZATION  07/08/2009   mild to mod . prox LAD 50% (Dr. Felton Clinton)   CATARACT EXTRACTION     CHOLECYSTECTOMY N/A 10/11/2017   Procedure: LAPAROSCOPIC CHOLECYSTECTOMY WITH INTRAOPERATIVE CHOLANGIOGRAM;  Surgeon: Alphonsa Overall, MD;  Location: [facility];  Service: General;  Laterality: N/A;   DOPPLER ECHOCARDIOGRAPHY  04/10/2008   LVEF 70-80% ,norm   EYE SURGERY     PACEMAKER IMPLANT N/A 10/12/2017   Procedure: PACEMAKER IMPLANT;  Surgeon: Evans Lance, MD;  Location: [facility];  Service: Cardiovascular;  Laterality: N/A;   stress myoview  04/09/2008   EF 54%; LV  norm   TEMPORARY PACEMAKER N/A 10/11/2017   Procedure: TEMPORARY PACEMAKER;  Surgeon: Evans Lance, MD;  Location: [facility];  Service: Cardiovascular;  Laterality: N/A;   FAMILY HISTORY Family History  Problem Relation Age of Onset   Leukemia Mother    SOCIAL HISTORY Social History   Tobacco Use   Smoking status: Never   Smokeless tobacco: Never  Vaping Use   Vaping Use: Never used  Substance Use Topics   Alcohol use: No    Alcohol/week: 0.0 standard drinks   Drug use: No       OPHTHALMIC EXAM:  Base Eye Exam     Visual Acuity (Snellen - Linear)       Right Left   Dist cc 20/25 20/25   Dist ph cc NI NI    Correction: Glasses         Tonometry (Tonopen, 9:11 AM)       Right Left   Pressure 30 19         Tonometry #2 (Applanation, 9:14 AM)       Right Left   Pressure 32          Tonometry #3 (Tonopen, 9:37 AM)       Right Left   Pressure 26          Tonometry Comments   Instilled in office 1 drop Brimonidine _0 :17 1 drop Cosopt _1 :23        Pupils       Dark Light Shape React APD   Right 2 1 Round  Brisk None   Left 2 1 Round Brisk None         Visual Fields (Counting fingers)       Left Right    Full Full         Extraocular Movement       Right Left    Full Full         Neuro/Psych     Oriented  x3: Yes   Mood/Affect: Normal         Dilation     Both eyes: 1.0% Mydriacyl, 2.5% Phenylephrine @ 9:14 AM           Slit Lamp and Fundus Exam     Slit Lamp Exam       Right Left   Lids/Lashes Dermatochalasis - upper lid, mild MGD Dermatochalasis - upper lid, mild MGD   Conjunctiva/Sclera nasal pingeucula, Melanosis nasal pingeucula, Melanosis   Cornea 2-3+ Punctate epithelial erosions, well healed cataract wound 2-3+ Punctate epithelial erosions, well healed cataract wound   Anterior Chamber Deep and quiet Deep and quiet   Iris round and poorly dilated to 3m, No NVI round and poorly dilated to 447m No NVI   Lens PC IOL in good position PC IOL in good position   Anterior Vitreous Mild Vitreous syneresis, Posterior vitreous detachment, blood stained vitreous condensations inferiorly -- persistent / slightly increased Mild Vitreous syneresis, Posterior vitreous detachment, vitreous condensations         Fundus Exam       Right Left   Disc mild Pallor, Sharp rim, +cupping mild Pallor, Sharp rim, +cupping, focal PPP temporal   C/D Ratio 0.6 0.6   Macula Flat, Blunted foveal reflex, scattered Microaneurysms Flat, Blunted foveal reflex, scattered MA   Vessels attenuated, Tortuous attenuated, Tortuous, no obvious NV, Copper wiring   Periphery Attached, +NV along inferior arcades, +NV nasal midzone, 360 MA/DBH, pre-retinal hemes settled inferiorly Attached, 360 MA/DBH, focal exudates nasal midzone           Refraction     Wearing Rx       Sphere Cylinder Axis Add   Right -0.75 +1.25 004 +2.75   Left -0.75 +1.25 004 +2.75    Type: PAL            IMAGING AND PROCEDURES  Imaging and Procedures for 04/19/2021  OCT, Retina - OU - Both Eyes        Right Eye Quality was good. Central Foveal Thickness: 235. Progression has improved. Findings include abnormal foveal contour, no IRF, no SRF, epiretinal membrane, preretinal fibrosis (Mild improvement in cystic changes; persistent vitreous opacities-slightly increased ).   Left Eye Quality was good. Central Foveal Thickness: 265. Progression has improved. Findings include normal foveal contour, intraretinal fluid, intraretinal hyper-reflective material, no SRF (Mild improvement in cystic changes temporal macula).   Notes *Images captured and stored on drive  Diagnosis / Impression:  OD: Mild improvement in cystic changes, persistent vitreous opacities, slightly increased  OS: Mild improvement in cystic changes temporal macula  Clinical management:  See below  Abbreviations: NFP - Normal foveal profile. CME - cystoid macular edema. PED - pigment epithelial detachment. IRF - intraretinal fluid. SRF - subretinal fluid. EZ - ellipsoid zone. ERM - epiretinal membrane. ORA - outer retinal atrophy. ORT - outer retinal tubulation. SRHM - subretinal hyper-reflective material. IRHM - intraretinal hyper-reflective material      Intravitreal Injection, Pharmacologic Agent - OD - Right Eye       Time Out 04/19/2021. 9:37 AM. Confirmed correct patient, procedure, site, and patient consented.   Anesthesia Topical anesthesia was used. Anesthetic medications included Lidocaine 2%, Proparacaine 0.5%.   Procedure Preparation included 5% betadine to ocular surface, eyelid speculum. A (32g) needle was used.   Injection: 1.25 mg Bevacizumab 1.2565m.05ml   Route: Intravitreal, Site: Right Eye   NDC: [id number]: 2: [id number] date: 05/13/2021, Waste: 0.05 mL   Post-op Post injection exam  found visual acuity of at least counting fingers. The patient tolerated the procedure well. There were no complications. The patient received written and verbal post procedure care education. Post  injection medications were not given.   Notes An AC tap was performed following injection due to elevated IOP using a 30 gauge needle on a syringe with the plunger removed. The needle was placed at the limbus at 5 oclock and approximately 0.07 cc of aqueous was removed from the anterior chamber. Betadine was applied to the tap area before and after the paracentesis was performed. There were no complications. The patient tolerated the procedure well.             ASSESSMENT/PLAN:    ICD-10-CM   1. Proliferative diabetic retinopathy of right eye without macular edema associated with type 2 diabetes mellitus (HCC)  E11.3591 OCT, Retina - OU - Both Eyes    Intravitreal Injection, Pharmacologic Agent - OD - Right Eye    Bevacizumab (AVASTIN) SOLN 1.25 mg    2. Vitreous hemorrhage, right eye (HCC)  H43.11     3. Severe nonproliferative diabetic retinopathy of left eye with macular edema associated with type 2 diabetes mellitus ([place])  R15.4008     4. Epiretinal membrane (ERM) of right eye  H35.371     5. Essential hypertension  I10     6. Hypertensive retinopathy of both eyes  H35.033     7. Pseudophakia, both eyes  Z96.1        1,2. Proliferative diabetic retinopathy w/ vitreous hemorrhage, OD   - s/p IVA OD #1 (12.19.22)  - referred by [facility] -- formerly followed by Dr. Anderson Malta - BCVA 20/25 - exam shows +NV along inferior arcades and nasal midzone - FA today (12.19.22) shows +NV along inferior arcades, focal NV nasal midzone, extensive vascular non-perfusion inferior periphery - OCT shows OD: Mild improvement in cystic changes, persistent vitreous opacities, slightly increased; OS: Mild improvement in cystic changes temporal macula - discussed findings, prognosis and treatment options - will need PRP OD for NVE, but recommend IVA OD #2 today, 01.16.23 for [facility] and PDR - pt wishes to proceed with injection - RBA of procedure discussed, questions answered - IVA informed consent obtained  and signed, 12.19.22 - see procedure note - VH precautions reviewed -- minimize activities, keep head elevated, avoid ASA/NSAIDs/blood thinners as able  - f/u 4 weeks, DFE, OCT, possible injection  3. Severe nonproliferative diabetic retinopathy w/ DME, left eye  - BCVA 20/25 OS - exam shows scattered MA, DBH, no NVOS  - FA (12.19.22) shows no NV, vascular non-perfusion temporal periphery with perivascular leakage - OCT with +cystic changes temporal mac, left eye  - monitor for now  4. Epiretinal membrane, right eye  - mild ERM OD - BCVA 20/25 - asymptomatic, no metamorphopsia - no indication for surgery at this time - monitor for now  5,6. Hypertensive retinopathy OU - discussed importance of tight BP control - monitor  7. Pseudophakia OU  - s/p CE/IOL OU  - IOLs in good position, doing well  - monitor   Ophthalmic Meds Ordered this visit:  Meds ordered this encounter  Medications   Bevacizumab (AVASTIN) SOLN 1.25 mg     Return in about 4 weeks (around 05/17/2021) for f/u PDR OU, DFE, OCT.  There are no Patient Instructions on file for this visit.   Explained the diagnoses, plan, and follow up with the patient and they expressed understanding.  Patient expressed understanding of the  importance of proper follow up care.   This document serves as a record of services personally performed by Gardiner Sleeper, MD, PhD. It was created on their behalf by Roselee Nova, COMT. The creation of this record is the provider's dictation and/or activities during the visit.  Electronically signed by: Roselee Nova, COMT 04/19/21 12:30 PM  This document serves as a record of services personally performed by Gardiner Sleeper, MD, PhD. It was created on their behalf by San Jetty. Owens Shark, OA an ophthalmic technician. The creation of this record is the provider's dictation and/or activities during the visit.    Electronically signed by: San Jetty. Owens Shark, [place] 01.16.2023 12:30 PM  Gardiner Sleeper, M.D., Ph.D. Diseases & Surgery of the Retina and Vitreous [facility]  I have reviewed the above documentation for accuracy and completeness, and I agree with the above. Gardiner Sleeper, M.D., Ph.D. 04/19/21 12:31 PM   Abbreviations: M myopia (nearsighted); A astigmatism; H hyperopia (farsighted); P presbyopia; Mrx spectacle prescription;  CTL contact lenses; OD right eye; OS left eye; OU both eyes  XT exotropia; ET esotropia; PEK punctate epithelial keratitis; PEE punctate epithelial erosions; DES dry eye syndrome; MGD meibomian gland dysfunction; ATs artificial tears; PFAT's preservative free artificial tears; [place] nuclear sclerotic cataract; PSC posterior subcapsular cataract; ERM epi-retinal membrane; PVD posterior vitreous detachment; RD retinal detachment; DM diabetes mellitus; DR diabetic retinopathy; NPDR non-proliferative diabetic retinopathy; PDR proliferative diabetic retinopathy; CSME clinically significant macular edema; DME diabetic macular edema; dbh dot blot hemorrhages; CWS cotton wool spot; POAG primary open angle glaucoma; C/D cup-to-disc ratio; HVF humphrey visual field; GVF goldmann visual field; OCT optical coherence tomography; IOP intraocular pressure; BRVO Branch retinal vein occlusion; CRVO central retinal vein occlusion; CRAO central retinal artery occlusion; BRAO branch retinal artery occlusion; RT retinal tear; SB scleral buckle; PPV pars plana vitrectomy; VH Vitreous hemorrhage; PRP panretinal laser photocoagulation; IVK intravitreal kenalog; VMT vitreomacular traction; MH Macular hole;  NVD neovascularization of the disc; NVE neovascularization elsewhere; AREDS age related eye disease study; ARMD age related macular degeneration; POAG primary open angle glaucoma; EBMD epithelial/anterior basement membrane dystrophy; ACIOL anterior chamber intraocular lens; IOL intraocular lens; PCIOL posterior chamber intraocular lens; Phaco/IOL phacoemulsification  with intraocular lens placement; [place] photorefractive keratectomy; LASIK laser assisted in situ keratomileusis; HTN hypertension; DM diabetes mellitus; COPD chronic obstructive pulmonary disease

## 2021-04-19 ENCOUNTER — Other Ambulatory Visit: Payer: Self-pay

## 2021-04-19 ENCOUNTER — Ambulatory Visit (INDEPENDENT_AMBULATORY_CARE_PROVIDER_SITE_OTHER): Payer: No Typology Code available for payment source | Admitting: Ophthalmology

## 2021-04-19 ENCOUNTER — Encounter (INDEPENDENT_AMBULATORY_CARE_PROVIDER_SITE_OTHER): Payer: Self-pay | Admitting: Ophthalmology

## 2021-04-19 DIAGNOSIS — H35371 Puckering of macula, right eye: Secondary | ICD-10-CM

## 2021-04-19 DIAGNOSIS — Z961 Presence of intraocular lens: Secondary | ICD-10-CM

## 2021-04-19 DIAGNOSIS — H4311 Vitreous hemorrhage, right eye: Secondary | ICD-10-CM | POA: Diagnosis not present

## 2021-04-19 DIAGNOSIS — E113412 Type 2 diabetes mellitus with severe nonproliferative diabetic retinopathy with macular edema, left eye: Secondary | ICD-10-CM | POA: Diagnosis not present

## 2021-04-19 DIAGNOSIS — E113591 Type 2 diabetes mellitus with proliferative diabetic retinopathy without macular edema, right eye: Secondary | ICD-10-CM | POA: Diagnosis not present

## 2021-04-19 DIAGNOSIS — H35033 Hypertensive retinopathy, bilateral: Secondary | ICD-10-CM

## 2021-04-19 DIAGNOSIS — I10 Essential (primary) hypertension: Secondary | ICD-10-CM

## 2021-04-19 MED ORDER — BEVACIZUMAB CHEMO INJECTION 1.25MG/0.05ML SYRINGE FOR KALEIDOSCOPE
1.2500 mg | INTRAVITREAL | Status: AC | PRN
  Administered 2021-04-19: 1.25 mg via INTRAVITREAL

## 2021-05-13 NOTE — Progress Notes (Signed)
[facility] Note  05/20/2021    CHIEF COMPLAINT Patient presents for Retina Follow Up   HISTORY OF PRESENT ILLNESS: Matthew Sellers is a 81 y.o. male who presents to the clinic today for:   HPI     Retina Follow Up   Patient presents with  Diabetic Retinopathy.  In both eyes.  This started 4 weeks ago.  I, the attending physician,  performed the HPI with the patient and updated documentation appropriately.        Comments   Patient here for 4 weeks retina follow up for PDR OU. Patient states vision off and on it is blurry. 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 OD had stabbing eye pain for about 15 20 minutes not persistent.       Last edited by Bernarda Caffey, MD on 05/20/2021  3:27 PM.    Pt states he is taking 3 drops, he is not having problems with floaters right now  Referring physician: No referring provider defined for this encounter.  HISTORICAL INFORMATION:   Selected notes from the MEDICAL RECORD NUMBER [facility] LEE:  Ocular Hx- PMH-    CURRENT MEDICATIONS: Current Outpatient Medications (Ophthalmic Drugs)  Medication Sig   Brinzolamide-Brimonidine 1-0.2 % SUSP Place 1 drop into both eyes 2 (two) times daily.   carboxymethylcellulose (REFRESH PLUS) 0.5 % SOLN INSTILL 1 DROP IN BOTH EYES FIVE TIMES A DAY   latanoprost (XALATAN) 0.005 % ophthalmic solution Place 1 drop into both eyes at bedtime.   Latanoprost 0.005 % EMUL    timolol (TIMOPTIC-XR) 0.5 % ophthalmic gel-forming SMARTSIG:In Eye(s)   No current facility-administered medications for this visit. (Ophthalmic Drugs)   Current Outpatient Medications (Other)  Medication Sig   amLODipine (NORVASC) 10 MG tablet Take 10 mg by mouth daily.   aspirin EC 81 MG tablet Take 81 mg by mouth daily.   cefpodoxime (VANTIN) 200 MG tablet    gabapentin (NEURONTIN) 100 MG capsule Take 2 capsules by mouth at bedtime.   insulin glargine (LANTUS) 100 UNIT/ML  injection Inject 30 Units into the skin at bedtime.    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   Krill Oil 300 MG CAPS Take 300 mg by mouth daily.   lidocaine (LIDODERM) 5 % Place 1 patch onto the skin daily. Remove & Discard patch within 12 hours or as directed by MD   lisinopril-hydrochlorothiazide (ZESTORETIC) 20-25 MG tablet Take 2 tablets by mouth every morning.   metFORMIN (GLUCOPHAGE) 1000 MG tablet Take 1,000 mg by mouth 2 (two) times daily with a meal.   pravastatin (PRAVACHOL) 20 MG tablet Take 20 mg by mouth at bedtime.   PRECISION XTRA TEST STRIPS test strip    fluticasone (FLONASE) 50 MCG/ACT nasal spray Place 2 sprays into both nostrils daily as needed for allergies or rhinitis.   gabapentin (NEURONTIN) 100 MG capsule Take 1 capsule by mouth 3 (three) times daily.   HYDROcodone-acetaminophen (NORCO/VICODIN) 5-325 MG tablet Take 1-2 tablets by mouth every 4 (four) hours as needed for moderate pain.   lisinopril (ZESTRIL) 20 MG tablet Take 2 tablets by mouth daily. (Patient not taking: Reported on 04/19/2021)   nitroGLYCERIN (NITROSTAT) 0.4 MG SL tablet Place 0.4 mg under the tongue every 5 (five) minutes as needed for chest pain.   No current  facility-administered medications for this visit. (Other)   REVIEW OF SYSTEMS: ROS   Positive for: Genitourinary, Endocrine, Cardiovascular, Eyes Negative for: Constitutional, Gastrointestinal, Neurological, Skin, Musculoskeletal, HENT, Respiratory, Psychiatric, Allergic/Imm, Heme/Lymph Last edited by Theodore Demark, COA on 05/20/2021  8:36 AM.     ALLERGIES No Known Allergies  PAST MEDICAL HISTORY Past Medical History:  Diagnosis Date   Anemia    Bradycardia    CAD (coronary artery disease)    Chest tightness    Chronic kidney disease    Diabetes mellitus without complication (HCC)     Diabetic retinopathy ([place])    Dyslipidemia    Dyspnea on exertion    Glaucoma    Hypertension    Obesity    Paroxysmal atrial fibrillation ([place])    a. identified on device interrogation   Pneumonia    Past Surgical History:  Procedure Laterality Date   ACHILLES TENDON SURGERY     for rupture   CARDIAC CATHETERIZATION  07/08/2009   mild to mod . prox LAD 50% (Dr. Felton Clinton)   CATARACT EXTRACTION     CHOLECYSTECTOMY N/A 10/11/2017   Procedure: LAPAROSCOPIC CHOLECYSTECTOMY WITH INTRAOPERATIVE CHOLANGIOGRAM;  Surgeon: Alphonsa Overall, MD;  Location: [facility];  Service: General;  Laterality: N/A;   DOPPLER ECHOCARDIOGRAPHY  04/10/2008   LVEF 70-80% ,norm   EYE SURGERY     PACEMAKER IMPLANT N/A 10/12/2017   Procedure: PACEMAKER IMPLANT;  Surgeon: Evans Lance, MD;  Location: [facility];  Service: Cardiovascular;  Laterality: N/A;   stress myoview  04/09/2008   EF 54%; LV  norm   TEMPORARY PACEMAKER N/A 10/11/2017   Procedure: TEMPORARY PACEMAKER;  Surgeon: Evans Lance, MD;  Location: [facility];  Service: Cardiovascular;  Laterality: N/A;   FAMILY HISTORY Family History  Problem Relation Age of Onset   Leukemia Mother    SOCIAL HISTORY Social History   Tobacco Use   Smoking status: Never   Smokeless tobacco: Never  Vaping Use   Vaping Use: Never used  Substance Use Topics   Alcohol use: No    Alcohol/week: 0.0 standard drinks   Drug use: No       OPHTHALMIC EXAM:  Base Eye Exam     Visual Acuity (Snellen - Linear)       Right Left   Dist cc 20/25 -2 20/25 -2   Dist ph cc 20/20 -2 20/20 -2    Correction: Glasses         Tonometry (Tonopen, 8:32 AM)       Right Left   Pressure 19 14         Pupils       Dark Light Shape React APD   Right 2 1 Round Brisk None   Left 2 1 Round Brisk None         Visual Fields (Counting fingers)       Left Right    Full Full         Extraocular Movement       Right Left    Full, Ortho  Full, Ortho         Neuro/Psych     Oriented x3: Yes   Mood/Affect: Normal         Dilation     Both eyes: 1.0% Mydriacyl, 2.5% Phenylephrine @ 8:32 AM           Slit Lamp and Fundus Exam     Slit Lamp Exam  Right Left   Lids/Lashes Dermatochalasis - upper lid, mild MGD Dermatochalasis - upper lid, mild MGD   Conjunctiva/Sclera nasal pingeucula, Melanosis nasal pingeucula, Melanosis   Cornea 2-3+ Punctate epithelial erosions, well healed cataract wound 2-3+ Punctate epithelial erosions, well healed cataract wound   Anterior Chamber Deep and quiet Deep and quiet   Iris round and poorly dilated to 53m, No NVI round and poorly dilated to 458m No NVI   Lens PC IOL in good position PC IOL in good position   Anterior Vitreous Mild Vitreous syneresis, Posterior vitreous detachment, blood stained vitreous condensations inferiorly -- persistent / slightly improved Mild Vitreous syneresis, Posterior vitreous detachment, vitreous condensations         Fundus Exam       Right Left   Disc mild Pallor, Sharp rim, +cupping mild Pallor, Sharp rim, +cupping, focal PPP temporal   C/D Ratio 0.6 0.6   Macula Flat, Blunted foveal reflex, scattered Microaneurysms, trace cystic changes Flat, Blunted foveal reflex, scattered MA   Vessels attenuated, Tortuous attenuated, Tortuous, no obvious NV, Copper wiring   Periphery Attached, +NV along inferior arcades, +NV nasal midzone - improving, 360 MA/DBH, pre-retinal hemes settled inferiorly Attached, 360 MA/DBH, focal exudates nasal midzone           Refraction     Wearing Rx       Sphere Cylinder Axis Add   Right -0.75 +1.25 004 +2.75   Left -0.75 +1.25 004 +2.75    Type: PAL           IMAGING AND PROCEDURES  Imaging and Procedures for 05/20/2021  OCT, Retina - OU - Both Eyes       Right Eye Quality was good. Central Foveal Thickness: 282. Progression has improved. Findings include abnormal foveal contour, no SRF,  epiretinal membrane, preretinal fibrosis, intraretinal fluid (Mild interval improvement in vitreous opacities, Persistent cystic changes greatest temporal macula).   Left Eye Quality was good. Central Foveal Thickness: 265. Progression has improved. Findings include normal foveal contour, intraretinal fluid, intraretinal hyper-reflective material, no SRF (Mild improvement in cystic changes temporal macula).   Notes *Images captured and stored on drive  Diagnosis / Impression:  OD: Mild interval improvement in vitreous opacities, Persistent cystic changes greatest temporal macula OS: Mild improvement in cystic changes temporal macula  Clinical management:  See below  Abbreviations: NFP - Normal foveal profile. CME - cystoid macular edema. PED - pigment epithelial detachment. IRF - intraretinal fluid. SRF - subretinal fluid. EZ - ellipsoid zone. ERM - epiretinal membrane. ORA - outer retinal atrophy. ORT - outer retinal tubulation. SRHM - subretinal hyper-reflective material. IRHM - intraretinal hyper-reflective material      Intravitreal Injection, Pharmacologic Agent - OD - Right Eye       Time Out 05/20/2021. 9:12 AM. Confirmed correct patient, procedure, site, and patient consented.   Anesthesia Topical anesthesia was used. Anesthetic medications included Lidocaine 2%, Proparacaine 0.5%.   Procedure Preparation included 5% betadine to ocular surface, eyelid speculum. A supplied needle was used.   Injection: 1.25 mg Bevacizumab 1.2553m.05ml   Route: Intravitreal, Site: Right Eye   NDC: 50242-060-01, Lot: 3: [id number] date: 07/03/2021   Post-op Post injection exam found visual acuity of at least counting fingers. The patient tolerated the procedure well. There were no complications. The patient received written and verbal post procedure care education. Post injection medications were not given.            ASSESSMENT/PLAN:    ICD-10-CM  1. Proliferative diabetic  retinopathy of right eye with macular edema associated with type 2 diabetes mellitus (HCC)  E11.3511 OCT, Retina - OU - Both Eyes    Intravitreal Injection, Pharmacologic Agent - OD - Right Eye    Bevacizumab (AVASTIN) SOLN 1.25 mg    2. Vitreous hemorrhage, right eye (HCC)  H43.11     3. Severe nonproliferative diabetic retinopathy of left eye with macular edema associated with type 2 diabetes mellitus ([place])  J62.8315     4. Epiretinal membrane (ERM) of right eye  H35.371     5. Essential hypertension  I10     6. Hypertensive retinopathy of both eyes  H35.033     7. Pseudophakia, both eyes  Z96.1      1,2. Proliferative diabetic retinopathy w/ vitreous hemorrhage, OD   - s/p IVA OD #1 (12.19.22), #2 (01.16.23)  - referred by [facility] -- formerly followed by Dr. Anderson Malta - BCVA 20/20 - improved - exam shows +NV along inferior arcades and nasal midzone - FA (12.19.22) shows +NV along inferior arcades, focal NV nasal midzone, extensive vascular non-perfusion inferior periphery - OCT today shows OD: Mild interval improvement in vitreous opacities, Persistent cystic changes greatest temporal macula; OS: Mild improvement in cystic changes temporal macula - discussed findings, prognosis and treatment options - will need PRP OD for NVE, but recommend IVA OD #3 today, 02.16.23 for [facility] and PDR w/ DME - pt wishes to proceed with injection - RBA of procedure discussed, questions answered - IVA informed consent obtained and signed, 12.19.22 - see procedure note - VH precautions reviewed -- minimize activities, keep head elevated, avoid ASA/NSAIDs/blood thinners as able  - f/u February 27, 815, DFE, OCT, PRP OD  3. Severe nonproliferative diabetic retinopathy w/ DME, left eye  - BCVA 20/20 OS - improved - exam shows scattered MA, DBH, no NVOS  - FA (12.19.22) shows no NV, vascular non-perfusion temporal periphery with perivascular leakage - OCT with +cystic changes temporal mac, left eye  - monitor  for now  4. Epiretinal membrane, right eye  - mild ERM OD - BCVA 20/25 - asymptomatic, no metamorphopsia - no indication for surgery at this time - monitor for now  5,6. Hypertensive retinopathy OU - discussed importance of tight BP control - monitor  7. Pseudophakia OU  - s/p CE/IOL OU  - IOLs in good position, doing well  - monitor   Ophthalmic Meds Ordered this visit:  Meds ordered this encounter  Medications   Bevacizumab (AVASTIN) SOLN 1.25 mg     Return in about 11 days (around 05/31/2021) for 815 am, Dilated Exam, OCT, Laser PRP OD.  There are no Patient Instructions on file for this visit.   Explained the diagnoses, plan, and follow up with the patient and they expressed understanding.  Patient expressed understanding of the importance of proper follow up care.   This document serves as a record of services personally performed by Gardiner Sleeper, MD, PhD. It was created on their behalf by San Jetty. Owens Shark, OA an ophthalmic technician. The creation of this record is the provider's dictation and/or activities during the visit.    Electronically signed by: San Jetty. Owens Shark, [place] 02.09.2023 3:30 PM   Gardiner Sleeper, M.D., Ph.D. Diseases & Surgery of the Retina and Vitreous [facility]  I have reviewed the above documentation for accuracy and completeness, and I agree with the above. Gardiner Sleeper, M.D., Ph.D. 05/20/21 3:30 PM  Abbreviations: M myopia (nearsighted); A astigmatism; H hyperopia (farsighted); P presbyopia; Mrx spectacle prescription;  CTL contact lenses; OD right eye; OS left eye; OU both eyes  XT exotropia; ET esotropia; PEK punctate epithelial keratitis; PEE punctate epithelial erosions; DES dry eye syndrome; MGD meibomian gland dysfunction; ATs artificial tears; PFAT's preservative free artificial tears; [place] nuclear sclerotic cataract; PSC posterior subcapsular cataract; ERM epi-retinal membrane; PVD posterior vitreous  detachment; RD retinal detachment; DM diabetes mellitus; DR diabetic retinopathy; NPDR non-proliferative diabetic retinopathy; PDR proliferative diabetic retinopathy; CSME clinically significant macular edema; DME diabetic macular edema; dbh dot blot hemorrhages; CWS cotton wool spot; POAG primary open angle glaucoma; C/D cup-to-disc ratio; HVF humphrey visual field; GVF goldmann visual field; OCT optical coherence tomography; IOP intraocular pressure; BRVO Branch retinal vein occlusion; CRVO central retinal vein occlusion; CRAO central retinal artery occlusion; BRAO branch retinal artery occlusion; RT retinal tear; SB scleral buckle; PPV pars plana vitrectomy; VH Vitreous hemorrhage; PRP panretinal laser photocoagulation; IVK intravitreal kenalog; VMT vitreomacular traction; MH Macular hole;  NVD neovascularization of the disc; NVE neovascularization elsewhere; AREDS age related eye disease study; ARMD age related macular degeneration; POAG primary open angle glaucoma; EBMD epithelial/anterior basement membrane dystrophy; ACIOL anterior chamber intraocular lens; IOL intraocular lens; PCIOL posterior chamber intraocular lens; Phaco/IOL phacoemulsification with intraocular lens placement; [place] photorefractive keratectomy; LASIK laser assisted in situ keratomileusis; HTN hypertension; DM diabetes mellitus; COPD chronic obstructive pulmonary disease

## 2021-05-20 ENCOUNTER — Ambulatory Visit (INDEPENDENT_AMBULATORY_CARE_PROVIDER_SITE_OTHER): Payer: No Typology Code available for payment source | Admitting: Ophthalmology

## 2021-05-20 ENCOUNTER — Other Ambulatory Visit: Payer: Self-pay

## 2021-05-20 ENCOUNTER — Encounter (INDEPENDENT_AMBULATORY_CARE_PROVIDER_SITE_OTHER): Payer: Self-pay | Admitting: Ophthalmology

## 2021-05-20 DIAGNOSIS — H35371 Puckering of macula, right eye: Secondary | ICD-10-CM | POA: Diagnosis not present

## 2021-05-20 DIAGNOSIS — E113591 Type 2 diabetes mellitus with proliferative diabetic retinopathy without macular edema, right eye: Secondary | ICD-10-CM

## 2021-05-20 DIAGNOSIS — E113412 Type 2 diabetes mellitus with severe nonproliferative diabetic retinopathy with macular edema, left eye: Secondary | ICD-10-CM | POA: Diagnosis not present

## 2021-05-20 DIAGNOSIS — E113511 Type 2 diabetes mellitus with proliferative diabetic retinopathy with macular edema, right eye: Secondary | ICD-10-CM

## 2021-05-20 DIAGNOSIS — H4311 Vitreous hemorrhage, right eye: Secondary | ICD-10-CM | POA: Diagnosis not present

## 2021-05-20 DIAGNOSIS — Z961 Presence of intraocular lens: Secondary | ICD-10-CM

## 2021-05-20 DIAGNOSIS — H35033 Hypertensive retinopathy, bilateral: Secondary | ICD-10-CM

## 2021-05-20 DIAGNOSIS — I10 Essential (primary) hypertension: Secondary | ICD-10-CM

## 2021-05-20 MED ORDER — BEVACIZUMAB CHEMO INJECTION 1.25MG/0.05ML SYRINGE FOR KALEIDOSCOPE
1.2500 mg | INTRAVITREAL | Status: AC | PRN
  Administered 2021-05-20: 1.25 mg via INTRAVITREAL

## 2021-05-25 NOTE — Progress Notes (Signed)
[facility] Note  05/31/2021    CHIEF COMPLAINT Patient presents for Retina Follow Up   HISTORY OF PRESENT ILLNESS: Matthew Sellers is a 81 y.o. male who presents to the clinic today for:   HPI     Retina Follow Up   Patient presents with  Diabetic Retinopathy.  In both eyes.  Duration of 1 week.  Since onset it is stable.  I, the attending physician,  performed the HPI with the patient and updated documentation appropriately.        Comments   1 1/2 week follow up PDR OD, NPDR OS- Doing well, vision appears stable OU. Using Latanoprost qhs OU, Timolol Gel qam OU, Simbrinza BID OU. BS 148 yesterday, A1C unsure      Last edited by Bernarda Caffey, MD on 05/31/2021  9:58 AM.     Pt states  Referring physician: No referring provider defined for this encounter.  HISTORICAL INFORMATION:   Selected notes from the MEDICAL RECORD NUMBER [facility] LEE:  Ocular Hx- PMH-    CURRENT MEDICATIONS: Current Outpatient Medications (Ophthalmic Drugs)  Medication Sig   Brinzolamide-Brimonidine 1-0.2 % SUSP Place 1 drop into both eyes 2 (two) times daily.   carboxymethylcellulose (REFRESH PLUS) 0.5 % SOLN INSTILL 1 DROP IN BOTH EYES FIVE TIMES A DAY   latanoprost (XALATAN) 0.005 % ophthalmic solution Place 1 drop into both eyes at bedtime.   timolol (TIMOPTIC-XR) 0.5 % ophthalmic gel-forming SMARTSIG:In Eye(s)   Latanoprost 0.005 % EMUL    No current facility-administered medications for this visit. (Ophthalmic Drugs)   Current Outpatient Medications (Other)  Medication Sig   amLODipine (NORVASC) 10 MG tablet Take 10 mg by mouth daily.   aspirin EC 81 MG tablet Take 81 mg by mouth daily.   cefpodoxime (VANTIN) 200 MG tablet    gabapentin (NEURONTIN) 100 MG capsule Take 2 capsules by mouth at bedtime.   insulin glargine (LANTUS) 100 UNIT/ML injection Inject 30 Units into the skin at bedtime.    insulin glargine-yfgn (SEMGLEE) 100  UNIT/ML Pen INJECT 36 UNITS SUBCUTANEOUSLY DAILY FOR DIABETES (CONVERTED FROM LANTUS)   JARDIANCE 25 MG TABS tablet Take 1 tablet by mouth daily.   Krill Oil 300 MG CAPS Take 300 mg by mouth daily.   lidocaine (LIDODERM) 5 % Place 1 patch onto the skin daily. Remove & Discard patch within 12 hours or as directed by MD   lisinopril-hydrochlorothiazide (ZESTORETIC) 20-25 MG tablet Take 2 tablets by mouth every morning.   metFORMIN (GLUCOPHAGE) 1000 MG tablet Take 1,000 mg by mouth 2 (two) times daily with a meal.   pravastatin (PRAVACHOL) 20 MG tablet Take 20 mg by mouth at bedtime.   PRECISION XTRA TEST STRIPS test strip    fluticasone (FLONASE) 50 MCG/ACT nasal spray Place 2 sprays into both nostrils daily as needed for allergies or rhinitis.   gabapentin (NEURONTIN) 100 MG capsule Take 1 capsule by mouth 3 (three) times daily.   HYDROcodone-acetaminophen (NORCO/VICODIN) 5-325 MG tablet Take 1-2 tablets by mouth every 4 (four) hours as needed for moderate pain.   lisinopril (ZESTRIL) 20 MG tablet Take 2 tablets by mouth daily. (Patient not taking: Reported on 04/19/2021)   nitroGLYCERIN (NITROSTAT) 0.4 MG SL tablet Place 0.4 mg under the tongue every 5 (five) minutes as needed for chest pain.   No current facility-administered medications for this visit. (Other)   REVIEW OF SYSTEMS: ROS   Positive for: Genitourinary,  Endocrine, Cardiovascular, Eyes Negative for: Constitutional, Gastrointestinal, Neurological, Skin, Musculoskeletal, HENT, Respiratory, Psychiatric, Allergic/Imm, Heme/Lymph Last edited by Leonie Douglas, COA on 05/31/2021  8:15 AM.      ALLERGIES No Known Allergies  PAST MEDICAL HISTORY Past Medical History:  Diagnosis Date   Anemia    Bradycardia    CAD (coronary artery disease)    Chest tightness    Chronic kidney disease    Diabetes mellitus without complication (HCC)    Diabetic retinopathy ([place])    Dyslipidemia    Dyspnea on exertion    Glaucoma    Hypertension     Obesity    Paroxysmal atrial fibrillation ([place])    a. identified on device interrogation   Pneumonia    Past Surgical History:  Procedure Laterality Date   ACHILLES TENDON SURGERY     for rupture   CARDIAC CATHETERIZATION  07/08/2009   mild to mod . prox LAD 50% (Dr. Felton Clinton)   CATARACT EXTRACTION     CHOLECYSTECTOMY N/A 10/11/2017   Procedure: LAPAROSCOPIC CHOLECYSTECTOMY WITH INTRAOPERATIVE CHOLANGIOGRAM;  Surgeon: Alphonsa Overall, MD;  Location: [facility];  Service: General;  Laterality: N/A;   DOPPLER ECHOCARDIOGRAPHY  04/10/2008   LVEF 70-80% ,norm   EYE SURGERY     PACEMAKER IMPLANT N/A 10/12/2017   Procedure: PACEMAKER IMPLANT;  Surgeon: Evans Lance, MD;  Location: [place];  Service: Cardiovascular;  Laterality: N/A;   stress myoview  04/09/2008   EF 54%; LV  norm   TEMPORARY PACEMAKER N/A 10/11/2017   Procedure: TEMPORARY PACEMAKER;  Surgeon: Evans Lance, MD;  Location: [facility];  Service: Cardiovascular;  Laterality: N/A;   FAMILY HISTORY Family History  Problem Relation Age of Onset   Leukemia Mother    SOCIAL HISTORY Social History   Tobacco Use   Smoking status: Never   Smokeless tobacco: Never  Vaping Use   Vaping Use: Never used  Substance Use Topics   Alcohol use: No    Alcohol/week: 0.0 standard drinks   Drug use: No       OPHTHALMIC EXAM:  Base Eye Exam     Visual Acuity (Snellen - Linear)       Right Left   Dist cc 20/25- 20/25   Dist ph cc 20/25+ NI    Correction: Glasses         Tonometry (Tonopen, 8:22 AM)       Right Left   Pressure 25 21         Pupils       Dark Light Shape React APD   Right 2 1 Round Brisk None   Left 2 1 Round Brisk None         Visual Fields (Counting fingers)       Left Right    Full Full         Extraocular Movement       Right Left    Full Full         Neuro/Psych     Oriented x3: Yes   Mood/Affect: Normal         Dilation     Both eyes: 1.0%  Mydriacyl, 2.5% Phenylephrine @ 8:22 AM           Slit Lamp and Fundus Exam     Slit Lamp Exam       Right Left   Lids/Lashes Dermatochalasis - upper lid, mild MGD Dermatochalasis - upper lid, mild MGD   Conjunctiva/Sclera nasal  pingeucula, Melanosis nasal pingeucula, Melanosis   Cornea 2-3+ Punctate epithelial erosions, well healed cataract wound 2-3+ Punctate epithelial erosions, well healed cataract wound   Anterior Chamber Deep and quiet Deep and quiet   Iris round and poorly dilated to 64m, No NVI round and poorly dilated to 471m No NVI   Lens PC IOL in good position PC IOL in good position   Anterior Vitreous Mild Vitreous syneresis, Posterior vitreous detachment, blood stained vitreous condensations inferiorly -- persistent / slightly improved Mild Vitreous syneresis, Posterior vitreous detachment, vitreous condensations         Fundus Exam       Right Left   Disc mild Pallor, Sharp rim, +cupping mild Pallor, Sharp rim, +cupping, focal PPP temporal   C/D Ratio 0.6 0.6   Macula Flat, Blunted foveal reflex, scattered Microaneurysms, trace cystic changes Flat, Blunted foveal reflex, scattered MA   Vessels attenuated, Tortuous attenuated, Tortuous, no obvious NV, Copper wiring   Periphery Attached, +NV along inferior arcades, +NV nasal midzone - improving, 360 MA/DBH, pre-retinal hemes settled inferiorly Attached, 360 MA/DBH, focal exudates nasal midzone           Refraction     Wearing Rx       Sphere Cylinder Axis Add   Right -0.75 +1.25 004 +2.75   Left -0.75 +1.25 004 +2.75    Type: PAL           IMAGING AND PROCEDURES  Imaging and Procedures for 05/31/2021  OCT, Retina - OU - Both Eyes       Right Eye Quality was good. Central Foveal Thickness: 284. Progression has improved. Findings include abnormal foveal contour, no SRF, epiretinal membrane, preretinal fibrosis, intraretinal fluid (Mild interval improvement in vitreous opacities, Persistent cystic  changes greatest temporal macula).   Left Eye Quality was good. Central Foveal Thickness: 267. Progression has improved. Findings include normal foveal contour, intraretinal fluid, intraretinal hyper-reflective material, no SRF (Mild improvement in IRF/IRHM temporal macula).   Notes *Images captured and stored on drive  Diagnosis / Impression:  OD: Mild interval improvement in vitreous opacities, Persistent cystic changes greatest temporal macula OS: Mild improvement in IRF/IRHM temporal macula  Clinical management:  See below  Abbreviations: NFP - Normal foveal profile. CME - cystoid macular edema. PED - pigment epithelial detachment. IRF - intraretinal fluid. SRF - subretinal fluid. EZ - ellipsoid zone. ERM - epiretinal membrane. ORA - outer retinal atrophy. ORT - outer retinal tubulation. SRHM - subretinal hyper-reflective material. IRHM - intraretinal hyper-reflective material      Panretinal Photocoagulation - OD - Right Eye       Time Out Confirmed correct patient, procedure, site, and patient consented.   Anesthesia Topical anesthesia was used. Anesthetic medications included Proparacaine 0.5%.   Notes LASER PROCEDURE NOTE  Diagnosis:   Proliferative Diabetic Retinopathy, RIGHT EYE  Procedure:  Pan-retinal photocoagulation using slit lamp laser, RIGHT EYE  Anesthesia:  Topical  Surgeon: BrBernarda CaffeyMD, PhD   Informed consent obtained, operative eye marked, and time out performed prior to initiation of laser.   Lumenis Smart532 slit lamp laser Pattern: 2x2 square, 3x3 square Power: 340 mW Duration: 40 msec  Spot size: 200 microns  # spots: 1200 spots -- mostly inferior and nasal quads  Complications: None.  Notes: vitreous heme obscuring view and preventing laser up take superiorly, temporally and scattered focal areas  RTC: Mar 16 or later -- DFE/OCT, possible injxn  Patient tolerated the procedure well and received written and verbal  post-procedure  care information/education.             ASSESSMENT/PLAN:    ICD-10-CM   1. Proliferative diabetic retinopathy of right eye with macular edema associated with type 2 diabetes mellitus (HCC)  E11.3511 OCT, Retina - OU - Both Eyes    Panretinal Photocoagulation - OD - Right Eye    2. Vitreous hemorrhage, right eye (HCC)  H43.11 Panretinal Photocoagulation - OD - Right Eye    3. Severe nonproliferative diabetic retinopathy of left eye with macular edema associated with type 2 diabetes mellitus ([place])  G95.6213     4. Epiretinal membrane (ERM) of right eye  H35.371     5. Essential hypertension  I10     6. Hypertensive retinopathy of both eyes  H35.033     7. Pseudophakia, both eyes  Z96.1       1,2. Proliferative diabetic retinopathy w/ vitreous hemorrhage, OD   - s/p IVA OD #1 (12.19.22), #2 (01.16.23), #3 (02.16.23)  - referred by [facility] -- formerly followed by Dr. Anderson Malta - BCVA 20/20 - improved - exam shows +NV along inferior arcades and nasal midzone -- improving - FA (12.19.22) shows +NV along inferior arcades, focal NV nasal midzone, extensive vascular non-perfusion inferior periphery - OCT today shows OD: Mild interval improvement in vitreous opacities, Persistent cystic changes greatest temporal macula; OS: Mild improvement in cystic changes temporal macula - discussed findings, prognosis and treatment options - recommend PRP OD for PDR / NVE - pt wishes to proceed with laser - RBA of procedure discussed, questions answered - IVA informed consent obtained and signed, 12.19.22 - see procedure note - VH precautions reviewed -- minimize activities, keep head elevated, avoid ASA/NSAIDs/blood thinners as able  - start Lotemax QID OD X5-7 days - f/u March 16 or later, possible injection  3. Severe nonproliferative diabetic retinopathy w/ DME, left eye  - BCVA 20/20 OS - improved - exam shows scattered MA, DBH, no NVOS  - FA (12.19.22) shows no NV, vascular non-perfusion  temporal periphery with perivascular leakage - OCT with +cystic changes temporal mac, left eye  - monitor for now  4. Epiretinal membrane, right eye  - mild ERM OD - BCVA 20/25 - asymptomatic, no metamorphopsia - no indication for surgery at this time - monitor for now  5,6. Hypertensive retinopathy OU - discussed importance of tight BP control - monitor  7. Pseudophakia OU  - s/p CE/IOL OU  - IOLs in good position, doing well  - monitor   Ophthalmic Meds Ordered this visit:  No orders of the defined types were placed in this encounter.    Return for f/u March 16 or later, PDR OU, DFE, OCT.  There are no Patient Instructions on file for this visit.   Explained the diagnoses, plan, and follow up with the patient and they expressed understanding.  Patient expressed understanding of the importance of proper follow up care.   This document serves as a record of services personally performed by Gardiner Sleeper, MD, PhD. It was created on their behalf by Roselee Nova, COMT. The creation of this record is the provider's dictation and/or activities during the visit.  Electronically signed by: Roselee Nova, COMT 05/31/21 9:58 AM  This document serves as a record of services personally performed by Gardiner Sleeper, MD, PhD. It was created on their behalf by San Jetty. Owens Shark, OA an ophthalmic technician. The creation of this record is the provider's dictation and/or activities during the visit.  Electronically signed by: San Jetty. Owens Shark, [place] 02.27.2023 9:58 AM   Gardiner Sleeper, M.D., Ph.D. Diseases & Surgery of the Retina and Vitreous [facility]  I have reviewed the above documentation for accuracy and completeness, and I agree with the above. Gardiner Sleeper, M.D., Ph.D. 05/31/21 10:00 AM   Abbreviations: M myopia (nearsighted); A astigmatism; H hyperopia (farsighted); P presbyopia; Mrx spectacle prescription;  CTL contact lenses; OD right eye; OS left  eye; OU both eyes  XT exotropia; ET esotropia; PEK punctate epithelial keratitis; PEE punctate epithelial erosions; DES dry eye syndrome; MGD meibomian gland dysfunction; ATs artificial tears; PFAT's preservative free artificial tears; [place] nuclear sclerotic cataract; PSC posterior subcapsular cataract; ERM epi-retinal membrane; PVD posterior vitreous detachment; RD retinal detachment; DM diabetes mellitus; DR diabetic retinopathy; NPDR non-proliferative diabetic retinopathy; PDR proliferative diabetic retinopathy; CSME clinically significant macular edema; DME diabetic macular edema; dbh dot blot hemorrhages; CWS cotton wool spot; POAG primary open angle glaucoma; C/D cup-to-disc ratio; HVF humphrey visual field; GVF goldmann visual field; OCT optical coherence tomography; IOP intraocular pressure; BRVO Branch retinal vein occlusion; CRVO central retinal vein occlusion; CRAO central retinal artery occlusion; BRAO branch retinal artery occlusion; RT retinal tear; SB scleral buckle; PPV pars plana vitrectomy; VH Vitreous hemorrhage; PRP panretinal laser photocoagulation; IVK intravitreal kenalog; VMT vitreomacular traction; MH Macular hole;  NVD neovascularization of the disc; NVE neovascularization elsewhere; AREDS age related eye disease study; ARMD age related macular degeneration; POAG primary open angle glaucoma; EBMD epithelial/anterior basement membrane dystrophy; ACIOL anterior chamber intraocular lens; IOL intraocular lens; PCIOL posterior chamber intraocular lens; Phaco/IOL phacoemulsification with intraocular lens placement; [place] photorefractive keratectomy; LASIK laser assisted in situ keratomileusis; HTN hypertension; DM diabetes mellitus; COPD chronic obstructive pulmonary disease

## 2021-05-31 ENCOUNTER — Encounter (INDEPENDENT_AMBULATORY_CARE_PROVIDER_SITE_OTHER): Payer: Self-pay | Admitting: Ophthalmology

## 2021-05-31 ENCOUNTER — Ambulatory Visit (INDEPENDENT_AMBULATORY_CARE_PROVIDER_SITE_OTHER): Payer: No Typology Code available for payment source | Admitting: Ophthalmology

## 2021-05-31 ENCOUNTER — Other Ambulatory Visit: Payer: Self-pay

## 2021-05-31 DIAGNOSIS — H35371 Puckering of macula, right eye: Secondary | ICD-10-CM | POA: Diagnosis not present

## 2021-05-31 DIAGNOSIS — Z961 Presence of intraocular lens: Secondary | ICD-10-CM

## 2021-05-31 DIAGNOSIS — I10 Essential (primary) hypertension: Secondary | ICD-10-CM

## 2021-05-31 DIAGNOSIS — E113412 Type 2 diabetes mellitus with severe nonproliferative diabetic retinopathy with macular edema, left eye: Secondary | ICD-10-CM

## 2021-05-31 DIAGNOSIS — E113511 Type 2 diabetes mellitus with proliferative diabetic retinopathy with macular edema, right eye: Secondary | ICD-10-CM | POA: Diagnosis not present

## 2021-05-31 DIAGNOSIS — H35033 Hypertensive retinopathy, bilateral: Secondary | ICD-10-CM

## 2021-05-31 DIAGNOSIS — H4311 Vitreous hemorrhage, right eye: Secondary | ICD-10-CM | POA: Diagnosis not present

## 2021-06-28 NOTE — Progress Notes (Shared)
?[facility] Note ? ?07/01/2021 ?  ? ?CHIEF COMPLAINT ?Patient presents for No chief complaint on file. ? ? ?HISTORY OF PRESENT ILLNESS: ?Matthew Sellers is a 81 y.o. male who presents to the clinic today for:  ? ? ? ?Pt states ? ?Referring physician: ?No referring provider defined for this encounter. ? ?HISTORICAL INFORMATION:  ? ?Selected notes from the [facility] ?[facility] ?LEE:  ?Ocular Hx- ?PMH- ?  ? ?CURRENT MEDICATIONS: ?Current Outpatient Medications (Ophthalmic Drugs)  ?Medication Sig  ? Brinzolamide-Brimonidine 1-0.2 % SUSP Place 1 drop into both eyes 2 (two) times daily.  ? carboxymethylcellulose (REFRESH PLUS) 0.5 % SOLN INSTILL 1 DROP IN BOTH EYES FIVE TIMES A DAY  ? latanoprost (XALATAN) 0.005 % ophthalmic solution Place 1 drop into both eyes at bedtime.  ? Latanoprost 0.005 % EMUL   ? timolol (TIMOPTIC-XR) 0.5 % ophthalmic gel-forming SMARTSIG:In Eye(s)  ? ?No current facility-administered medications for this visit. (Ophthalmic Drugs)  ? ?Current Outpatient Medications (Other)  ?Medication Sig  ? amLODipine (NORVASC) 10 MG tablet Take 10 mg by mouth daily.  ? aspirin EC 81 MG tablet Take 81 mg by mouth daily.  ? cefpodoxime (VANTIN) 200 MG tablet   ? fluticasone (FLONASE) 50 MCG/ACT nasal spray Place 2 sprays into both nostrils daily as needed for allergies or rhinitis.  ? gabapentin (NEURONTIN) 100 MG capsule Take 1 capsule by mouth 3 (three) times daily.  ? gabapentin (NEURONTIN) 100 MG capsule Take 2 capsules by mouth at bedtime.  ? HYDROcodone-acetaminophen (NORCO/VICODIN) 5-325 MG tablet Take 1-2 tablets by mouth every 4 (four) hours as needed for moderate pain.  ? insulin glargine (LANTUS) 100 UNIT/ML injection Inject 30 Units into the skin at bedtime.   ? insulin glargine-yfgn (SEMGLEE) 100 UNIT/ML Pen INJECT 36 UNITS SUBCUTANEOUSLY DAILY FOR DIABETES (CONVERTED FROM LANTUS)  ? JARDIANCE 25 MG TABS tablet Take 1 tablet by mouth daily.   ? Krill Oil 300 MG CAPS Take 300 mg by mouth daily.  ? lidocaine (LIDODERM) 5 % Place 1 patch onto the skin daily. Remove & Discard patch within 12 hours or as directed by MD  ? lisinopril (ZESTRIL) 20 MG tablet Take 2 tablets by mouth daily. (Patient not taking: Reported on 04/19/2021)  ? lisinopril-hydrochlorothiazide (ZESTORETIC) 20-25 MG tablet Take 2 tablets by mouth every morning.  ? metFORMIN (GLUCOPHAGE) 1000 MG tablet Take 1,000 mg by mouth 2 (two) times daily with a meal.  ? nitroGLYCERIN (NITROSTAT) 0.4 MG SL tablet Place 0.4 mg under the tongue every 5 (five) minutes as needed for chest pain.  ? pravastatin (PRAVACHOL) 20 MG tablet Take 20 mg by mouth at bedtime.  ? PRECISION XTRA TEST STRIPS test strip   ? ?No current facility-administered medications for this visit. (Other)  ? ?REVIEW OF SYSTEMS: ? ? ? ?ALLERGIES ?No Known Allergies ? ?PAST MEDICAL HISTORY ?Past Medical History:  ?Diagnosis Date  ? Anemia   ? Bradycardia   ? CAD (coronary artery disease)   ? Chest tightness   ? Chronic kidney disease   ? Diabetes mellitus without complication ([place])   ? Diabetic retinopathy ([place])   ? Dyslipidemia   ? Dyspnea on exertion   ? Glaucoma   ? Hypertension   ? Obesity   ? Paroxysmal atrial fibrillation (HCC)   ? a. identified on device interrogation  ? Pneumonia   ? ?Past Surgical History:  ?Procedure Laterality Date  ? ACHILLES TENDON  SURGERY    ? for rupture  ? CARDIAC CATHETERIZATION  07/08/2009  ? mild to mod . prox LAD 50% (Dr. Felton Clinton)  ? CATARACT EXTRACTION    ? CHOLECYSTECTOMY N/A 10/11/2017  ? Procedure: LAPAROSCOPIC CHOLECYSTECTOMY WITH INTRAOPERATIVE CHOLANGIOGRAM;  Surgeon: Alphonsa Overall, MD;  Location: [facility];  Service: General;  Laterality: N/A;  ? DOPPLER ECHOCARDIOGRAPHY  04/10/2008  ? LVEF 70-80% ,norm  ? EYE SURGERY    ? PACEMAKER IMPLANT N/A 10/12/2017  ? Procedure: PACEMAKER IMPLANT;  Surgeon: Evans Lance, MD;  Location: [place] [facility];  Service: Cardiovascular;  Laterality: N/A;  ?  stress myoview  04/09/2008  ? EF 54%; LV  norm  ? TEMPORARY PACEMAKER N/A 10/11/2017  ? Procedure: TEMPORARY PACEMAKER;  Surgeon: Evans Lance, MD;  Location: [place];  Service: Cardiovascular;  Laterality: N/A;  ? ?FAMILY HISTORY ?Family History  ?Problem Relation Age of Onset  ? Leukemia Mother   ? ?SOCIAL HISTORY ?Social History  ? ?Tobacco Use  ? Smoking status: Never  ? Smokeless tobacco: Never  ?Vaping Use  ? Vaping Use: Never used  ?Substance Use Topics  ? Alcohol use: No  ?  Alcohol/week: 0.0 standard drinks  ? Drug use: No  ?  ? ?  ?OPHTHALMIC EXAM: ? ?Not recorded ?  ? ?IMAGING AND PROCEDURES  ?Imaging and Procedures for 07/01/2021 ? ? ? ?  ?  ? ?  ?ASSESSMENT/PLAN: ? ?  ICD-10-CM   ?1. Proliferative diabetic retinopathy of right eye with macular edema associated with type 2 diabetes mellitus ([place])  S97.0263   ?  ?2. Vitreous hemorrhage, right eye (HCC)  H43.11   ?  ?3. Severe nonproliferative diabetic retinopathy of left eye with macular edema associated with type 2 diabetes mellitus ([place])  Z85.8850   ?  ?4. Epiretinal membrane (ERM) of right eye  H35.371   ?  ?5. Essential hypertension  I10   ?  ?6. Hypertensive retinopathy of both eyes  H35.033   ?  ?7. Pseudophakia, both eyes  Z96.1   ?  ? ? ?1,2. Proliferative diabetic retinopathy w/ vitreous hemorrhage, OD  ? - s/p IVA OD #1 (12.19.22), #2 (01.16.23), #3 (02.16.23) ? - s/p PRP OD (02.27.23) ? - referred by [facility] -- formerly followed by Dr. Anderson Malta ?- BCVA 20/20 - improved ?- exam shows +NV along inferior arcades and nasal midzone -- improving ?- FA (12.19.22) shows +NV along inferior arcades, focal NV nasal midzone, extensive vascular non-perfusion inferior periphery ?- OCT today shows OD: Mild interval improvement in vitreous opacities, Persistent cystic changes greatest temporal macula; OS: Mild improvement in cystic changes temporal macula ?- discussed findings, prognosis and treatment options ?- recommend IVA OD #4 today, 03.30.23 ?- pt  wishes to proceed with injection ?- RBA of procedure discussed, questions answered ?- IVA informed consent obtained and signed, 12.19.22 ?- see procedure note ?- VH precautions reviewed -- minimize activities, keep head elevated, avoid ASA/NSAIDs/blood thinners as able  ?- f/u 4 weeks possible injection ? ?3. Severe nonproliferative diabetic retinopathy w/ DME, left eye ? - BCVA 20/20 OS - improved ?- exam shows scattered MA, DBH, no NVOS  ?- FA (12.19.22) shows no NV, vascular non-perfusion temporal periphery with perivascular leakage ?- OCT with +cystic changes temporal mac, left eye  ?- monitor for now ? ?4. Epiretinal membrane, right eye  ?- mild ERM OD ?- BCVA 20/25 ?- asymptomatic, no metamorphopsia ?- no indication for surgery at this time ?- monitor for  now ? ?5,6. Hypertensive retinopathy OU ?- discussed importance of tight BP control ?- monitor ? ?7. Pseudophakia OU ? - s/p CE/IOL OU ? - IOLs in good position, doing well ? - monitor ? ? ?Ophthalmic Meds Ordered this visit:  ?No orders of the defined types were placed in this encounter. ?  ? ?No follow-ups on file. ? ?There are no Patient Instructions on file for this visit. ? ? ?Explained the diagnoses, plan, and follow up with the patient and they expressed understanding.  Patient expressed understanding of the importance of proper follow up care.  ? ?This document serves as a record of services personally performed by Gardiner Sleeper, MD, PhD. It was created on their behalf by San Jetty. Owens Shark, OA an ophthalmic technician. The creation of this record is the provider's dictation and/or activities during the visit.   ? ?Electronically signed by: San Jetty. Owens Shark, [place] 03.27.2023 1:01 PM ? ? ? ?Gardiner Sleeper, M.D., Ph.D. ?Diseases & Surgery of the Retina and Vitreous ?[facility] ? ? ? ? ?Abbreviations: ?M myopia (nearsighted); A astigmatism; H hyperopia (farsighted); P presbyopia; Mrx spectacle prescription;  CTL contact lenses; OD  right eye; OS left eye; OU both eyes  XT exotropia; ET esotropia; PEK punctate epithelial keratitis; PEE punctate epithelial erosions; DES dry eye syndrome; MGD meibomian gland dysfunction; ATs artifici

## 2021-07-01 ENCOUNTER — Encounter (INDEPENDENT_AMBULATORY_CARE_PROVIDER_SITE_OTHER): Payer: No Typology Code available for payment source | Admitting: Ophthalmology

## 2021-07-01 DIAGNOSIS — E113511 Type 2 diabetes mellitus with proliferative diabetic retinopathy with macular edema, right eye: Secondary | ICD-10-CM

## 2021-07-01 DIAGNOSIS — Z961 Presence of intraocular lens: Secondary | ICD-10-CM

## 2021-07-01 DIAGNOSIS — H4311 Vitreous hemorrhage, right eye: Secondary | ICD-10-CM

## 2021-07-01 DIAGNOSIS — E113412 Type 2 diabetes mellitus with severe nonproliferative diabetic retinopathy with macular edema, left eye: Secondary | ICD-10-CM

## 2021-07-01 DIAGNOSIS — H35371 Puckering of macula, right eye: Secondary | ICD-10-CM

## 2021-07-01 DIAGNOSIS — I10 Essential (primary) hypertension: Secondary | ICD-10-CM

## 2021-07-01 DIAGNOSIS — H35033 Hypertensive retinopathy, bilateral: Secondary | ICD-10-CM

## 2021-07-05 NOTE — Progress Notes (Signed)
?[facility] Note ? ?07/06/2021 ?  ? ?CHIEF COMPLAINT ?Patient presents for Retina Follow Up ? ? ?HISTORY OF PRESENT ILLNESS: ?Matthew Sellers is a 81 y.o. male who presents to the clinic today for:  ? ?HPI   ? ? Retina Follow Up   ?Patient presents with  Diabetic Retinopathy.  In right eye.  This started 5 weeks ago.  I, the attending physician,  performed the HPI with the patient and updated documentation appropriately. ? ?  ?  ? ? Comments   ?Patient here for 6 weeks retina follow up for PDR OD. Patient states vision no changes. Still has difficulty reading fine print on computer. Nothing new. No eye pain. Blood sugar being regulated.  ? ?  ?  ?Last edited by Bernarda Caffey, MD on 07/08/2021 12:44 AM.  ?  ? ?Pt states vision is stable ? ?Referring physician: ?No referring provider defined for this encounter. ? ?HISTORICAL INFORMATION:  ? ?Selected notes from the [facility] ?[facility] ?LEE:  ?Ocular Hx- ?PMH  ? ?CURRENT MEDICATIONS: ?Current Outpatient Medications (Ophthalmic Drugs)  ?Medication Sig  ? Brinzolamide-Brimonidine 1-0.2 % SUSP Place 1 drop into both eyes 2 (two) times daily.  ? carboxymethylcellulose (REFRESH PLUS) 0.5 % SOLN INSTILL 1 DROP IN BOTH EYES FIVE TIMES A DAY  ? latanoprost (XALATAN) 0.005 % ophthalmic solution Place 1 drop into both eyes at bedtime.  ? Latanoprost 0.005 % EMUL   ? timolol (TIMOPTIC-XR) 0.5 % ophthalmic gel-forming SMARTSIG:In Eye(s)  ? ?No current facility-administered medications for this visit. (Ophthalmic Drugs)  ? ?Current Outpatient Medications (Other)  ?Medication Sig  ? amLODipine (NORVASC) 10 MG tablet Take 10 mg by mouth daily.  ? aspirin EC 81 MG tablet Take 81 mg by mouth daily.  ? cefpodoxime (VANTIN) 200 MG tablet   ? gabapentin (NEURONTIN) 100 MG capsule Take 2 capsules by mouth at bedtime.  ? insulin glargine (LANTUS) 100 UNIT/ML injection Inject 30 Units into the skin at bedtime.   ? insulin  glargine-yfgn (SEMGLEE) 100 UNIT/ML Pen INJECT 36 UNITS SUBCUTANEOUSLY DAILY FOR DIABETES (CONVERTED FROM LANTUS)  ? JARDIANCE 25 MG TABS tablet Take 1 tablet by mouth daily.  ? Krill Oil 300 MG CAPS Take 300 mg by mouth daily.  ? lidocaine (LIDODERM) 5 % Place 1 patch onto the skin daily. Remove & Discard patch within 12 hours or as directed by MD  ? lisinopril-hydrochlorothiazide (ZESTORETIC) 20-25 MG tablet Take 2 tablets by mouth every morning.  ? metFORMIN (GLUCOPHAGE) 1000 MG tablet Take 1,000 mg by mouth 2 (two) times daily with a meal.  ? pravastatin (PRAVACHOL) 20 MG tablet Take 20 mg by mouth at bedtime.  ? PRECISION XTRA TEST STRIPS test strip   ? fluticasone (FLONASE) 50 MCG/ACT nasal spray Place 2 sprays into both nostrils daily as needed for allergies or rhinitis.  ? gabapentin (NEURONTIN) 100 MG capsule Take 1 capsule by mouth 3 (three) times daily.  ? HYDROcodone-acetaminophen (NORCO/VICODIN) 5-325 MG tablet Take 1-2 tablets by mouth every 4 (four) hours as needed for moderate pain.  ? lisinopril (ZESTRIL) 20 MG tablet Take 2 tablets by mouth daily. (Patient not taking: Reported on 04/19/2021)  ? nitroGLYCERIN (NITROSTAT) 0.4 MG SL tablet Place 0.4 mg under the tongue every 5 (five) minutes as needed for chest pain.  ? ?No current facility-administered medications for this visit. (Other)  ? ?REVIEW OF SYSTEMS: ?ROS   ?Positive for: Genitourinary, Endocrine,  Cardiovascular, Eyes ?Negative for: Constitutional, Gastrointestinal, Neurological, Skin, Musculoskeletal, HENT, Respiratory, Psychiatric, Allergic/Imm, Heme/Lymph ?Last edited by Theodore Demark, COA on 07/06/2021  2:13 PM.  ?  ? ?ALLERGIES ?No Known Allergies ? ?PAST MEDICAL HISTORY ?Past Medical History:  ?Diagnosis Date  ? Anemia   ? Bradycardia   ? CAD (coronary artery disease)   ? Chest tightness   ? Chronic kidney disease   ? Diabetes mellitus without complication ([address])   ? Diabetic retinopathy ([place])   ? Dyslipidemia   ? Dyspnea on exertion    ? Glaucoma   ? Hypertension   ? Obesity   ? Paroxysmal atrial fibrillation (HCC)   ? a. identified on device interrogation  ? Pneumonia   ? ?Past Surgical History:  ?Procedure Laterality Date  ? ACHILLES TENDON SURGERY    ? for rupture  ? CARDIAC CATHETERIZATION  07/08/2009  ? mild to mod . prox LAD 50% (Dr. Felton Clinton)  ? CATARACT EXTRACTION    ? CHOLECYSTECTOMY N/A 10/11/2017  ? Procedure: LAPAROSCOPIC CHOLECYSTECTOMY WITH INTRAOPERATIVE CHOLANGIOGRAM;  Surgeon: Alphonsa Overall, MD;  Location: [place] [facility];  Service: General;  Laterality: N/A;  ? DOPPLER ECHOCARDIOGRAPHY  04/10/2008  ? LVEF 70-80% ,norm  ? EYE SURGERY    ? PACEMAKER IMPLANT N/A 10/12/2017  ? Procedure: PACEMAKER IMPLANT;  Surgeon: Evans Lance, MD;  Location: [facility];  Service: Cardiovascular;  Laterality: N/A;  ? stress myoview  04/09/2008  ? EF 54%; LV  norm  ? TEMPORARY PACEMAKER N/A 10/11/2017  ? Procedure: TEMPORARY PACEMAKER;  Surgeon: Evans Lance, MD;  Location: [facility];  Service: Cardiovascular;  Laterality: N/A;  ? ?FAMILY HISTORY ?Family History  ?Problem Relation Age of Onset  ? Leukemia Mother   ? ?SOCIAL HISTORY ?Social History  ? ?Tobacco Use  ? Smoking status: Never  ? Smokeless tobacco: Never  ?Vaping Use  ? Vaping Use: Never used  ?Substance Use Topics  ? Alcohol use: No  ?  Alcohol/week: 0.0 standard drinks  ? Drug use: No  ?  ? ?  ?OPHTHALMIC EXAM: ? ?Base Eye Exam   ? ? Visual Acuity (Snellen - Linear)   ? ?   Right Left  ? Dist cc 20/25 20/20 -2  ? Dist ph cc NI   ? ?  ?  ? ? Tonometry (Tonopen, 2:10 PM)   ? ?   Right Left  ? Pressure 22 13  ? ?  ?  ? ? Pupils   ? ?   Dark Light Shape React APD  ? Right 2 1 Round Brisk None  ? Left 2 1 Round Brisk None  ? ?  ?  ? ? Visual Fields (Counting fingers)   ? ?   Left Right  ?  Full Full  ? ?  ?  ? ? Extraocular Movement   ? ?   Right Left  ?  Full, Ortho Full, Ortho  ? ?  ?  ? ? Neuro/Psych   ? ? Oriented x3: Yes  ? Mood/Affect: Normal  ? ?  ?  ? ? Dilation   ? ?  Both eyes: 1.0% Mydriacyl, 2.5% Phenylephrine @ 2:10 PM  ? ?  ?  ? ?  ? ?Slit Lamp and Fundus Exam   ? ? Slit Lamp Exam   ? ?   Right Left  ? Lids/Lashes Dermatochalasis - upper lid, mild MGD Dermatochalasis - upper lid, mild MGD  ? Conjunctiva/Sclera nasal pingeucula, Melanosis nasal  pingeucula, Melanosis  ? Cornea 2-3+ Punctate epithelial erosions, well healed cataract wound 2-3+ Punctate epithelial erosions, well healed cataract wound  ? Anterior Chamber Deep and quiet Deep and quiet  ? Iris round and poorly dilated to 89mm, No NVI round and poorly dilated to 13mm, No NVI  ? Lens PC IOL in good position PC IOL in good position  ? Anterior Vitreous Mild Vitreous syneresis, Posterior vitreous detachment, blood stained vitreous condensations inferiorly -- clearing centrally and settling inferiorly Mild Vitreous syneresis, Posterior vitreous detachment, vitreous condensations  ? ?  ?  ? ? Fundus Exam   ? ?   Right Left  ? Disc mild Pallor, Sharp rim, +cupping mild Pallor, Sharp rim, +cupping, focal PPP temporal  ? C/D Ratio 0.6 0.6  ? Macula Flat, Blunted foveal reflex, scattered Microaneurysms, trace cystic changes - improved, , RPE mottling and clumping Flat, Blunted foveal reflex, scattered MA  ? Vessels attenuated, Tortuous attenuated, Tortuous, no obvious NV, Copper wiring  ? Periphery Attached, +NV along inferior arcades, +NV nasal midzone - improving, 360 MA/DBH, pre-retinal hemes settled inferiorly, good 360 PRP changes Attached, 360 MA/DBH, focal exudates nasal midzone  ? ?  ?  ? ?  ? ?Refraction   ? ? Wearing Rx   ? ?   Sphere Cylinder Axis Add  ? Right -0.75 +1.25 004 +2.75  ? Left -0.75 +1.25 004 +2.75  ? ? Type: PAL  ? ?  ?  ? ?  ? ?IMAGING AND PROCEDURES  ?Imaging and Procedures for 07/06/2021 ? ?OCT, Retina - OU - Both Eyes   ? ?   ?Right Eye ?Quality was good. Central Foveal Thickness: 290. Progression has improved. Findings include abnormal foveal contour, no SRF, epiretinal membrane, preretinal  fibrosis, no IRF (Mild interval improvement in vitreous opacities, pre-retinal fibrosis inferiorly, interval improvement in cystic changes temporal macula).  ? ?Left Eye ?Quality was good. Central Foveal Thickness: 265.

## 2021-07-06 ENCOUNTER — Encounter (INDEPENDENT_AMBULATORY_CARE_PROVIDER_SITE_OTHER): Payer: Self-pay | Admitting: Ophthalmology

## 2021-07-06 ENCOUNTER — Ambulatory Visit (INDEPENDENT_AMBULATORY_CARE_PROVIDER_SITE_OTHER): Payer: No Typology Code available for payment source | Admitting: Ophthalmology

## 2021-07-06 DIAGNOSIS — E113412 Type 2 diabetes mellitus with severe nonproliferative diabetic retinopathy with macular edema, left eye: Secondary | ICD-10-CM

## 2021-07-06 DIAGNOSIS — Z961 Presence of intraocular lens: Secondary | ICD-10-CM

## 2021-07-06 DIAGNOSIS — H4311 Vitreous hemorrhage, right eye: Secondary | ICD-10-CM

## 2021-07-06 DIAGNOSIS — H35371 Puckering of macula, right eye: Secondary | ICD-10-CM | POA: Diagnosis not present

## 2021-07-06 DIAGNOSIS — E113511 Type 2 diabetes mellitus with proliferative diabetic retinopathy with macular edema, right eye: Secondary | ICD-10-CM | POA: Diagnosis not present

## 2021-07-06 DIAGNOSIS — I10 Essential (primary) hypertension: Secondary | ICD-10-CM

## 2021-07-06 DIAGNOSIS — H35033 Hypertensive retinopathy, bilateral: Secondary | ICD-10-CM

## 2021-07-08 ENCOUNTER — Encounter (INDEPENDENT_AMBULATORY_CARE_PROVIDER_SITE_OTHER): Payer: Self-pay | Admitting: Ophthalmology

## 2021-07-08 MED ORDER — BEVACIZUMAB CHEMO INJECTION 1.25MG/0.05ML SYRINGE FOR KALEIDOSCOPE
1.2500 mg | INTRAVITREAL | Status: AC | PRN
  Administered 2021-07-08: 1.25 mg via INTRAVITREAL

## 2021-08-24 NOTE — Progress Notes (Addendum)
[facility] Note  08/31/2021    CHIEF COMPLAINT Patient presents for Retina Follow Up   HISTORY OF PRESENT ILLNESS: Matthew Sellers is a 81 y.o. male who presents to the clinic today for:   HPI     Retina Follow Up   Patient presents with  Diabetic Retinopathy.  In right eye.  This started 8 weeks ago.  I, the attending physician,  performed the HPI with the patient and updated documentation appropriately.        Comments   Patient here for 8 weeks retina follow up for PDR OD. Patient states vision as good as to be expected. No eye pain. OD felt a little discomfort this am. Used drops took that away.      Last edited by Bernarda Caffey, MD on 08/31/2021  3:52 PM.    Pt states vision is stable  Referring physician: No referring provider defined for this encounter.  HISTORICAL INFORMATION:   Selected notes from the MEDICAL RECORD NUMBER [facility]:  Ocular Hx- PMH   CURRENT MEDICATIONS: Current Outpatient Medications (Ophthalmic Drugs)  Medication Sig   Brinzolamide-Brimonidine 1-0.2 % SUSP Place 1 drop into both eyes 2 (two) times daily.   carboxymethylcellulose (REFRESH PLUS) 0.5 % SOLN INSTILL 1 DROP IN BOTH EYES FIVE TIMES A DAY   latanoprost (XALATAN) 0.005 % ophthalmic solution Place 1 drop into both eyes at bedtime.   Latanoprost 0.005 % EMUL    timolol (TIMOPTIC-XR) 0.5 % ophthalmic gel-forming SMARTSIG:In Eye(s)   brimonidine (ALPHAGAN) 0.2 % ophthalmic solution Place 1 drop into the right eye 2 (two) times daily.   No current facility-administered medications for this visit. (Ophthalmic Drugs)   Current Outpatient Medications (Other)  Medication Sig   amLODipine (NORVASC) 10 MG tablet Take 10 mg by mouth daily.   aspirin EC 81 MG tablet Take 81 mg by mouth daily.   cefpodoxime (VANTIN) 200 MG tablet    gabapentin (NEURONTIN) 100 MG capsule Take 2 capsules by mouth at bedtime.   insulin glargine (LANTUS)  100 UNIT/ML injection Inject 30 Units into the skin at bedtime.    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   Krill Oil 300 MG CAPS Take 300 mg by mouth daily.   lidocaine (LIDODERM) 5 % Place 1 patch onto the skin daily. Remove & Discard patch within 12 hours or as directed by MD   lisinopril-hydrochlorothiazide (ZESTORETIC) 20-25 MG tablet Take 2 tablets by mouth every morning.   metFORMIN (GLUCOPHAGE) 1000 MG tablet Take 1,000 mg by mouth 2 (two) times daily with a meal.   pravastatin (PRAVACHOL) 20 MG tablet Take 20 mg by mouth at bedtime.   PRECISION XTRA TEST STRIPS test strip    fluticasone (FLONASE) 50 MCG/ACT nasal spray Place 2 sprays into both nostrils daily as needed for allergies or rhinitis.   gabapentin (NEURONTIN) 100 MG capsule Take 1 capsule by mouth 3 (three) times daily.   HYDROcodone-acetaminophen (NORCO/VICODIN) 5-325 MG tablet Take 1-2 tablets by mouth every 4 (four) hours as needed for moderate pain.   lisinopril (ZESTRIL) 20 MG tablet Take 2 tablets by mouth daily. (Patient not taking: Reported on 04/19/2021)   nitroGLYCERIN (NITROSTAT) 0.4 MG SL tablet Place 0.4 mg under the tongue every 5 (five) minutes as needed for chest pain.  No current facility-administered medications for this visit. (Other)   REVIEW OF SYSTEMS: ROS   Positive for: Genitourinary, Endocrine, Cardiovascular, Eyes Negative for: Constitutional, Gastrointestinal, Neurological, Skin, Musculoskeletal, HENT, Respiratory, Psychiatric, Allergic/Imm, Heme/Lymph Last edited by Theodore Demark, COA on 08/31/2021  2:39 PM.      ALLERGIES No Known Allergies  PAST MEDICAL HISTORY Past Medical History:  Diagnosis Date   Anemia    Bradycardia    CAD (coronary artery disease)    Chest tightness    Chronic kidney disease    Diabetes mellitus without complication  (HCC)    Diabetic retinopathy ([place])    Dyslipidemia    Dyspnea on exertion    Glaucoma    Hypertension    Obesity    Paroxysmal atrial fibrillation ([place])    a. identified on device interrogation   Pneumonia    Past Surgical History:  Procedure Laterality Date   ACHILLES TENDON SURGERY     for rupture   CARDIAC CATHETERIZATION  07/08/2009   mild to mod . prox LAD 50% (Dr. Felton Clinton)   CATARACT EXTRACTION     CHOLECYSTECTOMY N/A 10/11/2017   Procedure: LAPAROSCOPIC CHOLECYSTECTOMY WITH INTRAOPERATIVE CHOLANGIOGRAM;  Surgeon: Alphonsa Overall, MD;  Location: [facility];  Service: General;  Laterality: N/A;   DOPPLER ECHOCARDIOGRAPHY  04/10/2008   LVEF 70-80% ,norm   EYE SURGERY     PACEMAKER IMPLANT N/A 10/12/2017   Procedure: PACEMAKER IMPLANT;  Surgeon: Evans Lance, MD;  Location: [facility];  Service: Cardiovascular;  Laterality: N/A;   stress myoview  04/09/2008   EF 54%; LV  norm   TEMPORARY PACEMAKER N/A 10/11/2017   Procedure: TEMPORARY PACEMAKER;  Surgeon: Evans Lance, MD;  Location: [facility];  Service: Cardiovascular;  Laterality: N/A;   FAMILY HISTORY Family History  Problem Relation Age of Onset   Leukemia Mother    SOCIAL HISTORY Social History   Tobacco Use   Smoking status: Never   Smokeless tobacco: Never  Vaping Use   Vaping Use: Never used  Substance Use Topics   Alcohol use: No    Alcohol/week: 0.0 standard drinks   Drug use: No       OPHTHALMIC EXAM:  Base Eye Exam     Visual Acuity (Snellen - Linear)       Right Left   Dist cc 20/25 -2 20/25 +1   Dist ph cc NI 20/20 -2    Correction: Glasses         Tonometry (Tonopen, 2:36 PM)       Right Left   Pressure 22 16         Pupils       Dark Light Shape React APD   Right 2 1 Round Brisk None   Left 2 1 Round Brisk None         Visual Fields (Counting fingers)       Left Right    Full Full         Extraocular Movement       Right Left    Full,  Ortho Full, Ortho         Neuro/Psych     Oriented x3: Yes   Mood/Affect: Normal         Dilation     Both eyes: 1.0% Mydriacyl, 2.5% Phenylephrine @ 2:36 PM           Slit Lamp and Fundus Exam     Slit Lamp Exam  Right Left   Lids/Lashes Dermatochalasis - upper lid, mild MGD Dermatochalasis - upper lid, mild MGD   Conjunctiva/Sclera nasal pingeucula, Melanosis nasal pingeucula, Melanosis   Cornea 2-3+ Punctate epithelial erosions, well healed cataract wound 2-3+ Punctate epithelial erosions, well healed cataract wound   Anterior Chamber Deep and quiet Deep and quiet   Iris round and poorly dilated to 71m, No NVI round and poorly dilated to 473m No NVI   Lens PC IOL in good position PC IOL in good position   Anterior Vitreous Mild Vitreous syneresis, Posterior vitreous detachment, blood stained vitreous condensations inferiorly -- clearing centrally and settling inferiorly Mild Vitreous syneresis, Posterior vitreous detachment, vitreous condensations         Fundus Exam       Right Left   Disc mild Pallor, Sharp rim, +cupping mild Pallor, Sharp rim, +cupping, focal PPP temporal   C/D Ratio 0.6 0.6   Macula Flat, Blunted foveal reflex, scattered Microaneurysms, trace cystic changes - stably improved, RPE mottling and clumping Flat, good foveal reflex, rare MA   Vessels attenuated, Tortuous attenuated, Tortuous, no obvious NV, Copper wiring   Periphery Attached, +NV along inferior arcades, +NV nasal midzone - improving, 360 MA/DBH, pre-retinal hemes settled inferiorly - improved, good 360 PRP changes -- room for superior fill in, scattered fibrosis Attached, 360 MA/DBH, focal exudates nasal midzone, no PRP laser           Refraction     Wearing Rx       Sphere Cylinder Axis Add   Right -0.75 +1.25 004 +2.75   Left -0.75 +1.25 004 +2.75    Type: PAL           IMAGING AND PROCEDURES  Imaging and Procedures for 08/31/2021  OCT, Retina - OU - Both  Eyes       Right Eye Quality was good. Central Foveal Thickness: 291. Progression has improved. Findings include abnormal foveal contour, no SRF, epiretinal membrane, preretinal fibrosis, no IRF (Mild interval improvement in vitreous opacities, pre-retinal fibrosis inferiorly, stable improvement in cystic changes temporal macula).   Left Eye Quality was good. Central Foveal Thickness: 263. Progression has been stable. Findings include normal foveal contour, intraretinal fluid, intraretinal hyper-reflective material, no SRF (persistent IRF/IRHM temporal macula).   Notes *Images captured and stored on drive  Diagnosis / Impression:  OD: Mild interval improvement in vitreous opacities, pre-retinal fibrosis inferiorly, interval improvement in cystic changes temporal macula OS: Mild improvement in IRF/IRHM temporal macula  Clinical management:  See below  Abbreviations: NFP - Normal foveal profile. CME - cystoid macular edema. PED - pigment epithelial detachment. IRF - intraretinal fluid. SRF - subretinal fluid. EZ - ellipsoid zone. ERM - epiretinal membrane. ORA - outer retinal atrophy. ORT - outer retinal tubulation. SRHM - subretinal hyper-reflective material. IRHM - intraretinal hyper-reflective material      Intravitreal Injection, Pharmacologic Agent - OD - Right Eye       Time Out 08/31/2021. 3:15 PM. Confirmed correct patient, procedure, site, and patient consented.   Anesthesia Topical anesthesia was used. Anesthetic medications included Lidocaine 2%, Proparacaine 0.5%.   Procedure Preparation included 5% betadine to ocular surface, eyelid speculum. A supplied needle was used.   Injection: 1.25 mg Bevacizumab 1.2537m.05ml   Route: Intravitreal, Site: Right Eye   NDC: 5: 62376-283-15ot: [id number] , Expiration date: 10/19/2021   Post-op Post injection exam found visual acuity of at least counting fingers. The patient tolerated the procedure well. There were no  complications. The patient received  written and verbal post procedure care education. Post injection medications were not given.            ASSESSMENT/PLAN:    ICD-10-CM   1. Proliferative diabetic retinopathy of right eye with macular edema associated with type 2 diabetes mellitus (HCC)  E11.3511 OCT, Retina - OU - Both Eyes    Intravitreal Injection, Pharmacologic Agent - OD - Right Eye    Bevacizumab (AVASTIN) SOLN 1.25 mg    2. Vitreous hemorrhage, right eye (HCC)  H43.11 Intravitreal Injection, Pharmacologic Agent - OD - Right Eye    Bevacizumab (AVASTIN) SOLN 1.25 mg    3. Severe nonproliferative diabetic retinopathy of left eye with macular edema associated with type 2 diabetes mellitus (HCC)  V37.1062 OCT, Retina - OU - Both Eyes    4. Epiretinal membrane (ERM) of right eye  H35.371     5. Essential hypertension  I10     6. Hypertensive retinopathy of both eyes  H35.033 OCT, Retina - OU - Both Eyes    7. Pseudophakia, both eyes  Z96.1     8. Ocular hypertension of right eye  H40.051       1,2. Proliferative diabetic retinopathy w/ vitreous hemorrhage, OD   - delayed f/u -- 8 wks instead of 4  - s/p IVA OD #1 (12.19.22), #2 (01.16.23), #3 (02.16.23), #4 (04.04.23)  - s/p PRP OD (02.27.23)  - referred by [facility] -- formerly followed by Dr. Anderson Malta - BCVA 20/25 - decreased from 20/20 - exam shows +NV along inferior arcades and nasal midzone -- improving - FA (12.19.22) shows +NV along inferior arcades, focal NV nasal midzone, extensive vascular non-perfusion inferior periphery - OCT today shows OD: Mild interval improvement in vitreous opacities, pre-retinal fibrosis inferiorly, interval improvement in cystic changes temporal macula; OS: Mild improvement in IRF/IRHM temporal macula at 8 weeks - discussed findings, prognosis and treatment options - recommend IVA OD #5 today, 05.30.23 for residual VH w/ f/u in 10 wks - pt wishes to proceed with injection - RBA of procedure  discussed, questions answered - IVA informed consent obtained and signed, 12.19.22 - see procedure note - VH precautions reviewed -- minimize activities, keep head elevated, avoid ASA/NSAIDs/blood thinners as able  - f/u 10 weeks, DFE, OCT, FA (transit OD)  3. Severe nonproliferative diabetic retinopathy w/ DME, left eye  - BCVA 20/20 OS - improved - exam shows scattered MA, DBH, no NVOS  - FA (12.19.22) shows no NV, vascular non-perfusion temporal periphery with perivascular leakage - OCT with +cystic changes temporal mac, left eye  - monitor  4. Epiretinal membrane, right eye  - mild ERM OD - BCVA 20/25 - asymptomatic, no metamorphopsia - no indication for surgery at this time - monitor  5,6. Hypertensive retinopathy OU - discussed importance of tight BP control - monitor  7. Pseudophakia OU  - s/p CE/IOL OU  - IOLs in good position, doing well  - monitor  8. Ocular Hypertension OD  - IOP OD is 22 today, 25 last visit  - will start brimonidine BID OD   Ophthalmic Meds Ordered this visit:  Meds ordered this encounter  Medications   DISCONTD: brimonidine (ALPHAGAN) 0.2 % ophthalmic solution    Sig: Place 1 drop into the right eye 2 (two) times daily.    Dispense:  10 mL    Refill:  6   brimonidine (ALPHAGAN) 0.2 % ophthalmic solution    Sig: Place 1 drop into the right eye 2 (two) times  daily.    Dispense:  10 mL    Refill:  6   Bevacizumab (AVASTIN) SOLN 1.25 mg     Return in about 10 weeks (around 11/09/2021) for f/u PDR OD, DFE, OCT.  There are no Patient Instructions on file for this visit.   Explained the diagnoses, plan, and follow up with the patient and they expressed understanding.  Patient expressed understanding of the importance of proper follow up care.  This document serves as a record of services personally performed by Gardiner Sleeper, MD, PhD. It was created on their behalf by Bernarda Caffey, MD, an ophthalmic technician. The creation of this record  is the provider's dictation and/or activities during the visit.    Electronically signed by: Bernarda Caffey, MD 08/24/21 3:57 PM   Gardiner Sleeper, M.D., Ph.D. Diseases & Surgery of the Retina and Vitreous [facility]  I have reviewed the above documentation for accuracy and completeness, and I agree with the above. Gardiner Sleeper, M.D., Ph.D. 08/31/21 3:57 PM   Abbreviations: M myopia (nearsighted); A astigmatism; H hyperopia (farsighted); P presbyopia; Mrx spectacle prescription;  CTL contact lenses; OD right eye; OS left eye; OU both eyes  XT exotropia; ET esotropia; PEK punctate epithelial keratitis; PEE punctate epithelial erosions; DES dry eye syndrome; MGD meibomian gland dysfunction; ATs artificial tears; PFAT's preservative free artificial tears; [place] nuclear sclerotic cataract; PSC posterior subcapsular cataract; ERM epi-retinal membrane; PVD posterior vitreous detachment; RD retinal detachment; DM diabetes mellitus; DR diabetic retinopathy; NPDR non-proliferative diabetic retinopathy; PDR proliferative diabetic retinopathy; CSME clinically significant macular edema; DME diabetic macular edema; dbh dot blot hemorrhages; CWS cotton wool spot; POAG primary open angle glaucoma; C/D cup-to-disc ratio; HVF humphrey visual field; GVF goldmann visual field; OCT optical coherence tomography; IOP intraocular pressure; BRVO Branch retinal vein occlusion; CRVO central retinal vein occlusion; CRAO central retinal artery occlusion; BRAO branch retinal artery occlusion; RT retinal tear; SB scleral buckle; PPV pars plana vitrectomy; VH Vitreous hemorrhage; PRP panretinal laser photocoagulation; IVK intravitreal kenalog; VMT vitreomacular traction; MH Macular hole;  NVD neovascularization of the disc; NVE neovascularization elsewhere; AREDS age related eye disease study; ARMD age related macular degeneration; POAG primary open angle glaucoma; EBMD epithelial/anterior basement membrane  dystrophy; ACIOL anterior chamber intraocular lens; IOL intraocular lens; PCIOL posterior chamber intraocular lens; Phaco/IOL phacoemulsification with intraocular lens placement; [place] photorefractive keratectomy; LASIK laser assisted in situ keratomileusis; HTN hypertension; DM diabetes mellitus; COPD chronic obstructive pulmonary disease

## 2021-08-31 ENCOUNTER — Encounter (INDEPENDENT_AMBULATORY_CARE_PROVIDER_SITE_OTHER): Payer: Self-pay | Admitting: Ophthalmology

## 2021-08-31 ENCOUNTER — Ambulatory Visit (INDEPENDENT_AMBULATORY_CARE_PROVIDER_SITE_OTHER): Payer: No Typology Code available for payment source | Admitting: Ophthalmology

## 2021-08-31 DIAGNOSIS — H35371 Puckering of macula, right eye: Secondary | ICD-10-CM

## 2021-08-31 DIAGNOSIS — E113511 Type 2 diabetes mellitus with proliferative diabetic retinopathy with macular edema, right eye: Secondary | ICD-10-CM | POA: Diagnosis not present

## 2021-08-31 DIAGNOSIS — H4311 Vitreous hemorrhage, right eye: Secondary | ICD-10-CM | POA: Diagnosis not present

## 2021-08-31 DIAGNOSIS — E113412 Type 2 diabetes mellitus with severe nonproliferative diabetic retinopathy with macular edema, left eye: Secondary | ICD-10-CM | POA: Diagnosis not present

## 2021-08-31 DIAGNOSIS — Z961 Presence of intraocular lens: Secondary | ICD-10-CM

## 2021-08-31 DIAGNOSIS — H35033 Hypertensive retinopathy, bilateral: Secondary | ICD-10-CM

## 2021-08-31 DIAGNOSIS — I10 Essential (primary) hypertension: Secondary | ICD-10-CM

## 2021-08-31 DIAGNOSIS — H40051 Ocular hypertension, right eye: Secondary | ICD-10-CM

## 2021-08-31 MED ORDER — BEVACIZUMAB CHEMO INJECTION 1.25MG/0.05ML SYRINGE FOR KALEIDOSCOPE
1.2500 mg | INTRAVITREAL | Status: AC | PRN
  Administered 2021-08-31: 1.25 mg via INTRAVITREAL

## 2021-08-31 MED ORDER — BRIMONIDINE TARTRATE 0.2 % OP SOLN: 1.0000 [drp] | Freq: Two times a day (BID) | OPHTHALMIC | 6 refills | Status: DC

## 2021-09-03 ENCOUNTER — Other Ambulatory Visit (INDEPENDENT_AMBULATORY_CARE_PROVIDER_SITE_OTHER): Payer: Self-pay

## 2021-09-03 MED ORDER — BRIMONIDINE TARTRATE 0.2 % OP SOLN: 1.0000 [drp] | Freq: Two times a day (BID) | OPHTHALMIC | 6 refills | Status: DC

## 2021-11-09 ENCOUNTER — Encounter (INDEPENDENT_AMBULATORY_CARE_PROVIDER_SITE_OTHER): Payer: No Typology Code available for payment source | Admitting: Ophthalmology

## 2021-11-09 DIAGNOSIS — H40051 Ocular hypertension, right eye: Secondary | ICD-10-CM

## 2021-11-09 DIAGNOSIS — H35371 Puckering of macula, right eye: Secondary | ICD-10-CM

## 2021-11-09 DIAGNOSIS — I10 Essential (primary) hypertension: Secondary | ICD-10-CM

## 2021-11-09 DIAGNOSIS — H4311 Vitreous hemorrhage, right eye: Secondary | ICD-10-CM

## 2021-11-09 DIAGNOSIS — E113412 Type 2 diabetes mellitus with severe nonproliferative diabetic retinopathy with macular edema, left eye: Secondary | ICD-10-CM

## 2021-11-09 DIAGNOSIS — H35033 Hypertensive retinopathy, bilateral: Secondary | ICD-10-CM

## 2021-11-09 DIAGNOSIS — Z961 Presence of intraocular lens: Secondary | ICD-10-CM

## 2021-11-09 DIAGNOSIS — E113511 Type 2 diabetes mellitus with proliferative diabetic retinopathy with macular edema, right eye: Secondary | ICD-10-CM

## 2021-11-25 NOTE — Progress Notes (Signed)
[facility] Note  12/07/2021    CHIEF COMPLAINT Patient presents for Retina Follow Up   HISTORY OF PRESENT ILLNESS: Matthew Sellers is a 81 y.o. male who presents to the clinic today for:   HPI     Retina Follow Up   Patient presents with  Diabetic Retinopathy.  In right eye.  This started 10 weeks ago.  I, the attending physician,  performed the HPI with the patient and updated documentation appropriately.        Comments   Patient here for 10 weeks retina follow up for PDR OD. Patient states vision about the same. No eye pain.       Last edited by Bernarda Caffey, MD on 12/07/2021  4:28 PM.     Pt states vision is stable  Referring physician: No referring provider defined for this encounter.  HISTORICAL INFORMATION:   Selected notes from the MEDICAL RECORD NUMBER [facility] LEE:  Ocular Hx- PMH   CURRENT MEDICATIONS: Current Outpatient Medications (Ophthalmic Drugs)  Medication Sig   brimonidine (ALPHAGAN) 0.2 % ophthalmic solution Place 1 drop into the right eye 2 (two) times daily.   Brinzolamide-Brimonidine 1-0.2 % SUSP Place 1 drop into both eyes 2 (two) times daily.   carboxymethylcellulose (REFRESH PLUS) 0.5 % SOLN INSTILL 1 DROP IN BOTH EYES FIVE TIMES A DAY   latanoprost (XALATAN) 0.005 % ophthalmic solution Place 1 drop into both eyes at bedtime.   Latanoprost 0.005 % EMUL    timolol (TIMOPTIC-XR) 0.5 % ophthalmic gel-forming SMARTSIG:In Eye(s)   No current facility-administered medications for this visit. (Ophthalmic Drugs)   Current Outpatient Medications (Other)  Medication Sig   amLODipine (NORVASC) 10 MG tablet Take 10 mg by mouth daily.   aspirin EC 81 MG tablet Take 81 mg by mouth daily.   cefpodoxime (VANTIN) 200 MG tablet    gabapentin (NEURONTIN) 100 MG capsule Take 2 capsules by mouth at bedtime.   insulin glargine (LANTUS) 100 UNIT/ML injection Inject 30 Units into the skin at bedtime.     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   Krill Oil 300 MG CAPS Take 300 mg by mouth daily.   lidocaine (LIDODERM) 5 % Place 1 patch onto the skin daily. Remove & Discard patch within 12 hours or as directed by MD   lisinopril-hydrochlorothiazide (ZESTORETIC) 20-25 MG tablet Take 2 tablets by mouth every morning.   metFORMIN (GLUCOPHAGE) 1000 MG tablet Take 1,000 mg by mouth 2 (two) times daily with a meal.   pravastatin (PRAVACHOL) 20 MG tablet Take 20 mg by mouth at bedtime.   PRECISION XTRA TEST STRIPS test strip    fluticasone (FLONASE) 50 MCG/ACT nasal spray Place 2 sprays into both nostrils daily as needed for allergies or rhinitis.   gabapentin (NEURONTIN) 100 MG capsule Take 1 capsule by mouth 3 (three) times daily.   HYDROcodone-acetaminophen (NORCO/VICODIN) 5-325 MG tablet Take 1-2 tablets by mouth every 4 (four) hours as needed for moderate pain.   lisinopril (ZESTRIL) 20 MG tablet Take 2 tablets by mouth daily. (Patient not taking: Reported on 04/19/2021)   nitroGLYCERIN (NITROSTAT) 0.4 MG SL tablet Place 0.4 mg under the tongue every 5 (five) minutes as needed for chest pain.   No current facility-administered medications for this visit. (Other)   REVIEW OF SYSTEMS:  ROS   Positive for: Genitourinary, Endocrine, Cardiovascular, Eyes Negative for: Constitutional, Gastrointestinal, Neurological, Skin, Musculoskeletal, HENT, Respiratory, Psychiatric, Allergic/Imm, Heme/Lymph Last edited by Theodore Demark, COA on 12/07/2021  2:35 PM.     ALLERGIES No Known Allergies  PAST MEDICAL HISTORY Past Medical History:  Diagnosis Date   Anemia    Bradycardia    CAD (coronary artery disease)    Chest tightness    Chronic kidney disease    Diabetes mellitus without complication (HCC)    Diabetic retinopathy ([place])    Dyslipidemia    Dyspnea on  exertion    Glaucoma    Hypertension    Obesity    Paroxysmal atrial fibrillation ([place])    a. identified on device interrogation   Pneumonia    Past Surgical History:  Procedure Laterality Date   ACHILLES TENDON SURGERY     for rupture   CARDIAC CATHETERIZATION  07/08/2009   mild to mod . prox LAD 50% (Dr. Felton Clinton)   CATARACT EXTRACTION     CHOLECYSTECTOMY N/A 10/11/2017   Procedure: LAPAROSCOPIC CHOLECYSTECTOMY WITH INTRAOPERATIVE CHOLANGIOGRAM;  Surgeon: Alphonsa Overall, MD;  Location: [place];  Service: General;  Laterality: N/A;   DOPPLER ECHOCARDIOGRAPHY  04/10/2008   LVEF 70-80% ,norm   EYE SURGERY     PACEMAKER IMPLANT N/A 10/12/2017   Procedure: PACEMAKER IMPLANT;  Surgeon: Evans Lance, MD;  Location: [place] [facility];  Service: Cardiovascular;  Laterality: N/A;   stress myoview  04/09/2008   EF 54%; LV  norm   TEMPORARY PACEMAKER N/A 10/11/2017   Procedure: TEMPORARY PACEMAKER;  Surgeon: Evans Lance, MD;  Location: [place] [facility];  Service: Cardiovascular;  Laterality: N/A;   FAMILY HISTORY Family History  Problem Relation Age of Onset   Leukemia Mother    SOCIAL HISTORY Social History   Tobacco Use   Smoking status: Never   Smokeless tobacco: Never  Vaping Use   Vaping Use: Never used  Substance Use Topics   Alcohol use: No    Alcohol/week: 0.0 standard drinks of alcohol   Drug use: No       OPHTHALMIC EXAM:  Base Eye Exam     Visual Acuity (Snellen - Linear)       Right Left   Dist cc 20/25 -1 20/25 -2   Dist ph cc NI 20/20 -2    Correction: Glasses         Tonometry (Tonopen, 2:33 PM)       Right Left   Pressure 20 17         Pupils       Dark Light Shape React APD   Right 2 1 Round Brisk None   Left 2 1 Round Brisk None         Visual Fields (Counting fingers)       Left Right    Full Full         Extraocular Movement       Right Left    Full, Ortho Full, Ortho         Neuro/Psych     Oriented x3:  Yes   Mood/Affect: Normal         Dilation     Both eyes: 1.0% Mydriacyl, 2.5% Phenylephrine @ 2:33 PM           Slit Lamp and Fundus Exam     Slit Lamp Exam       Right Left   Lids/Lashes Dermatochalasis - upper  lid, mild MGD Dermatochalasis - upper lid, mild MGD   Conjunctiva/Sclera nasal pingeucula, Melanosis nasal pingeucula, Melanosis   Cornea 2-3+ Punctate epithelial erosions, well healed cataract wound 2-3+ Punctate epithelial erosions, well healed cataract wound   Anterior Chamber Deep and quiet Deep and quiet   Iris round and poorly dilated to 11m, No NVI round and poorly dilated to 473m No NVI   Lens PC IOL in good position PC IOL in good position   Anterior Vitreous Mild Vitreous syneresis, Posterior vitreous detachment, blood stained vitreous condensations inferiorly -- clearing centrally and settling inferiorly Mild Vitreous syneresis, Posterior vitreous detachment, vitreous condensations         Fundus Exam       Right Left   Disc mild Pallor, Sharp rim, +cupping mild Pallor, Sharp rim, +cupping, focal PPP temporal, vascular loops early NVD   C/D Ratio 0.6 0.6   Macula Flat, Blunted foveal reflex, scattered Microaneurysms, trace cystic changes - stably improved, RPE mottling and clumping Flat, good foveal reflex, rare MA, mild cystic changes temporal mac   Vessels attenuated, Tortuous attenuated, Tortuous, no obvious NV, Copper wiring   Periphery Attached, +NV along inferior arcades and nasal midzone - improved, 360 MA/DBH, pre-retinal hemes settled inferiorly - improved, good 360 PRP changes -- room for temporal fill in, scattered fibrosis Attached, 360 MA/DBH, focal exudates nasal midzone, no PRP laser           Refraction     Wearing Rx       Sphere Cylinder Axis Add   Right -0.75 +1.25 004 +2.75   Left -0.75 +1.25 004 +2.75    Type: PAL           IMAGING AND PROCEDURES  Imaging and Procedures for 12/07/2021  OCT, Retina - OU - Both Eyes        Right Eye Quality was good. Central Foveal Thickness: 283. Progression has been stable. Findings include no IRF, no SRF, abnormal foveal contour, epiretinal membrane, preretinal fibrosis (Stable improvement in vitreous opacities-- persistent inferiorly, pre-retinal fibrosis inferiorly, stable improvement in cystic changes temporal macula).   Left Eye Quality was good. Central Foveal Thickness: 268. Progression has been stable. Findings include normal foveal contour, no SRF, intraretinal hyper-reflective material, intraretinal fluid (persistent IRF/IRHM temporal macula).   Notes *Images captured and stored on drive  Diagnosis / Impression:  OD: Stable improvement in vitreous opacities-- persistent inferiorly, pre-retinal fibrosis inferiorly, stable improvement in cystic changes temporal macula OS: persistent IRF/IRHM temporal macula  Clinical management:  See below  Abbreviations: NFP - Normal foveal profile. CME - cystoid macular edema. PED - pigment epithelial detachment. IRF - intraretinal fluid. SRF - subretinal fluid. EZ - ellipsoid zone. ERM - epiretinal membrane. ORA - outer retinal atrophy. ORT - outer retinal tubulation. SRHM - subretinal hyper-reflective material. IRHM - intraretinal hyper-reflective material      Fluorescein Angiography Optos (Transit OD)       Right Eye Progression has improved. Early phase findings include delayed filling, staining, microaneurysm, vascular perfusion defect. Mid/Late phase findings include leakage, staining, microaneurysm, vascular perfusion defect (Interval improvement in NVE (inf arcades and nasal midzone); vascular nonperfusion and perivascular leakage temp periphery and macula, room for PRP fill in temp periphery).   Left Eye Progression has worsened. Early phase findings include leakage, microaneurysm, neovascularization disc, vascular perfusion defect (Mild NVD). Mid/Late phase findings include leakage, microaneurysm, vascular  perfusion defect (Mild NVD, vascular non-perfusion and perivascular leakage temporal periphery).   Notes **Images stored on drive**  Impression: PDR OU OD: Interval improvement in NVE (inf arcades and nasal midzone); vascular nonperfusion and perivascular leakage temp periphery and macula, room for PRP fill in temp periphery OS: Mild NVD, vascular non-perfusion and perivascular leakage temporal periphery            ASSESSMENT/PLAN:    ICD-10-CM   1. Proliferative diabetic retinopathy of both eyes with macular edema associated with type 2 diabetes mellitus (HCC)  E11.3513 OCT, Retina - OU - Both Eyes    Fluorescein Angiography Optos (Transit OD)    CANCELED: Flourescein Angiography - OU - Both Eyes    CANCELED: Intravitreal Injection, Pharmacologic Agent - OD - Right Eye    2. Vitreous hemorrhage, right eye (HCC)  H43.11     3. Epiretinal membrane (ERM) of right eye  H35.371     4. Essential hypertension  I10     5. Hypertensive retinopathy of both eyes  H35.033     6. Pseudophakia, both eyes  Z96.1     7. Ocular hypertension of right eye  H40.051      1,2. Proliferative diabetic retinopathy OU w/ vitreous hemorrhage, OD   - delayed f/u -- 14 wks instead of 10  - s/p IVA OD #1 (12.19.22), #2 (01.16.23), #3 (02.16.23), #4 (04.04.23)  - s/p PRP OD (02.27.23)  - referred by [facility] -- formerly followed by Dr. Anderson Malta - BCVA 20/25 OD - decreased from 20/20; 20/20 OS (improved) - FA (09.05.23) OD: Interval improvement in NVE (inf arcades and nasal midzone); vascular nonperfusion and perivascular leakage temp periphery and macula, room for PRP fill in temp periphery; OS: Mild NVD, vascular non-perfusion and perivascular leakage temporal periphery **pt would benefit from PRP fill in OD and PRP OS** - OCT today shows OD: Stable improvement in vitreous opacities-- persistent inferiorly, pre-retinal fibrosis inferiorly, stable improvement in cystic changes temporal macula; OS: persistent  IRF/IRHM temporal macula at 14 wks - recommend IVA OS #1 today for new NVD and +DME; also recommend PRP fill in OD - pt wishes to defer procedures today - f/u next week for DFE, OCT, possible injection OS and fill in PRP OS - VH precautions reviewed -- minimize activities, keep head elevated, avoid ASA/NSAIDs/blood thinners as able  - f/u Wednesday, 09.13.23, 945am  3. Epiretinal membrane, right eye  - mild ERM OD - BCVA 20/25 - asymptomatic, no metamorphopsia - no indication for surgery at this time - continue to monitor  4,5. Hypertensive retinopathy OU - discussed importance of tight BP control - continue to monitor  6. Pseudophakia OU  - s/p CE/IOL OU  - IOLs in good position, doing well  - continue to monitor  7. Ocular Hypertension OD  - improved to 20 from 22 OD on Brim BID OD  - continue Brimonidine BID OD  Ophthalmic Meds Ordered this visit:  No orders of the defined types were placed in this encounter.    Return in about 8 days (around 12/15/2021) for possible injection IVA OS and PRP fillin OD .  There are no Patient Instructions on file for this visit.   Explained the diagnoses, plan, and follow up with the patient and they expressed understanding.  Patient expressed understanding of the importance of proper follow up care.   This document serves as a record of services personally performed by Gardiner Sleeper, MD, PhD. It was created on their behalf by Renaldo Reel, [place] an ophthalmic technician. The creation of this record is the provider's dictation and/or activities during  the visit.    Electronically signed by:  Renaldo Reel, COT  11/25/21 4:35 PM  Gardiner Sleeper, M.D., Ph.D. Diseases & Surgery of the Retina and Vitreous [facility]  I have reviewed the above documentation for accuracy and completeness, and I agree with the above. Gardiner Sleeper, M.D., Ph.D. 12/07/21 4:43 PM   Abbreviations: M myopia (nearsighted); A  astigmatism; H hyperopia (farsighted); P presbyopia; Mrx spectacle prescription;  CTL contact lenses; OD right eye; OS left eye; OU both eyes  XT exotropia; ET esotropia; PEK punctate epithelial keratitis; PEE punctate epithelial erosions; DES dry eye syndrome; MGD meibomian gland dysfunction; ATs artificial tears; PFAT's preservative free artificial tears; [place] nuclear sclerotic cataract; PSC posterior subcapsular cataract; ERM epi-retinal membrane; PVD posterior vitreous detachment; RD retinal detachment; DM diabetes mellitus; DR diabetic retinopathy; NPDR non-proliferative diabetic retinopathy; PDR proliferative diabetic retinopathy; CSME clinically significant macular edema; DME diabetic macular edema; dbh dot blot hemorrhages; CWS cotton wool spot; POAG primary open angle glaucoma; C/D cup-to-disc ratio; HVF humphrey visual field; GVF goldmann visual field; OCT optical coherence tomography; IOP intraocular pressure; BRVO Branch retinal vein occlusion; CRVO central retinal vein occlusion; CRAO central retinal artery occlusion; BRAO branch retinal artery occlusion; RT retinal tear; SB scleral buckle; PPV pars plana vitrectomy; VH Vitreous hemorrhage; PRP panretinal laser photocoagulation; IVK intravitreal kenalog; VMT vitreomacular traction; MH Macular hole;  NVD neovascularization of the disc; NVE neovascularization elsewhere; AREDS age related eye disease study; ARMD age related macular degeneration; POAG primary open angle glaucoma; EBMD epithelial/anterior basement membrane dystrophy; ACIOL anterior chamber intraocular lens; IOL intraocular lens; PCIOL posterior chamber intraocular lens; Phaco/IOL phacoemulsification with intraocular lens placement; [place] photorefractive keratectomy; LASIK laser assisted in situ keratomileusis; HTN hypertension; DM diabetes mellitus; COPD chronic obstructive pulmonary disease

## 2021-12-07 ENCOUNTER — Encounter (INDEPENDENT_AMBULATORY_CARE_PROVIDER_SITE_OTHER): Payer: Self-pay | Admitting: Ophthalmology

## 2021-12-07 ENCOUNTER — Ambulatory Visit (INDEPENDENT_AMBULATORY_CARE_PROVIDER_SITE_OTHER): Payer: No Typology Code available for payment source | Admitting: Ophthalmology

## 2021-12-07 DIAGNOSIS — H4311 Vitreous hemorrhage, right eye: Secondary | ICD-10-CM | POA: Diagnosis not present

## 2021-12-07 DIAGNOSIS — I10 Essential (primary) hypertension: Secondary | ICD-10-CM

## 2021-12-07 DIAGNOSIS — Z961 Presence of intraocular lens: Secondary | ICD-10-CM

## 2021-12-07 DIAGNOSIS — H35033 Hypertensive retinopathy, bilateral: Secondary | ICD-10-CM

## 2021-12-07 DIAGNOSIS — E113511 Type 2 diabetes mellitus with proliferative diabetic retinopathy with macular edema, right eye: Secondary | ICD-10-CM

## 2021-12-07 DIAGNOSIS — E113513 Type 2 diabetes mellitus with proliferative diabetic retinopathy with macular edema, bilateral: Secondary | ICD-10-CM | POA: Diagnosis not present

## 2021-12-07 DIAGNOSIS — H40051 Ocular hypertension, right eye: Secondary | ICD-10-CM

## 2021-12-07 DIAGNOSIS — H35371 Puckering of macula, right eye: Secondary | ICD-10-CM

## 2021-12-07 DIAGNOSIS — E113412 Type 2 diabetes mellitus with severe nonproliferative diabetic retinopathy with macular edema, left eye: Secondary | ICD-10-CM

## 2021-12-09 NOTE — Progress Notes (Signed)
[facility] Note  12/15/2021    CHIEF COMPLAINT Patient presents for Retina Follow Up   HISTORY OF PRESENT ILLNESS: Matthew Sellers is a 81 y.o. male who presents to the clinic today for:   HPI     Retina Follow Up   Patient presents with  Diabetic Retinopathy.  In both eyes.  Severity is moderate.  Duration of 8 days.  Since onset it is stable.  I, the attending physician,  performed the HPI with the patient and updated documentation appropriately.        Comments   Pt here for 8 day ret f/u PDR OU. IVA and PRP. Pt states [facility] is the same.       Last edited by Bernarda Caffey, MD on 12/15/2021  1:10 PM.     Referring physician: No referring provider defined for this encounter.  HISTORICAL INFORMATION:   Selected notes from the MEDICAL RECORD NUMBER Ref[facility] by [facility] for DM exam LEE:  Ocular Hx- PMH   CURRENT MEDICATIONS: Current Outpatient Medications (Ophthalmic Drugs)  Medication Sig   brimonidine (ALPHAGAN) 0.2 % ophthalmic solution Place 1 drop into the right eye 2 (two) times daily.   Brinzolamide-Brimonidine 1-0.2 % SUSP Place 1 drop into both eyes 2 (two) times daily.   carboxymethylcellulose (REFRESH PLUS) 0.5 % SOLN INSTILL 1 DROP IN BOTH EYES FIVE TIMES A DAY   latanoprost (XALATAN) 0.005 % ophthalmic solution Place 1 drop into both eyes at bedtime.   Latanoprost 0.005 % EMUL    timolol (TIMOPTIC-XR) 0.5 % ophthalmic gel-forming SMARTSIG:In Eye(s)   No current facility-administered medications for this visit. (Ophthalmic Drugs)   Current Outpatient Medications (Other)  Medication Sig   amLODipine (NORVASC) 10 MG tablet Take 10 mg by mouth daily.   aspirin EC 81 MG tablet Take 81 mg by mouth daily.   cefpodoxime (VANTIN) 200 MG tablet    gabapentin (NEURONTIN) 100 MG capsule Take 2 capsules by mouth at bedtime.   insulin glargine (LANTUS) 100 UNIT/ML injection Inject 30 Units into the skin at bedtime.    insulin glargine-yfgn  (SEMGLEE) 100 UNIT/ML Pen INJECT 36 UNITS SUBCUTANEOUSLY DAILY FOR DIABETES (CONVERTED FROM LANTUS)   JARDIANCE 25 MG TABS tablet Take 1 tablet by mouth daily.   Krill Oil 300 MG CAPS Take 300 mg by mouth daily.   lidocaine (LIDODERM) 5 % Place 1 patch onto the skin daily. Remove & Discard patch within 12 hours or as directed by MD   lisinopril (ZESTRIL) 20 MG tablet Take 2 tablets by mouth daily.   lisinopril-hydrochlorothiazide (ZESTORETIC) 20-25 MG tablet Take 2 tablets by mouth every morning.   metFORMIN (GLUCOPHAGE) 1000 MG tablet Take 1,000 mg by mouth 2 (two) times daily with a meal.   pravastatin (PRAVACHOL) 20 MG tablet Take 20 mg by mouth at bedtime.   PRECISION XTRA TEST STRIPS test strip    fluticasone (FLONASE) 50 MCG/ACT nasal spray Place 2 sprays into both nostrils daily as needed for allergies or rhinitis.   gabapentin (NEURONTIN) 100 MG capsule Take 1 capsule by mouth 3 (three) times daily.   HYDROcodone-acetaminophen (NORCO/VICODIN) 5-325 MG tablet Take 1-2 tablets by mouth every 4 (four) hours as needed for moderate pain.   nitroGLYCERIN (NITROSTAT) 0.4 MG SL tablet Place 0.4 mg under the tongue every 5 (five) minutes as needed for chest pain.   No current facility-administered medications for this visit. (Other)   REVIEW OF SYSTEMS: ROS   Positive for:  Genitourinary, Endocrine, Cardiovascular, Eyes Negative for: Constitutional, Gastrointestinal, Neurological, Skin, Musculoskeletal, HENT, Respiratory, Psychiatric, Allergic/Imm, Heme/Lymph Last edited by Kingsley Spittle, COT on 12/15/2021  9:49 AM.     ALLERGIES No Known Allergies  PAST MEDICAL HISTORY Past Medical History:  Diagnosis Date   Anemia    Bradycardia    CAD (coronary artery disease)    Chest tightness    Chronic kidney disease    Diabetes mellitus without complication (HCC)    Diabetic retinopathy ([place])    Dyslipidemia    Dyspnea on exertion    Glaucoma    Hypertension    Obesity     Paroxysmal atrial fibrillation ([place])    a. identified on device interrogation   Pneumonia    Past Surgical History:  Procedure Laterality Date   ACHILLES TENDON SURGERY     for rupture   CARDIAC CATHETERIZATION  07/08/2009   mild to mod . prox LAD 50% (Dr. Felton Clinton)   CATARACT EXTRACTION     CHOLECYSTECTOMY N/A 10/11/2017   Procedure: LAPAROSCOPIC CHOLECYSTECTOMY WITH INTRAOPERATIVE CHOLANGIOGRAM;  Surgeon: Alphonsa Overall, MD;  Location: [place];  Service: General;  Laterality: N/A;   DOPPLER ECHOCARDIOGRAPHY  04/10/2008   LVEF 70-80% ,norm   EYE SURGERY     PACEMAKER IMPLANT N/A 10/12/2017   Procedure: PACEMAKER IMPLANT;  Surgeon: Evans Lance, MD;  Location: [facility];  Service: Cardiovascular;  Laterality: N/A;   stress myoview  04/09/2008   EF 54%; LV  norm   TEMPORARY PACEMAKER N/A 10/11/2017   Procedure: TEMPORARY PACEMAKER;  Surgeon: Evans Lance, MD;  Location: [facility];  Service: Cardiovascular;  Laterality: N/A;   FAMILY HISTORY Family History  Problem Relation Age of Onset   Leukemia Mother    SOCIAL HISTORY Social History   Tobacco Use   Smoking status: Never   Smokeless tobacco: Never  Vaping Use   Vaping Use: Never used  Substance Use Topics   Alcohol use: No    Alcohol/week: 0.0 standard drinks of alcohol   Drug use: No       OPHTHALMIC EXAM:  Base Eye Exam     Visual Acuity (Snellen - Linear)       Right Left   Dist cc 20/25 -1 20/25 -2   Dist ph cc NI NI    Correction: Glasses         Tonometry (Tonopen, 9:53 AM)       Right Left   Pressure 23 16         Pupils       Dark Light Shape React APD   Right 2 1 Round Brisk None   Left 2 1 Round Brisk None         Visual Fields (Counting fingers)       Left Right    Full Full         Extraocular Movement       Right Left    Full, Ortho Full, Ortho         Neuro/Psych     Oriented x3: Yes   Mood/Affect: Normal         Dilation     Both  eyes: 1.0% Mydriacyl, 2.5% Phenylephrine @ 9:53 AM           Slit Lamp and Fundus Exam     Slit Lamp Exam       Right Left   Lids/Lashes Dermatochalasis - upper lid, mild MGD Dermatochalasis - upper  lid, mild MGD   Conjunctiva/Sclera nasal pingeucula, Melanosis nasal pingeucula, Melanosis   Cornea 2-3+ Punctate epithelial erosions, well healed cataract wound 2-3+ Punctate epithelial erosions, well healed cataract wound   Anterior Chamber Deep and quiet Deep and quiet   Iris round and poorly dilated to 55m, No NVI round and poorly dilated to 4106m No NVI   Lens PC IOL in good position PC IOL in good position   Anterior Vitreous Mild Vitreous syneresis, Posterior vitreous detachment, blood stained vitreous condensations inferiorly -- clearing centrally and settling inferiorly Mild Vitreous syneresis, Posterior vitreous detachment, vitreous condensations         Fundus Exam       Right Left   Disc mild Pallor, Sharp rim, +cupping mild Pallor, Sharp rim, +cupping, focal PPP temporal, vascular loops early NVD   C/D Ratio 0.6 0.5   Macula Flat, Blunted foveal reflex, scattered Microaneurysms, trace cystic changes - slighlty increased, RPE mottling and clumping Flat, good foveal reflex, rare MA/DBH, mild cystic changes temporal mac   Vessels attenuated, Tortuous attenuated, Tortuous, no obvious NV, Copper wiring   Periphery Attached, +NV along inferior arcades and nasal midzone - improved, 360 MA/DBH, pre-retinal hemes settled inferiorly - improved, good 360 PRP changes -- room for temporal fill in, scattered fibrosis Attached, 360 MA/DBH, focal exudates nasal midzone, no PRP laser           Refraction     Wearing Rx       Sphere Cylinder Axis Add   Right -0.75 +1.25 004 +2.75   Left -0.75 +1.25 004 +2.75    Type: PAL           IMAGING AND PROCEDURES  Imaging and Procedures for 12/15/2021  OCT, Retina - OU - Both Eyes       Right Eye Quality was good. Central Foveal  Thickness: 287. Progression has worsened. Findings include no IRF, no SRF, abnormal foveal contour, epiretinal membrane, preretinal fibrosis (Stable improvement in vitreous opacities-- persistent inferiorly, pre-retinal fibrosis inferiorly, persistent cystic changes temporal fovea -- slightly increased).   Left Eye Quality was borderline. Central Foveal Thickness: 270. Progression has improved. Findings include normal foveal contour, no SRF, intraretinal hyper-reflective material, intraretinal fluid (persistent IRF/IRHM temporal macula -- slightly improved).   Notes *Images captured and stored on drive  Diagnosis / Impression:  OD: Stable improvement in vitreous opacities-- persistent inferiorly, pre-retinal fibrosis inferiorly, persistent cystic changes temporal fovea -- slightly increased OS: persistent IRF/IRHM temporal macula -- slightly improved  Clinical management:  See below  Abbreviations: NFP - Normal foveal profile. CME - cystoid macular edema. PED - pigment epithelial detachment. IRF - intraretinal fluid. SRF - subretinal fluid. EZ - ellipsoid zone. ERM - epiretinal membrane. ORA - outer retinal atrophy. ORT - outer retinal tubulation. SRHM - subretinal hyper-reflective material. IRHM - intraretinal hyper-reflective material      Intravitreal Injection, Pharmacologic Agent - OD - Right Eye       Time Out 12/15/2021. 10:55 AM. Confirmed correct patient, procedure, site, and patient consented.   Anesthesia Topical anesthesia was used. Anesthetic medications included Lidocaine 2%, Proparacaine 0.5%.   Procedure Preparation included 5% betadine to ocular surface, eyelid speculum. A supplied needle was used.   Injection: 1.25 mg Bevacizumab 1.2516m.05ml   Route: Intravitreal, Site: Right Eye   NDC: 5: 40768-088-11ot: [id number] , Expiration date: 02/02/2022   Post-op Post injection exam found visual acuity of at least counting fingers. The patient tolerated the procedure  well. There were no complications. The  patient received written and verbal post procedure care education. Post injection medications were not given.      Intravitreal Injection, Pharmacologic Agent - OS - Left Eye       Time Out 12/15/2021. 10:57 AM. Confirmed correct patient, procedure, site, and patient consented.   Anesthesia Topical anesthesia was used. Anesthetic medications included Lidocaine 2%, Proparacaine 0.5%.   Procedure Preparation included 5% betadine to ocular surface, eyelid speculum. A (32g) needle was used.   Injection: 1.25 mg Bevacizumab 1.36m/0.05ml   Route: Intravitreal, Site: Left Eye   NDC:: 69450-388-82 Lot:: [id number] Expiration date: 01/25/2022   Post-op Post injection exam found visual acuity of at least counting fingers. The patient tolerated the procedure well. There were no complications. The patient received written and verbal post procedure care education.            ASSESSMENT/PLAN:    ICD-10-CM   1. Proliferative diabetic retinopathy of both eyes with macular edema associated with type 2 diabetes mellitus (HCC)  E11.3513 OCT, Retina - OU - Both Eyes    Intravitreal Injection, Pharmacologic Agent - OD - Right Eye    Intravitreal Injection, Pharmacologic Agent - OS - Left Eye    Bevacizumab (AVASTIN) SOLN 1.25 mg    Bevacizumab (AVASTIN) SOLN 1.25 mg    2. Vitreous hemorrhage, right eye (HCC)  H43.11     3. Epiretinal membrane (ERM) of right eye  H35.371     4. Essential hypertension  I10     5. Hypertensive retinopathy of both eyes  H35.033     6. Pseudophakia, both eyes  Z96.1     7. Ocular hypertension of right eye  H40.051       1,2. Proliferative diabetic retinopathy OU w/ vitreous hemorrhage, OD   - s/p IVA OD #1 (12.19.22), #2 (01.16.23), #3 (02.16.23), #4 (04.04.23)  - s/p PRP OD (02.27.23)  [facility] by Dr. G[facility]- BCVA 20/25 OD - decreased from 20/20; 20/20 OS (improved) - FA (09.05.23) OD:  Interval improvement in NVE (inf arcades and nasal midzone); vascular nonperfusion and perivascular leakage temp periphery and macula, room for PRP fill in temp periphery; OS: Mild NVD, vascular non-perfusion and perivascular leakage temporal periphery **pt would benefit from PRP fill in OD and PRP OS** - OCT today shows OD: Stable improvement in vitreous opacities-- persistent inferiorly, pre-retinal fibrosis inferiorly, persistent cystic changes temporal fovea -- slightly increased; OS: persistent IRF/IRHM temporal macula -- slightly improved - recommend IVA OD # 5 and OS #1 today, 09.13.23 for NVD and +DME - pt wishes to proceed with injections - RBA of procedure discussed, questions answered - IVA informed consent obtained and signed, 09.13.23 (OS) - see procedure note - f/u next week for DFE, OCT, PRP (either fill in OD, or full OS) - VH precautions reviewed -- minimize activities, keep head elevated, avoid ASA/NSAIDs/blood thinners as able  - f/u Tuesday, 09.19.23, 245am, PRP (either fill in OD, or full OS)  3. Epiretinal membrane, right eye  - mild ERM OD - BCVA 20/25 - asymptomatic, no metamorphopsia - no indication for surgery at this time - continue to monitor  4,5. Hypertensive retinopathy OU - discussed importance of tight BP control - continue to monitor  6. Pseudophakia OU  - s/p CE/IOL OU  - IOLs in good position, doing well  - continue to monitor  7. Ocular Hypertension OD  - 23 OD on Brim BID OD  - continue Brimonidine BID OD  Ophthalmic Meds Ordered this visit:  Meds ordered this encounter  Medications   Bevacizumab (AVASTIN) SOLN 1.25 mg   Bevacizumab (AVASTIN) SOLN 1.25 mg     Return in about 6 days (around 12/21/2021) for f/u PDR OU, DFE, OCT.  There are no Patient Instructions on file for this visit.   Explained the diagnoses, plan, and follow up with the patient and they expressed understanding.  Patient expressed understanding of the importance of  proper follow up care.   This document serves as a record of services personally performed by Gardiner Sleeper, MD, PhD. It was created on their behalf by Orvan Falconer, an ophthalmic technician. The creation of this record is the provider's dictation and/or activities during the visit.    Electronically signed by: Orvan Falconer, OA, 12/15/21  1:14 PM  This document serves as a record of services personally performed by Gardiner Sleeper, MD, PhD. It was created on their behalf by San Jetty. Owens Shark, OA an ophthalmic technician. The creation of this record is the provider's dictation and/or activities during the visit.    Electronically signed by: San Jetty. Owens Shark, [place] 09.13.2023 1:14 PM  Gardiner Sleeper, M.D., Ph.D. Diseases & Surgery of the Retina and Vitreous [facility]  I have reviewed the above documentation for accuracy and completeness, and I agree with the above. Gardiner Sleeper, M.D., Ph.D. 12/15/21 1:16 PM   Abbreviations: M myopia (nearsighted); A astigmatism; H hyperopia (farsighted); P presbyopia; Mrx spectacle prescription;  CTL contact lenses; OD right eye; OS left eye; OU both eyes  XT exotropia; ET esotropia; PEK punctate epithelial keratitis; PEE punctate epithelial erosions; DES dry eye syndrome; MGD meibomian gland dysfunction; ATs artificial tears; PFAT's preservative free artificial tears; [place] nuclear sclerotic cataract; PSC posterior subcapsular cataract; ERM epi-retinal membrane; PVD posterior vitreous detachment; RD retinal detachment; DM diabetes mellitus; DR diabetic retinopathy; NPDR non-proliferative diabetic retinopathy; PDR proliferative diabetic retinopathy; CSME clinically significant macular edema; DME diabetic macular edema; dbh dot blot hemorrhages; CWS cotton wool spot; POAG primary open angle glaucoma; C/D cup-to-disc ratio; HVF humphrey visual field; GVF goldmann visual field; OCT optical coherence tomography; IOP intraocular pressure;  BRVO Branch retinal vein occlusion; CRVO central retinal vein occlusion; CRAO central retinal artery occlusion; BRAO branch retinal artery occlusion; RT retinal tear; SB scleral buckle; PPV pars plana vitrectomy; VH Vitreous hemorrhage; PRP panretinal laser photocoagulation; IVK intravitreal kenalog; VMT vitreomacular traction; MH Macular hole;  NVD neovascularization of the disc; NVE neovascularization elsewhere; AREDS age related eye disease study; ARMD age related macular degeneration; POAG primary open angle glaucoma; EBMD epithelial/anterior basement membrane dystrophy; ACIOL anterior chamber intraocular lens; IOL intraocular lens; PCIOL posterior chamber intraocular lens; Phaco/IOL phacoemulsification with intraocular lens placement; [place] photorefractive keratectomy; LASIK laser assisted in situ keratomileusis; HTN hypertension; DM diabetes mellitus; COPD chronic obstructive pulmonary disease

## 2021-12-15 ENCOUNTER — Encounter (INDEPENDENT_AMBULATORY_CARE_PROVIDER_SITE_OTHER): Payer: Self-pay | Admitting: Ophthalmology

## 2021-12-15 ENCOUNTER — Ambulatory Visit (INDEPENDENT_AMBULATORY_CARE_PROVIDER_SITE_OTHER): Payer: No Typology Code available for payment source | Admitting: Ophthalmology

## 2021-12-15 DIAGNOSIS — H35371 Puckering of macula, right eye: Secondary | ICD-10-CM

## 2021-12-15 DIAGNOSIS — H35033 Hypertensive retinopathy, bilateral: Secondary | ICD-10-CM

## 2021-12-15 DIAGNOSIS — E113511 Type 2 diabetes mellitus with proliferative diabetic retinopathy with macular edema, right eye: Secondary | ICD-10-CM

## 2021-12-15 DIAGNOSIS — E113513 Type 2 diabetes mellitus with proliferative diabetic retinopathy with macular edema, bilateral: Secondary | ICD-10-CM | POA: Diagnosis not present

## 2021-12-15 DIAGNOSIS — I10 Essential (primary) hypertension: Secondary | ICD-10-CM | POA: Diagnosis not present

## 2021-12-15 DIAGNOSIS — H4311 Vitreous hemorrhage, right eye: Secondary | ICD-10-CM | POA: Diagnosis not present

## 2021-12-15 DIAGNOSIS — E113412 Type 2 diabetes mellitus with severe nonproliferative diabetic retinopathy with macular edema, left eye: Secondary | ICD-10-CM

## 2021-12-15 DIAGNOSIS — Z961 Presence of intraocular lens: Secondary | ICD-10-CM

## 2021-12-15 DIAGNOSIS — H40051 Ocular hypertension, right eye: Secondary | ICD-10-CM

## 2021-12-15 DIAGNOSIS — E113591 Type 2 diabetes mellitus with proliferative diabetic retinopathy without macular edema, right eye: Secondary | ICD-10-CM

## 2021-12-15 MED ORDER — BEVACIZUMAB CHEMO INJECTION 1.25MG/0.05ML SYRINGE FOR KALEIDOSCOPE
1.2500 mg | INTRAVITREAL | Status: AC | PRN
  Administered 2021-12-15: 1.25 mg via INTRAVITREAL

## 2021-12-20 NOTE — Progress Notes (Signed)
[facility] Note  12/21/2021    CHIEF COMPLAINT Patient presents for Retina Follow Up   HISTORY OF PRESENT ILLNESS: Matthew Sellers is a 81 y.o. male who presents to the clinic today for:   HPI     Retina Follow Up   Patient presents with  Diabetic Retinopathy.  In both eyes.  Severity is moderate.  Duration of 6 days.  Since onset it is stable.  I, the attending physician,  performed the HPI with the patient and updated documentation appropriately.        Comments   Patient states vision the same OU. BS was 81 yesterday am.       Last edited by Bernarda Caffey, MD on 12/21/2021  4:27 PM.      Referring physician: No referring provider defined for this encounter.  HISTORICAL INFORMATION:   Selected notes from the MEDICAL RECORD NUMBER [facility]EE:  Ocular Hx- PMH   CURRENT MEDICATIONS: Current Outpatient Medications (Ophthalmic Drugs)  Medication Sig   brimonidine (ALPHAGAN) 0.2 % ophthalmic solution Place 1 drop into the right eye 2 (two) times daily.   Brinzolamide-Brimonidine 1-0.2 % SUSP Place 1 drop into both eyes 2 (two) times daily.   carboxymethylcellulose (REFRESH PLUS) 0.5 % SOLN INSTILL 1 DROP IN BOTH EYES FIVE TIMES A DAY   latanoprost (XALATAN) 0.005 % ophthalmic solution Place 1 drop into both eyes at bedtime.   Latanoprost 0.005 % EMUL    prednisoLONE acetate (PRED FORTE) 1 % ophthalmic suspension Place 1 drop into the right eye 4 (four) times daily for 7 days.   timolol (TIMOPTIC-XR) 0.5 % ophthalmic gel-forming SMARTSIG:In Eye(s)   No current facility-administered medications for this visit. (Ophthalmic Drugs)   Current Outpatient Medications (Other)  Medication Sig   amLODipine (NORVASC) 10 MG tablet Take 10 mg by mouth daily.   aspirin EC 81 MG tablet Take 81 mg by mouth daily.   cefpodoxime (VANTIN) 200 MG tablet    gabapentin (NEURONTIN) 100 MG capsule Take 2 capsules by mouth at bedtime.    insulin glargine (LANTUS) 100 UNIT/ML injection Inject 30 Units into the skin at bedtime.    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   Krill Oil 300 MG CAPS Take 300 mg by mouth daily.   lidocaine (LIDODERM) 5 % Place 1 patch onto the skin daily. Remove & Discard patch within 12 hours or as directed by MD   lisinopril (ZESTRIL) 20 MG tablet Take 2 tablets by mouth daily.   lisinopril-hydrochlorothiazide (ZESTORETIC) 20-25 MG tablet Take 2 tablets by mouth every morning.   metFORMIN (GLUCOPHAGE) 1000 MG tablet Take 1,000 mg by mouth 2 (two) times daily with a meal.   pravastatin (PRAVACHOL) 20 MG tablet Take 20 mg by mouth at bedtime.   PRECISION XTRA TEST STRIPS test strip    fluticasone (FLONASE) 50 MCG/ACT nasal spray Place 2 sprays into both nostrils daily as needed for allergies or rhinitis.   gabapentin (NEURONTIN) 100 MG capsule Take 1 capsule by mouth 3 (three) times daily.   HYDROcodone-acetaminophen (NORCO/VICODIN) 5-325 MG tablet Take 1-2 tablets by mouth every 4 (four) hours as needed for moderate pain.   nitroGLYCERIN (NITROSTAT) 0.4 MG SL tablet Place 0.4 mg under the tongue every 5 (five) minutes as needed for chest pain.  No current facility-administered medications for this visit. (Other)   REVIEW OF SYSTEMS: ROS   Positive for: Genitourinary, Endocrine, Cardiovascular, Eyes Negative for: Constitutional, Gastrointestinal, Neurological, Skin, Musculoskeletal, HENT, Respiratory, Psychiatric, Allergic/Imm, Heme/Lymph Last edited by Jobe Marker, COT on 12/21/2021  2:42 PM.      ALLERGIES No Known Allergies  PAST MEDICAL HISTORY Past Medical History:  Diagnosis Date   Anemia    Bradycardia    CAD (coronary artery disease)    Chest tightness    Chronic kidney disease    Diabetes mellitus without complication (HCC)    Diabetic  retinopathy ([place])    Dyslipidemia    Dyspnea on exertion    Glaucoma    Hypertension    Obesity    Paroxysmal atrial fibrillation ([place])    a. identified on device interrogation   Pneumonia    Past Surgical History:  Procedure Laterality Date   ACHILLES TENDON SURGERY     for rupture   CARDIAC CATHETERIZATION  07/08/2009   mild to mod . prox LAD 50% (Dr. Felton Clinton)   CATARACT EXTRACTION     CHOLECYSTECTOMY N/A 10/11/2017   Procedure: LAPAROSCOPIC CHOLECYSTECTOMY WITH INTRAOPERATIVE CHOLANGIOGRAM;  Surgeon: Alphonsa Overall, MD;  Location: [place];  Service: General;  Laterality: N/A;   DOPPLER ECHOCARDIOGRAPHY  04/10/2008   LVEF 70-80% ,norm   EYE SURGERY     PACEMAKER IMPLANT N/A 10/12/2017   Procedure: PACEMAKER IMPLANT;  Surgeon: Evans Lance, MD;  Location: [facility];  Service: Cardiovascular;  Laterality: N/A;   stress myoview  04/09/2008   EF 54%; LV  norm   TEMPORARY PACEMAKER N/A 10/11/2017   Procedure: TEMPORARY PACEMAKER;  Surgeon: Evans Lance, MD;  Location: [facility];  Service: Cardiovascular;  Laterality: N/A;   FAMILY HISTORY Family History  Problem Relation Age of Onset   Leukemia Mother    SOCIAL HISTORY Social History   Tobacco Use   Smoking status: Never   Smokeless tobacco: Never  Vaping Use   Vaping Use: Never used  Substance Use Topics   Alcohol use: No    Alcohol/week: 0.0 standard drinks of alcohol   Drug use: No       OPHTHALMIC EXAM:  Base Eye Exam     Visual Acuity (Snellen - Linear)       Right Left   Dist cc 20/30 -2 20/30 +2   Dist ph cc 20/25 -2 NI    Correction: Glasses         Tonometry (Tonopen, 2:51 PM)       Right Left   Pressure 23 19         Pupils       Dark Light Shape React APD   Right 2 1 Round Brisk None   Left 2 1 Round Brisk None         Visual Fields (Counting fingers)       Left Right    Full Full         Extraocular Movement       Right Left    Full, Ortho Full,  Ortho         Neuro/Psych     Oriented x3: Yes   Mood/Affect: Normal         Dilation     Both eyes: 1.0% Mydriacyl, 2.5% Phenylephrine @ 2:51 PM           Slit Lamp and Fundus Exam     Slit Lamp Exam  Right Left   Lids/Lashes Dermatochalasis - upper lid, mild MGD Dermatochalasis - upper lid, mild MGD   Conjunctiva/Sclera nasal pingeucula, Melanosis nasal pingeucula, Melanosis   Cornea 2-3+ Punctate epithelial erosions, well healed cataract wound 2-3+ Punctate epithelial erosions, well healed cataract wound   Anterior Chamber Deep and quiet Deep and quiet   Iris round and poorly dilated to 31m, No NVI round and poorly dilated to 468m No NVI   Lens PC IOL in good position PC IOL in good position   Anterior Vitreous Mild Vitreous syneresis, Posterior vitreous detachment, blood stained vitreous condensations inferiorly -- clearing centrally and settling inferiorly Mild Vitreous syneresis, Posterior vitreous detachment, vitreous condensations         Fundus Exam       Right Left   Disc mild Pallor, Sharp rim, +cupping mild Pallor, Sharp rim, +cupping, focal PPP temporal, vascular loops early NVD   C/D Ratio 0.6 0.5   Macula Flat, Blunted foveal reflex, scattered Microaneurysms, trace cystic changes - slighlty increased, RPE mottling and clumping Flat, good foveal reflex, rare MA/DBH, mild cystic changes temporal mac   Vessels attenuated, Tortuous attenuated, Tortuous, no obvious NV, Copper wiring   Periphery Attached, +NV along inferior arcades and nasal midzone - improved, 360 MA/DBH, pre-retinal hemes settled inferiorly - improved, good 360 PRP changes -- room for temporal fill in, scattered fibrosis Attached, 360 MA/DBH, focal exudates nasal midzone, no PRP laser           Refraction     Wearing Rx       Sphere Cylinder Axis Add   Right -0.75 +1.25 004 +2.75   Left -0.75 +1.25 004 +2.75    Type: PAL           IMAGING AND PROCEDURES  Imaging and  Procedures for 12/21/2021  OCT, Retina - OU - Both Eyes       Right Eye Quality was good. Central Foveal Thickness: 286. Progression has improved. Findings include no IRF, no SRF, abnormal foveal contour, epiretinal membrane, preretinal fibrosis (Stable improvement in vitreous opacities-- persistent inferiorly, pre-retinal fibrosis inferiorly, mild interval improvement in cystic changes temporal fovea ).   Left Eye Quality was borderline. Central Foveal Thickness: 266. Progression has improved. Findings include normal foveal contour, no SRF, intraretinal hyper-reflective material, intraretinal fluid (Mild interval improvement in IRF/IRHM temporal macula, persistent vitreous opacities).   Notes *Images captured and stored on drive  Diagnosis / Impression:  OD: Stable improvement in vitreous opacities-- persistent inferiorly, pre-retinal fibrosis inferiorly, mild interval improvement in cystic changes temporal fovea  OS: Mild interval improvement in IRF/IRHM temporal macula, persistent vitreous opacities  Clinical management:  See below  Abbreviations: NFP - Normal foveal profile. CME - cystoid macular edema. PED - pigment epithelial detachment. IRF - intraretinal fluid. SRF - subretinal fluid. EZ - ellipsoid zone. ERM - epiretinal membrane. ORA - outer retinal atrophy. ORT - outer retinal tubulation. SRHM - subretinal hyper-reflective material. IRHM - intraretinal hyper-reflective material      Panretinal Photocoagulation - OD - Right Eye       Time Out Confirmed correct patient, procedure, site, and patient consented.   Anesthesia Topical anesthesia was used. Anesthetic medications included Proparacaine 0.5%.   Notes LASER PROCEDURE NOTE  Diagnosis:   Proliferative Diabetic Retinopathy, RIGHT EYE  Procedure:  Pan-retinal photocoagulation using slit lamp laser, RIGHT EYE, fill in  Anesthesia:  Topical  Surgeon: BrBernarda CaffeyMD, PhD   Informed consent obtained, operative  eye marked, and time out performed  prior to initiation of laser.   Lumenis [id number] slit lamp laser Pattern: 3x3 square Power: 320 mW Duration: 40 msec  Spot size: 200 microns  # spots: 568 spots -- fill in, mostly temporal and superior quads  Complications: None.  Notes: pt with mild head tremor  Patient tolerated the procedure well and received written and verbal post-procedure care information/education.             ASSESSMENT/PLAN:    ICD-10-CM   1. Proliferative diabetic retinopathy of both eyes with macular edema associated with type 2 diabetes mellitus (HCC)  E11.3513 OCT, Retina - OU - Both Eyes    Panretinal Photocoagulation - OD - Right Eye    2. Vitreous hemorrhage, right eye (HCC)  H43.11     3. Epiretinal membrane (ERM) of right eye  H35.371     4. Essential hypertension  I10     5. Hypertensive retinopathy of both eyes  H35.033     6. Pseudophakia, both eyes  Z96.1     7. Ocular hypertension of right eye  H40.051       1,2. Proliferative diabetic retinopathy OU w/ vitreous hemorrhage, OD   - s/p IVA OD #1 (12.19.22), #2 (01.16.23), #3 (02.16.23), #4 (04.04.23), #5 (09.13.23)  - s/p IVA OS #1 (09.13.23)  - s/p PRP OD (02.27.23)  - referred by [facility] -- formerly followed by Dr. Anderson Malta - BCVA 20/25 OD - decreased from 20/20; 20/20 OS (improved) - FA (09.05.23) OD: Interval improvement in NVE (inf arcades and nasal midzone); vascular nonperfusion and perivascular leakage temp periphery and macula, room for PRP fill in temp periphery; OS: Mild NVD, vascular non-perfusion and perivascular leakage temporal periphery **pt would benefit from PRP fill in OD and PRP OS** - OCT today shows OD: Stable improvement in vitreous opacities-- persistent inferiorly, pre-retinal fibrosis inferiorly, mild interval improvement in cystic changes temporal fovea; OS: Mild interval improvement in IRF/IRHM temporal macula, persistent vitreous opacities - recommend PRP fill in OD  today, 09.19.23 - pt wishes to proceed with laser - RBA of procedure discussed, questions answered - IVA informed consent obtained and signed, 09.13.23 (OS) - see procedure note - start PF QID OD x7 days - VH precautions reviewed -- minimize activities, keep head elevated, avoid ASA/NSAIDs/blood thinners as able  - f/u October 11 or later, DFE, OCT, possible injection(s)  3. Epiretinal membrane, right eye  - mild ERM OD - BCVA 20/25 - asymptomatic, no metamorphopsia - no indication for surgery at this time - continue to monitor  4,5. Hypertensive retinopathy OU - discussed importance of tight BP control - continue to monitor  6. Pseudophakia OU  - s/p CE/IOL OU  - IOLs in good position, doing well  - continue to monitor  7. Ocular Hypertension OD  - 23 OD on Brim BID OD  - continue Brimonidine BID OD  Ophthalmic Meds Ordered this visit:  Meds ordered this encounter  Medications   prednisoLONE acetate (PRED FORTE) 1 % ophthalmic suspension    Sig: Place 1 drop into the right eye 4 (four) times daily for 7 days.    Dispense:  10 mL    Refill:  0     Return for f/u October 11 or later, PDR OU, DFE, OCT.  There are no Patient Instructions on file for this visit.   Explained the diagnoses, plan, and follow up with the patient and they expressed understanding.  Patient expressed understanding of the importance of proper follow up care.  This document serves as a record of services personally performed by Gardiner Sleeper, MD, PhD. It was created on their behalf by San Jetty. Owens Shark, OA an ophthalmic technician. The creation of this record is the provider's dictation and/or activities during the visit.    Electronically signed by: San Jetty. Owens Shark, [place] 09.18.2023 4:46 PM  This document serves as a record of services personally performed by Gardiner Sleeper, MD, PhD. It was created on their behalf by San Jetty. Owens Shark, OA an ophthalmic technician. The creation of this record is the  provider's dictation and/or activities during the visit.    Electronically signed by: San Jetty. Owens Shark, [place] 09.19.2023 4:46 PM  Gardiner Sleeper, M.D., Ph.D. Diseases & Surgery of the Retina and Vitreous [facility]  I have reviewed the above documentation for accuracy and completeness, and I agree with the above. Gardiner Sleeper, M.D., Ph.D. 12/21/21 4:47 PM  Abbreviations: M myopia (nearsighted); A astigmatism; H hyperopia (farsighted); P presbyopia; Mrx spectacle prescription;  CTL contact lenses; OD right eye; OS left eye; OU both eyes  XT exotropia; ET esotropia; PEK punctate epithelial keratitis; PEE punctate epithelial erosions; DES dry eye syndrome; MGD meibomian gland dysfunction; ATs artificial tears; PFAT's preservative free artificial tears; [place] nuclear sclerotic cataract; PSC posterior subcapsular cataract; ERM epi-retinal membrane; PVD posterior vitreous detachment; RD retinal detachment; DM diabetes mellitus; DR diabetic retinopathy; NPDR non-proliferative diabetic retinopathy; PDR proliferative diabetic retinopathy; CSME clinically significant macular edema; DME diabetic macular edema; dbh dot blot hemorrhages; CWS cotton wool spot; POAG primary open angle glaucoma; C/D cup-to-disc ratio; HVF humphrey visual field; GVF goldmann visual field; OCT optical coherence tomography; IOP intraocular pressure; BRVO Branch retinal vein occlusion; CRVO central retinal vein occlusion; CRAO central retinal artery occlusion; BRAO branch retinal artery occlusion; RT retinal tear; SB scleral buckle; PPV pars plana vitrectomy; VH Vitreous hemorrhage; PRP panretinal laser photocoagulation; IVK intravitreal kenalog; VMT vitreomacular traction; MH Macular hole;  NVD neovascularization of the disc; NVE neovascularization elsewhere; AREDS age related eye disease study; ARMD age related macular degeneration; POAG primary open angle glaucoma; EBMD epithelial/anterior basement membrane  dystrophy; ACIOL anterior chamber intraocular lens; IOL intraocular lens; PCIOL posterior chamber intraocular lens; Phaco/IOL phacoemulsification with intraocular lens placement; [place] photorefractive keratectomy; LASIK laser assisted in situ keratomileusis; HTN hypertension; DM diabetes mellitus; COPD chronic obstructive pulmonary disease

## 2021-12-21 ENCOUNTER — Ambulatory Visit (INDEPENDENT_AMBULATORY_CARE_PROVIDER_SITE_OTHER): Payer: No Typology Code available for payment source | Admitting: Ophthalmology

## 2021-12-21 ENCOUNTER — Encounter (INDEPENDENT_AMBULATORY_CARE_PROVIDER_SITE_OTHER): Payer: Self-pay | Admitting: Ophthalmology

## 2021-12-21 DIAGNOSIS — H4311 Vitreous hemorrhage, right eye: Secondary | ICD-10-CM

## 2021-12-21 DIAGNOSIS — I10 Essential (primary) hypertension: Secondary | ICD-10-CM | POA: Diagnosis not present

## 2021-12-21 DIAGNOSIS — H35371 Puckering of macula, right eye: Secondary | ICD-10-CM

## 2021-12-21 DIAGNOSIS — E113513 Type 2 diabetes mellitus with proliferative diabetic retinopathy with macular edema, bilateral: Secondary | ICD-10-CM | POA: Diagnosis not present

## 2021-12-21 DIAGNOSIS — H35033 Hypertensive retinopathy, bilateral: Secondary | ICD-10-CM

## 2021-12-21 DIAGNOSIS — Z961 Presence of intraocular lens: Secondary | ICD-10-CM

## 2021-12-21 DIAGNOSIS — H40051 Ocular hypertension, right eye: Secondary | ICD-10-CM

## 2021-12-21 MED ORDER — PREDNISOLONE ACETATE 1 % OP SUSP
1.0000 [drp] | Freq: Four times a day (QID) | OPHTHALMIC | 0 refills | Status: AC
Start: 2021-12-21 — End: 2021-12-28

## 2021-12-31 IMAGING — DX DG CHEST 1V PORT
1 series · 1 of 1 positions shown · non-contrast
Comparison: None.

CLINICAL DATA: Fever.

EXAM:
PORTABLE CHEST 1 VIEW

[chest]
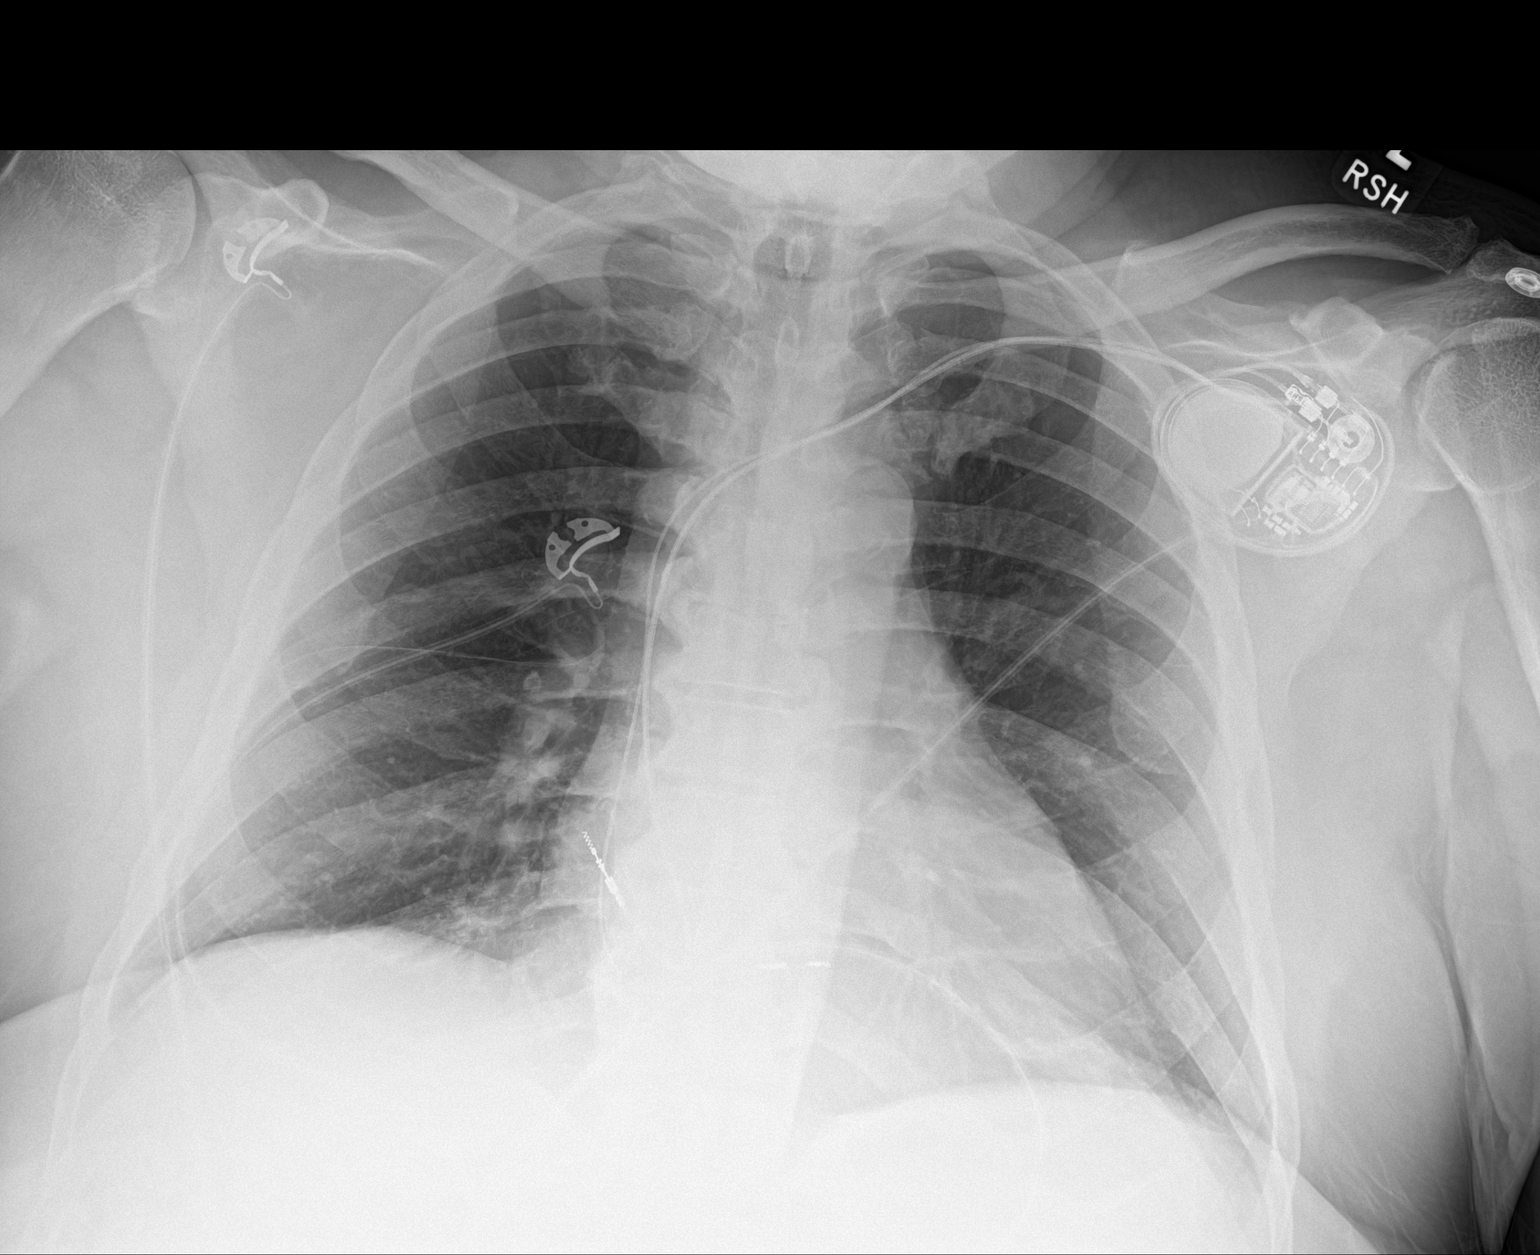

[1 of 1 positions shown; findings below may reference images not displayed]

FINDINGS: Heart size exaggerated by low lung volumes. Minimal bibasilar
atelectasis is present. Lungs are otherwise clear. No edema or
effusion is present.
IMPRESSION: 1. Low lung volumes.
2. No acute cardiopulmonary disease.

## 2022-01-10 NOTE — Progress Notes (Shared)
[facility] Note  01/12/2022    CHIEF COMPLAINT Patient presents for Retina Follow Up   HISTORY OF PRESENT ILLNESS: Matthew Sellers is a 81 y.o. male who presents to the clinic today for:   HPI     Retina Follow Up   Patient presents with  Diabetic Retinopathy.  In both eyes.  This started 3 weeks ago.  I, the attending physician,  performed the HPI with the patient and updated documentation appropriately.        Comments   Patient here for 3 weeks retina follow up for PDR OU. Patient states vision about the same. After left here last time had discomfort for about a week.      Last edited by Bernarda Caffey, MD on 01/12/2022  8:02 PM.       Referring physician: No referring provider defined for this encounter.  HISTORICAL INFORMATION:   Selected notes from the MEDICAL RECORD NUMBER [facility]:  Ocular Hx- PMH   CURRENT MEDICATIONS: Current Outpatient Medications (Ophthalmic Drugs)  Medication Sig   brimonidine (ALPHAGAN) 0.2 % ophthalmic solution Place 1 drop into the right eye 2 (two) times daily.   Brinzolamide-Brimonidine 1-0.2 % SUSP Place 1 drop into both eyes 2 (two) times daily.   carboxymethylcellulose (REFRESH PLUS) 0.5 % SOLN INSTILL 1 DROP IN BOTH EYES FIVE TIMES A DAY   latanoprost (XALATAN) 0.005 % ophthalmic solution Place 1 drop into both eyes at bedtime.   Latanoprost 0.005 % EMUL    timolol (TIMOPTIC-XR) 0.5 % ophthalmic gel-forming SMARTSIG:In Eye(s)   No current facility-administered medications for this visit. (Ophthalmic Drugs)   Current Outpatient Medications (Other)  Medication Sig   amLODipine (NORVASC) 10 MG tablet Take 10 mg by mouth daily.   aspirin EC 81 MG tablet Take 81 mg by mouth daily.   cefpodoxime (VANTIN) 200 MG tablet    gabapentin (NEURONTIN) 100 MG capsule Take 2 capsules by mouth at bedtime.   insulin glargine (LANTUS) 100 UNIT/ML injection Inject 30 Units into the skin  at bedtime.    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   Krill Oil 300 MG CAPS Take 300 mg by mouth daily.   lidocaine (LIDODERM) 5 % Place 1 patch onto the skin daily. Remove & Discard patch within 12 hours or as directed by MD   lisinopril (ZESTRIL) 20 MG tablet Take 2 tablets by mouth daily.   lisinopril-hydrochlorothiazide (ZESTORETIC) 20-25 MG tablet Take 2 tablets by mouth every morning.   metFORMIN (GLUCOPHAGE) 1000 MG tablet Take 1,000 mg by mouth 2 (two) times daily with a meal.   pravastatin (PRAVACHOL) 20 MG tablet Take 20 mg by mouth at bedtime.   PRECISION XTRA TEST STRIPS test strip    fluticasone (FLONASE) 50 MCG/ACT nasal spray Place 2 sprays into both nostrils daily as needed for allergies or rhinitis.   gabapentin (NEURONTIN) 100 MG capsule Take 1 capsule by mouth 3 (three) times daily.   HYDROcodone-acetaminophen (NORCO/VICODIN) 5-325 MG tablet Take 1-2 tablets by mouth every 4 (four) hours as needed for moderate pain.   nitroGLYCERIN (NITROSTAT) 0.4 MG SL tablet Place 0.4 mg under the tongue every 5 (five) minutes as needed for chest pain.   No current facility-administered medications for this visit. (Other)   REVIEW OF SYSTEMS: ROS  Positive for: Genitourinary, Endocrine, Cardiovascular, Eyes Negative for: Constitutional, Gastrointestinal, Neurological, Skin, Musculoskeletal, HENT, Respiratory, Psychiatric, Allergic/Imm, Heme/Lymph Last edited by Theodore Demark, COA on 01/12/2022  1:43 PM.     ALLERGIES No Known Allergies  PAST MEDICAL HISTORY Past Medical History:  Diagnosis Date   Anemia    Bradycardia    CAD (coronary artery disease)    Chest tightness    Chronic kidney disease    Diabetes mellitus without complication (HCC)    Diabetic retinopathy ([place])    Dyslipidemia    Dyspnea on exertion    Glaucoma     Hypertension    Obesity    Paroxysmal atrial fibrillation ([place])    a. identified on device interrogation   Pneumonia    Past Surgical History:  Procedure Laterality Date   ACHILLES TENDON SURGERY     for rupture   CARDIAC CATHETERIZATION  07/08/2009   mild to mod . prox LAD 50% (Dr. Felton Clinton)   CATARACT EXTRACTION     CHOLECYSTECTOMY N/A 10/11/2017   Procedure: LAPAROSCOPIC CHOLECYSTECTOMY WITH INTRAOPERATIVE CHOLANGIOGRAM;  Surgeon: Alphonsa Overall, MD;  Location: [facility];  Service: General;  Laterality: N/A;   DOPPLER ECHOCARDIOGRAPHY  04/10/2008   LVEF 70-80% ,norm   EYE SURGERY     PACEMAKER IMPLANT N/A 10/12/2017   Procedure: PACEMAKER IMPLANT;  Surgeon: Evans Lance, MD;  Location: [facility];  Service: Cardiovascular;  Laterality: N/A;   stress myoview  04/09/2008   EF 54%; LV  norm   TEMPORARY PACEMAKER N/A 10/11/2017   Procedure: TEMPORARY PACEMAKER;  Surgeon: Evans Lance, MD;  Location: [facility];  Service: Cardiovascular;  Laterality: N/A;   FAMILY HISTORY Family History  Problem Relation Age of Onset   Leukemia Mother    SOCIAL HISTORY Social History   Tobacco Use   Smoking status: Never   Smokeless tobacco: Never  Vaping Use   Vaping Use: Never used  Substance Use Topics   Alcohol use: No    Alcohol/week: 0.0 standard drinks of alcohol   Drug use: No       OPHTHALMIC EXAM:  Base Eye Exam     Visual Acuity (Snellen - Linear)       Right Left   Dist cc 20/25 20/25 -2   Dist ph cc NI NI    Correction: Glasses         Tonometry (Tonopen, 1:41 PM)       Right Left   Pressure 22 16         Pupils       Dark Light Shape React APD   Right 2 1 Round Brisk None   Left 2 1 Round Brisk None         Visual Fields (Counting fingers)       Left Right    Full Full         Extraocular Movement       Right Left    Full, Ortho Full, Ortho         Neuro/Psych     Oriented x3: Yes   Mood/Affect: Normal          Dilation     Both eyes: 1.0% Mydriacyl, 2.5% Phenylephrine @ 1:41 PM           Slit Lamp and Fundus Exam     Slit Lamp Exam       Right Left   Lids/Lashes Dermatochalasis - upper lid, mild MGD Dermatochalasis -  upper lid, mild MGD   Conjunctiva/Sclera nasal pingeucula, Melanosis nasal pingeucula, Melanosis   Cornea 2-3+ Punctate epithelial erosions, well healed cataract wound 2-3+ Punctate epithelial erosions, well healed cataract wound   Anterior Chamber Deep and quiet Deep and quiet   Iris round and poorly dilated to 39m, No NVI round and poorly dilated to 437m No NVI   Lens PC IOL in good position PC IOL in good position   Anterior Vitreous Mild Vitreous syneresis, Posterior vitreous detachment, blood stained vitreous condensations inferiorly -- clearing centrally and settling inferiorly Mild Vitreous syneresis, Posterior vitreous detachment, vitreous condensations, interval improvement in blood stained vitreous condensations         Fundus Exam       Right Left   Disc mild Pallor, Sharp rim, +cupping mild Pallor, Sharp rim, +cupping, focal PPP temporal, vascular loops, early NVD -- improved   C/D Ratio 0.6 0.5   Macula Flat, Blunted foveal reflex, scattered Microaneurysms, trace cystic changes - persistent, RPE mottling and clumping Flat, good foveal reflex, rare MA/DBH, mild cystic changes temporal mac   Vessels attenuated, mild tortuosity attenuated, Tortuous, no obvious NV   Periphery Attached, +NV along inferior arcades and nasal midzone - improved, 360 MA/DBH, pre-retinal hemes settled inferiorly - improved, good 360 PRP changes -- room for temporal fill in, scattered fibrosis Attached, 360 MA/DBH, focal exudates nasal midzone, no PRP laser           Refraction     Wearing Rx       Sphere Cylinder Axis Add   Right -0.75 +1.25 004 +2.75   Left -0.75 +1.25 004 +2.75    Type: PAL           IMAGING AND PROCEDURES  Imaging and Procedures for  01/12/2022  OCT, Retina - OU - Both Eyes       Right Eye Quality was good. Central Foveal Thickness: 291. Progression has been stable. Findings include no IRF, no SRF, abnormal foveal contour, epiretinal membrane, preretinal fibrosis (Stable improvement in vitreous opacities -- persistent inferiorly, pre-retinal fibrosis inferiorly -- stable, persistent cystic changes temporal fovea and mac).   Left Eye Quality was borderline. Central Foveal Thickness: 262. Progression has improved. Findings include normal foveal contour, no SRF, intraretinal hyper-reflective material, intraretinal fluid (Mild interval increase in IRF/IRHM temporal macula, mild interval improvement in vitreous opacities).   Notes *Images captured and stored on drive  Diagnosis / Impression:  OD: Stable improvement in vitreous opacities -- persistent inferiorly, pre-retinal fibrosis inferiorly -- stable, persistent cystic changes temporal fovea and mac OS: Mild interval increase in IRF/IRHM temporal macula; interval improvement in vitreous opacities  Clinical management:  See below  Abbreviations: NFP - Normal foveal profile. CME - cystoid macular edema. PED - pigment epithelial detachment. IRF - intraretinal fluid. SRF - subretinal fluid. EZ - ellipsoid zone. ERM - epiretinal membrane. ORA - outer retinal atrophy. ORT - outer retinal tubulation. SRHM - subretinal hyper-reflective material. IRHM - intraretinal hyper-reflective material      Intravitreal Injection, Pharmacologic Agent - OD - Right Eye       Time Out 01/12/2022. 3:13 PM. Confirmed correct patient, procedure, site, and patient consented.   Anesthesia Topical anesthesia was used. Anesthetic medications included Lidocaine 2%, Proparacaine 0.5%.   Procedure Preparation included 5% betadine to ocular surface, eyelid speculum. A supplied needle was used.   Injection: 1.25 mg Bevacizumab 1.2519m.05ml   Route: Intravitreal, Site: Right Eye   NDC:  :  94707-615-18ot: [id number] , Expiration date: 02/13/2022  Post-op Post injection exam found visual acuity of at least counting fingers. The patient tolerated the procedure well. There were no complications. The patient received written and verbal post procedure care education. Post injection medications were not given.      Intravitreal Injection, Pharmacologic Agent - OS - Left Eye       Time Out 01/12/2022. 3:13 PM. Confirmed correct patient, procedure, site, and patient consented.   Anesthesia Topical anesthesia was used. Anesthetic medications included Lidocaine 2%, Proparacaine 0.5%.   Procedure Preparation included 5% betadine to ocular surface, eyelid speculum. A (32g) needle was used.   Injection: 1.25 mg Bevacizumab 1.60m/0.05ml   Route: Intravitreal, Site: Left Eye   NDC:: 24825-003-70 Lot:: [id number] Expiration date: 02/16/2022   Post-op Post injection exam found visual acuity of at least counting fingers. The patient tolerated the procedure well. There were no complications. The patient received written and verbal post procedure care education.            ASSESSMENT/PLAN:    ICD-10-CM   1. Proliferative diabetic retinopathy of both eyes with macular edema associated with type 2 diabetes mellitus (HCC)  E11.3513 OCT, Retina - OU - Both Eyes    Intravitreal Injection, Pharmacologic Agent - OD - Right Eye    Intravitreal Injection, Pharmacologic Agent - OS - Left Eye    Bevacizumab (AVASTIN) SOLN 1.25 mg    Bevacizumab (AVASTIN) SOLN 1.25 mg    2. Vitreous hemorrhage, right eye (HCC)  H43.11     3. Epiretinal membrane (ERM) of right eye  H35.371     4. Essential hypertension  I10     5. Hypertensive retinopathy of both eyes  H35.033     6. Pseudophakia, both eyes  Z96.1     7. Ocular hypertension of right eye  H40.051      1,2. Proliferative diabetic retinopathy OU w/ vitreous hemorrhage, OD   - s/p IVA OD #1 (12.19.22), #2 (01.16.23), #3 (02.16.23),  #4 (04.04.23), #5 (09.13.23)  - s/p IVA OS #1 (09.13.23)  - s/p PRP OD (02.27.23), (09.19.23)  [facility] by Dr. GAnderson [place]- BCVA OD: 20/25 (stable) ; 20/25 OS (decreased) - FA (09.05.23) OD: Interval improvement in NVE (inf arcades and nasal midzone); vascular nonperfusion and perivascular leakage temp periphery and macula, room for PRP fill in temp periphery; OS: Mild NVD, vascular non-perfusion and perivascular leakage temporal periphery **pt would benefit from PRP OS** - OCT today shows OD: Stable improvement in vitreous opacities -- persistent inferiorly, pre-retinal fibrosis inferiorly -- stable, persistent cystic changes temporal fovea and mac; OS: Mild interval increase in IRF/IRHM temporal macula, interval improvement in vitreous opacities at 4 weeks - recommend IVA OU (OD #6 and OS #2) today, 10.11.23 with follow up in 4-6 weeks - pt wishes to proceed with injection - RBA of procedure discussed, questions answered - IVA informed consent obtained and signed, 09.13.23 (OS) - see procedure note  - VH precautions reviewed -- minimize activities, keep head elevated, avoid ASA/NSAIDs/blood thinners as able  - f/u 4-6 weeks, DFE, OCT, possible injection(s)  3. Epiretinal membrane, right eye  - mild ERM OD - BCVA 20/25 - asymptomatic, no metamorphopsia - no indication for surgery at this time - continue to monitor  4,5. Hypertensive retinopathy OU - discussed importance of tight BP control - continue to monitor  6. Pseudophakia OU  - s/p CE/IOL OU  - IOLs in good position, doing well  - continue to monitor  7.  Ocular Hypertension OD  - 22 OD on Brim BID OD  - continue Brimonidine BID OD  Ophthalmic Meds Ordered this visit:  Meds ordered this encounter  Medications   Bevacizumab (AVASTIN) SOLN 1.25 mg   Bevacizumab (AVASTIN) SOLN 1.25 mg     Return for 4-6 wks - PDR w/ DME, Dilated Exam, OCT, Possible Injxn.  There are no Patient Instructions on  file for this visit.   Explained the diagnoses, plan, and follow up with the patient and they expressed understanding.  Patient expressed understanding of the importance of proper follow up care.   This document serves as a record of services personally performed by Gardiner Sleeper, MD, PhD. It was created on their behalf by Orvan Falconer, an ophthalmic technician. The creation of this record is the provider's dictation and/or activities during the visit.    Electronically signed by: Orvan Falconer, OA, 01/12/22  8:06 PM   Gardiner Sleeper, M.D., Ph.D. Diseases & Surgery of the Retina and Vitreous [facility]  I have reviewed the above documentation for accuracy and completeness, and I agree with the above. Gardiner Sleeper, M.D., Ph.D. 01/12/22 8:06 PM  Abbreviations: M myopia (nearsighted); A astigmatism; H hyperopia (farsighted); P presbyopia; Mrx spectacle prescription;  CTL contact lenses; OD right eye; OS left eye; OU both eyes  XT exotropia; ET esotropia; PEK punctate epithelial keratitis; PEE punctate epithelial erosions; DES dry eye syndrome; MGD meibomian gland dysfunction; ATs artificial tears; PFAT's preservative free artificial tears; [place] nuclear sclerotic cataract; PSC posterior subcapsular cataract; ERM epi-retinal membrane; PVD posterior vitreous detachment; RD retinal detachment; DM diabetes mellitus; DR diabetic retinopathy; NPDR non-proliferative diabetic retinopathy; PDR proliferative diabetic retinopathy; CSME clinically significant macular edema; DME diabetic macular edema; dbh dot blot hemorrhages; CWS cotton wool spot; POAG primary open angle glaucoma; C/D cup-to-disc ratio; HVF humphrey visual field; GVF goldmann visual field; OCT optical coherence tomography; IOP intraocular pressure; BRVO Branch retinal vein occlusion; CRVO central retinal vein occlusion; CRAO central retinal artery occlusion; BRAO branch retinal artery occlusion; RT retinal tear;  SB scleral buckle; PPV pars plana vitrectomy; VH Vitreous hemorrhage; PRP panretinal laser photocoagulation; IVK intravitreal kenalog; VMT vitreomacular traction; MH Macular hole;  NVD neovascularization of the disc; NVE neovascularization elsewhere; AREDS age related eye disease study; ARMD age related macular degeneration; POAG primary open angle glaucoma; EBMD epithelial/anterior basement membrane dystrophy; ACIOL anterior chamber intraocular lens; IOL intraocular lens; PCIOL posterior chamber intraocular lens; Phaco/IOL phacoemulsification with intraocular lens placement; [place] photorefractive keratectomy; LASIK laser assisted in situ keratomileusis; HTN hypertension; DM diabetes mellitus; COPD chronic obstructive pulmonary disease

## 2022-01-12 ENCOUNTER — Encounter (INDEPENDENT_AMBULATORY_CARE_PROVIDER_SITE_OTHER): Payer: Self-pay | Admitting: Ophthalmology

## 2022-01-12 ENCOUNTER — Ambulatory Visit (INDEPENDENT_AMBULATORY_CARE_PROVIDER_SITE_OTHER): Payer: No Typology Code available for payment source | Admitting: Ophthalmology

## 2022-01-12 DIAGNOSIS — H35033 Hypertensive retinopathy, bilateral: Secondary | ICD-10-CM

## 2022-01-12 DIAGNOSIS — H35371 Puckering of macula, right eye: Secondary | ICD-10-CM | POA: Diagnosis not present

## 2022-01-12 DIAGNOSIS — H40051 Ocular hypertension, right eye: Secondary | ICD-10-CM

## 2022-01-12 DIAGNOSIS — Z961 Presence of intraocular lens: Secondary | ICD-10-CM

## 2022-01-12 DIAGNOSIS — I10 Essential (primary) hypertension: Secondary | ICD-10-CM | POA: Diagnosis not present

## 2022-01-12 DIAGNOSIS — H4311 Vitreous hemorrhage, right eye: Secondary | ICD-10-CM | POA: Diagnosis not present

## 2022-01-12 DIAGNOSIS — E113412 Type 2 diabetes mellitus with severe nonproliferative diabetic retinopathy with macular edema, left eye: Secondary | ICD-10-CM

## 2022-01-12 DIAGNOSIS — E113511 Type 2 diabetes mellitus with proliferative diabetic retinopathy with macular edema, right eye: Secondary | ICD-10-CM

## 2022-01-12 DIAGNOSIS — E113513 Type 2 diabetes mellitus with proliferative diabetic retinopathy with macular edema, bilateral: Secondary | ICD-10-CM

## 2022-01-12 MED ORDER — BEVACIZUMAB CHEMO INJECTION 1.25MG/0.05ML SYRINGE FOR KALEIDOSCOPE
1.2500 mg | INTRAVITREAL | Status: AC | PRN
  Administered 2022-01-12: 1.25 mg via INTRAVITREAL

## 2022-02-23 NOTE — Progress Notes (Signed)
[facility] Note  02/28/2022    CHIEF COMPLAINT Patient presents for Retina Follow Up   HISTORY OF PRESENT ILLNESS: Matthew Sellers is a 81 y.o. male who presents to the clinic today for:   HPI     Retina Follow Up   Patient presents with  Diabetic Retinopathy.  In both eyes.  This started -2 weeks ago.  Duration of -2.  I, the attending physician,  performed the HPI with the patient and updated documentation appropriately.        Comments   4-6 week retina eval pt states no vision changes noticed       Last edited by Bernarda Caffey, MD on 03/01/2022  1:04 PM.      Referring physician: No referring provider defined for this encounter.  HISTORICAL INFORMATION:   Selected notes from the MEDICAL RECORD NUMBER [facility]:  Ocular Hx- PMH   CURRENT MEDICATIONS: Current Outpatient Medications (Ophthalmic Drugs)  Medication Sig   brimonidine (ALPHAGAN) 0.2 % ophthalmic solution Place 1 drop into the right eye 2 (two) times daily.   Brinzolamide-Brimonidine 1-0.2 % SUSP Place 1 drop into both eyes 2 (two) times daily.   carboxymethylcellulose (REFRESH PLUS) 0.5 % SOLN INSTILL 1 DROP IN BOTH EYES FIVE TIMES A DAY   latanoprost (XALATAN) 0.005 % ophthalmic solution Place 1 drop into both eyes at bedtime.   Latanoprost 0.005 % EMUL    timolol (TIMOPTIC-XR) 0.5 % ophthalmic gel-forming SMARTSIG:In Eye(s)   No current facility-administered medications for this visit. (Ophthalmic Drugs)   Current Outpatient Medications (Other)  Medication Sig   amLODipine (NORVASC) 10 MG tablet Take 10 mg by mouth daily.   aspirin EC 81 MG tablet Take 81 mg by mouth daily.   cefpodoxime (VANTIN) 200 MG tablet    fluticasone (FLONASE) 50 MCG/ACT nasal spray Place 2 sprays into both nostrils daily as needed for allergies or rhinitis.   gabapentin (NEURONTIN) 100 MG capsule Take 1 capsule by mouth 3 (three) times daily.   gabapentin  (NEURONTIN) 100 MG capsule Take 2 capsules by mouth at bedtime.   HYDROcodone-acetaminophen (NORCO/VICODIN) 5-325 MG tablet Take 1-2 tablets by mouth every 4 (four) hours as needed for moderate pain.   insulin glargine (LANTUS) 100 UNIT/ML injection Inject 30 Units into the skin at bedtime.    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   Krill Oil 300 MG CAPS Take 300 mg by mouth daily.   lidocaine (LIDODERM) 5 % Place 1 patch onto the skin daily. Remove & Discard patch within 12 hours or as directed by MD   lisinopril (ZESTRIL) 20 MG tablet Take 2 tablets by mouth daily.   lisinopril-hydrochlorothiazide (ZESTORETIC) 20-25 MG tablet Take 2 tablets by mouth every morning.   metFORMIN (GLUCOPHAGE) 1000 MG tablet Take 1,000 mg by mouth 2 (two) times daily with a meal.   nitroGLYCERIN (NITROSTAT) 0.4 MG SL tablet Place 0.4 mg under the tongue every 5 (five) minutes as needed for chest pain.   pravastatin (PRAVACHOL) 20 MG tablet Take 20 mg by mouth at bedtime.   PRECISION XTRA TEST STRIPS test strip    No current facility-administered medications for this visit. (Other)   REVIEW OF SYSTEMS: ROS   Positive for: Eyes Last edited by Bernarda Caffey, MD on 02/28/2022  2:12 PM.  ALLERGIES No Known Allergies  PAST MEDICAL HISTORY Past Medical History:  Diagnosis Date   Anemia    Bradycardia    CAD (coronary artery disease)    Chest tightness    Chronic kidney disease    Diabetes mellitus without complication (HCC)    Diabetic retinopathy ([place])    Dyslipidemia    Dyspnea on exertion    Glaucoma    Hypertension    Obesity    Paroxysmal atrial fibrillation ([place])    a. identified on device interrogation   Pneumonia    Past Surgical History:  Procedure Laterality Date   ACHILLES TENDON SURGERY     for rupture   CARDIAC CATHETERIZATION   07/08/2009   mild to mod . prox LAD 50% (Dr. Felton Clinton)   CATARACT EXTRACTION     CHOLECYSTECTOMY N/A 10/11/2017   Procedure: LAPAROSCOPIC CHOLECYSTECTOMY WITH INTRAOPERATIVE CHOLANGIOGRAM;  Surgeon: Alphonsa Overall, MD;  Location: [facility];  Service: General;  Laterality: N/A;   DOPPLER ECHOCARDIOGRAPHY  04/10/2008   LVEF 70-80% ,norm   EYE SURGERY     PACEMAKER IMPLANT N/A 10/12/2017   Procedure: PACEMAKER IMPLANT;  Surgeon: Evans Lance, MD;  Location: [facility];  Service: Cardiovascular;  Laterality: N/A;   stress myoview  04/09/2008   EF 54%; LV  norm   TEMPORARY PACEMAKER N/A 10/11/2017   Procedure: TEMPORARY PACEMAKER;  Surgeon: Evans Lance, MD;  Location: [facility];  Service: Cardiovascular;  Laterality: N/A;   FAMILY HISTORY Family History  Problem Relation Age of Onset   Leukemia Mother    SOCIAL HISTORY Social History   Tobacco Use   Smoking status: Never   Smokeless tobacco: Never  Vaping Use   Vaping Use: Never used  Substance Use Topics   Alcohol use: No    Alcohol/week: 0.0 standard drinks of alcohol   Drug use: No       OPHTHALMIC EXAM:  Base Eye Exam     Visual Acuity (Snellen - Linear)       Right Left   Dist cc 20/25 20/25 -1         Tonometry (Tonopen, 1:21 PM)       Right Left   Pressure 18 15         Pupils       Pupils Dark Light Shape React APD   Right PERRL 2 1 Round Brisk None   Left PERRL 2 1 Round Brisk None         Visual Fields       Left Right    Full Full         Extraocular Movement       Right Left    Full, Ortho Full, Ortho         Neuro/Psych     Oriented x3: Yes   Mood/Affect: Normal         Dilation     Both eyes: 2.5% Phenylephrine @ 1:21 PM           Slit Lamp and Fundus Exam     Slit Lamp Exam       Right Left   Lids/Lashes Dermatochalasis - upper lid, mild MGD Dermatochalasis - upper lid, mild MGD   Conjunctiva/Sclera nasal pingeucula, Melanosis nasal  pingeucula, Melanosis   Cornea 2-3+ Punctate epithelial erosions, well healed cataract wound 2-3+ Punctate epithelial erosions, well healed cataract wound   Anterior Chamber Deep and quiet Deep and quiet   Iris round and  poorly dilated to 34m, No NVI round and poorly dilated to 47m No NVI   Lens PC IOL in good position PC IOL in good position   Anterior Vitreous Mild Vitreous syneresis, Posterior vitreous detachment, blood stained vitreous condensations inferiorly -- clearing centrally and settling inferiorly Mild Vitreous syneresis, Posterior vitreous detachment, vitreous condensations, interval improvement in blood stained vitreous condensations         Fundus Exam       Right Left   Disc mild Pallor, Sharp rim, +cupping mild Pallor, Sharp rim, +cupping, focal PPP temporal, vascular loops, early NVD -- improved   C/D Ratio 0.6 0.5   Macula Flat, good foveal reflex, scattered Microaneurysms, trace cystic changes - improved, RPE mottling and clumping Flat, good foveal reflex, rare MA/DBH, mild cystic changes temporal mac - improved   Vessels attenuated, mild tortuosity attenuated, Tortuous, no obvious NV   Periphery Attached, +NV along inferior arcades and nasal midzone - improved, 360 MA/DBH, pre-retinal hemes settled inferiorly - improved, good 360 PRP changes -- room for temporal fill in, scattered fibrosis Attached, 360 MA/DBH, focal exudates nasal midzone, no PRP laser           Refraction     Wearing Rx       Sphere Cylinder Axis Add   Right -0.75 +1.25 004 +2.75   Left -0.75 +1.25 004 +2.75    Type: PAL           IMAGING AND PROCEDURES  Imaging and Procedures for 02/28/2022  OCT, Retina - OU - Both Eyes       Right Eye Quality was good. Central Foveal Thickness: 287. Progression has improved. Findings include no IRF, no SRF, abnormal foveal contour, epiretinal membrane, preretinal fibrosis (Stable improvement in vitreous opacities -- persistent inferiorly,  pre-retinal fibrosis inferiorly -- stable, mild interval improvement in cystic changes temporal fovea and mac).   Left Eye Quality was borderline. Central Foveal Thickness: 261. Progression has improved. Findings include normal foveal contour, no SRF, intraretinal hyper-reflective material, intraretinal fluid (Mild interval improvement in IRF temporal macula, stable improvement in vitreous opacities).   Notes *Images captured and stored on drive  Diagnosis / Impression:  OD: Stable improvement in vitreous opacities -- persistent inferiorly, pre-retinal fibrosis inferiorly -- stable, mild interval improvement in cystic changes temporal fovea and mac OS: Mild interval improvement in IRF temporal macula, stable improvement in vitreous opacities  Clinical management:  See below  Abbreviations: NFP - Normal foveal profile. CME - cystoid macular edema. PED - pigment epithelial detachment. IRF - intraretinal fluid. SRF - subretinal fluid. EZ - ellipsoid zone. ERM - epiretinal membrane. ORA - outer retinal atrophy. ORT - outer retinal tubulation. SRHM - subretinal hyper-reflective material. IRHM - intraretinal hyper-reflective material      Intravitreal Injection, Pharmacologic Agent - OD - Right Eye       Time Out 02/28/2022. 1:54 PM. Confirmed correct patient, procedure, site, and patient consented.   Anesthesia Topical anesthesia was used. Anesthetic medications included Lidocaine 2%, Proparacaine 0.5%.   Procedure Preparation included 5% betadine to ocular surface, eyelid speculum. A supplied needle was used.   Injection: 1.25 mg Bevacizumab 1.2563m.05ml   Route: Intravitreal, Site: Right Eye   NDC: 50242-060-01, Lot: 3: [id number] date: 03/28/2022   Post-op Post injection exam found visual acuity of at least counting fingers. The patient tolerated the procedure well. There were no complications. The patient received written and verbal post procedure care education. Post  injection medications were not given.  Intravitreal Injection, Pharmacologic Agent - OS - Left Eye       Time Out 02/28/2022. 1:55 PM. Confirmed correct patient, procedure, site, and patient consented.   Anesthesia Topical anesthesia was used. Anesthetic medications included Lidocaine 2%, Proparacaine 0.5%.   Procedure Preparation included 5% betadine to ocular surface, eyelid speculum. A (32g) needle was used.   Injection: 1.25 mg Bevacizumab 1.6m/0.05ml   Route: Intravitreal, Site: Left Eye   NDC:: 31438-887-57 Lot:: [id number] Expiration date: 03/20/2022   Post-op Post injection exam found visual acuity of at least counting fingers. The patient tolerated the procedure well. There were no complications. The patient received written and verbal post procedure care education.            ASSESSMENT/PLAN:    ICD-10-CM   1. Proliferative diabetic retinopathy of both eyes with macular edema associated with type 2 diabetes mellitus (HCC)  E11.3513 OCT, Retina - OU - Both Eyes    Intravitreal Injection, Pharmacologic Agent - OD - Right Eye    Intravitreal Injection, Pharmacologic Agent - OS - Left Eye    Bevacizumab (AVASTIN) SOLN 1.25 mg    Bevacizumab (AVASTIN) SOLN 1.25 mg    2. Vitreous hemorrhage, right eye (HCC)  H43.11     3. Epiretinal membrane (ERM) of right eye  H35.371     4. Essential hypertension  I10     5. Hypertensive retinopathy of both eyes  H35.033     6. Pseudophakia, both eyes  Z96.1     7. Ocular hypertension of right eye  H40.051      1,2. Proliferative diabetic retinopathy OU w/ vitreous hemorrhage, OD   - s/p IVA OD #1 (12.19.22), #2 (01.16.23), #3 (02.16.23), #4 (04.04.23), #5 (09.13.23), #6 (10.11.23)  - s/p IVA OS #1 (09.13.23), #2 (10.11.23)  - s/p PRP OD (02.27.23), (09.19.23)  [facility] by Dr. GAnderson [place]- BCVA OD: 20/25 (stable) ; 20/25 OS (decreased) - FA (09.05.23) OD: Interval improvement in NVE (inf  arcades and nasal midzone); vascular nonperfusion and perivascular leakage temp periphery and macula, room for PRP fill in temp periphery; OS: Mild NVD, vascular non-perfusion and perivascular leakage temporal periphery **pt would benefit from PRP OS** - OCT today shows OD: Stable improvement in vitreous opacities -- persistent inferiorly, pre-retinal fibrosis inferiorly -- stable, mild interval improvement in cystic changes temporal fovea and mac; OS: Mild interval improvement in IRF temporal macula, stable improvement in vitreous opacities at 6.5 weeks - recommend IVA OU (OD #5 and OS #3) today, 11.27.23 with follow up in 7 weeks - pt wishes to proceed with injection - RBA of procedure discussed, questions answered - IVA informed consent obtained and signed, 09.13.23 (OS) - see procedure note  - VH precautions reviewed -- minimize activities, keep head elevated, avoid ASA/NSAIDs/blood thinners as able  - f/u 7 weeks, DFE, OCT, possible injection(s)  3. Epiretinal membrane, right eye  - mild ERM OD - BCVA 20/25 - asymptomatic, no metamorphopsia - no indication for surgery at this time - continue to monitor  4,5. Hypertensive retinopathy OU - discussed importance of tight BP control - continue to monitor  6. Pseudophakia OU  - s/p CE/IOL OU  - IOLs in good position, doing well  - continue to monitor  7. Ocular Hypertension OD  - 18 OD on Brim BID OD  - continue brimonidine bid OD  Ophthalmic Meds Ordered this visit:  Meds ordered this encounter  Medications   Bevacizumab (AVASTIN) SOLN 1.25  mg   Bevacizumab (AVASTIN) SOLN 1.25 mg     Return in about 7 weeks (around 04/18/2022) for f/u PDR OU, DFE, OCT, Possible Injxn.  There are no Patient Instructions on file for this visit.   Explained the diagnoses, plan, and follow up with the patient and they expressed understanding.  Patient expressed understanding of the importance of proper follow up care.   This document serves as  a record of services personally performed by Gardiner Sleeper, MD, PhD. It was created on their behalf by Roselee Nova, COMT. The creation of this record is the provider's dictation and/or activities during the visit.  Electronically signed by: Roselee Nova, COMT 03/01/22 1:04 PM  This document serves as a record of services personally performed by Gardiner Sleeper, MD, PhD. It was created on their behalf by San Jetty. Owens Shark, OA an ophthalmic technician. The creation of this record is the provider's dictation and/or activities during the visit.    Electronically signed by: San Jetty. Owens Shark, [place] 11.27.2023 1:04 PM  Gardiner Sleeper, M.D., Ph.D. Diseases & Surgery of the Retina and Vitreous [facility]  I have reviewed the above documentation for accuracy and completeness, and I agree with the above. Gardiner Sleeper, M.D., Ph.D. 03/01/22 1:05 PM   Abbreviations: M myopia (nearsighted); A astigmatism; H hyperopia (farsighted); P presbyopia; Mrx spectacle prescription;  CTL contact lenses; OD right eye; OS left eye; OU both eyes  XT exotropia; ET esotropia; PEK punctate epithelial keratitis; PEE punctate epithelial erosions; DES dry eye syndrome; MGD meibomian gland dysfunction; ATs artificial tears; PFAT's preservative free artificial tears; [place] nuclear sclerotic cataract; PSC posterior subcapsular cataract; ERM epi-retinal membrane; PVD posterior vitreous detachment; RD retinal detachment; DM diabetes mellitus; DR diabetic retinopathy; NPDR non-proliferative diabetic retinopathy; PDR proliferative diabetic retinopathy; CSME clinically significant macular edema; DME diabetic macular edema; dbh dot blot hemorrhages; CWS cotton wool spot; POAG primary open angle glaucoma; C/D cup-to-disc ratio; HVF humphrey visual field; GVF goldmann visual field; OCT optical coherence tomography; IOP intraocular pressure; BRVO Branch retinal vein occlusion; CRVO central retinal vein occlusion; CRAO  central retinal artery occlusion; BRAO branch retinal artery occlusion; RT retinal tear; SB scleral buckle; PPV pars plana vitrectomy; VH Vitreous hemorrhage; PRP panretinal laser photocoagulation; IVK intravitreal kenalog; VMT vitreomacular traction; MH Macular hole;  NVD neovascularization of the disc; NVE neovascularization elsewhere; AREDS age related eye disease study; ARMD age related macular degeneration; POAG primary open angle glaucoma; EBMD epithelial/anterior basement membrane dystrophy; ACIOL anterior chamber intraocular lens; IOL intraocular lens; PCIOL posterior chamber intraocular lens; Phaco/IOL phacoemulsification with intraocular lens placement; [place] photorefractive keratectomy; LASIK laser assisted in situ keratomileusis; HTN hypertension; DM diabetes mellitus; COPD chronic obstructive pulmonary disease

## 2022-02-28 ENCOUNTER — Ambulatory Visit (INDEPENDENT_AMBULATORY_CARE_PROVIDER_SITE_OTHER): Payer: No Typology Code available for payment source | Admitting: Ophthalmology

## 2022-02-28 ENCOUNTER — Encounter (INDEPENDENT_AMBULATORY_CARE_PROVIDER_SITE_OTHER): Payer: Self-pay | Admitting: Ophthalmology

## 2022-02-28 DIAGNOSIS — H4311 Vitreous hemorrhage, right eye: Secondary | ICD-10-CM | POA: Diagnosis not present

## 2022-02-28 DIAGNOSIS — H35371 Puckering of macula, right eye: Secondary | ICD-10-CM

## 2022-02-28 DIAGNOSIS — I10 Essential (primary) hypertension: Secondary | ICD-10-CM | POA: Diagnosis not present

## 2022-02-28 DIAGNOSIS — H40051 Ocular hypertension, right eye: Secondary | ICD-10-CM

## 2022-02-28 DIAGNOSIS — H35033 Hypertensive retinopathy, bilateral: Secondary | ICD-10-CM

## 2022-02-28 DIAGNOSIS — Z961 Presence of intraocular lens: Secondary | ICD-10-CM

## 2022-02-28 DIAGNOSIS — E113513 Type 2 diabetes mellitus with proliferative diabetic retinopathy with macular edema, bilateral: Secondary | ICD-10-CM

## 2022-02-28 MED ORDER — BEVACIZUMAB CHEMO INJECTION 1.25MG/0.05ML SYRINGE FOR KALEIDOSCOPE
1.2500 mg | INTRAVITREAL | Status: AC | PRN
  Administered 2022-02-28: 1.25 mg via INTRAVITREAL

## 2022-04-18 ENCOUNTER — Encounter (INDEPENDENT_AMBULATORY_CARE_PROVIDER_SITE_OTHER): Payer: Medicare Other | Admitting: Ophthalmology

## 2022-04-26 NOTE — Progress Notes (Signed)
[facility] Note  05/02/2022    CHIEF COMPLAINT Patient presents for Retina Follow Up   HISTORY OF PRESENT ILLNESS: Matthew Sellers is a 82 y.o. male who presents to the clinic today for:   HPI     Retina Follow Up   Patient presents with  Diabetic Retinopathy.  In both eyes.  Severity is moderate.  Duration of 9 weeks.  Since onset it is stable.  I, the attending physician,  performed the HPI with the patient and updated documentation appropriately.        Comments   Pt here for 9 wk ret f/u for PDR OU. Pt states [facility] the same, no changes noted.       Last edited by Bernarda Caffey, MD on 05/02/2022  5:34 PM.     Delayed follow up 9 wks instead of 7 weeks  Referring physician: No referring provider defined for this encounter.  HISTORICAL INFORMATION:   Selected notes from the MEDICAL RECORD NUMBER [facility] exam LEE:  Ocular Hx- PMH   CURRENT MEDICATIONS: Current Outpatient Medications (Ophthalmic Drugs)  Medication Sig   brimonidine (ALPHAGAN) 0.2 % ophthalmic solution Place 1 drop into the right eye 2 (two) times daily.   Brinzolamide-Brimonidine 1-0.2 % SUSP Place 1 drop into both eyes 2 (two) times daily.   carboxymethylcellulose (REFRESH PLUS) 0.5 % SOLN INSTILL 1 DROP IN BOTH EYES FIVE TIMES A DAY   latanoprost (XALATAN) 0.005 % ophthalmic solution Place 1 drop into both eyes at bedtime.   Latanoprost 0.005 % EMUL    timolol (TIMOPTIC-XR) 0.5 % ophthalmic gel-forming SMARTSIG:In Eye(s)   No current facility-administered medications for this visit. (Ophthalmic Drugs)   Current Outpatient Medications (Other)  Medication Sig   amLODipine (NORVASC) 10 MG tablet Take 10 mg by mouth daily.   aspirin EC 81 MG tablet Take 81 mg by mouth daily.   cefpodoxime (VANTIN) 200 MG tablet    gabapentin (NEURONTIN) 100 MG capsule Take 2 capsules by mouth at bedtime.   insulin glargine (LANTUS) 100 UNIT/ML injection Inject 30 Units  into the skin at bedtime.    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   Krill Oil 300 MG CAPS Take 300 mg by mouth daily.   lidocaine (LIDODERM) 5 % Place 1 patch onto the skin daily. Remove & Discard patch within 12 hours or as directed by MD   lisinopril (ZESTRIL) 20 MG tablet Take 2 tablets by mouth daily.   lisinopril-hydrochlorothiazide (ZESTORETIC) 20-25 MG tablet Take 2 tablets by mouth every morning.   metFORMIN (GLUCOPHAGE) 1000 MG tablet Take 1,000 mg by mouth 2 (two) times daily with a meal.   pravastatin (PRAVACHOL) 20 MG tablet Take 20 mg by mouth at bedtime.   PRECISION XTRA TEST STRIPS test strip    fluticasone (FLONASE) 50 MCG/ACT nasal spray Place 2 sprays into both nostrils daily as needed for allergies or rhinitis.   gabapentin (NEURONTIN) 100 MG capsule Take 1 capsule by mouth 3 (three) times daily.   HYDROcodone-acetaminophen (NORCO/VICODIN) 5-325 MG tablet Take 1-2 tablets by mouth every 4 (four) hours as needed for moderate pain.   nitroGLYCERIN (NITROSTAT) 0.4 MG SL tablet Place 0.4 mg under the tongue every 5 (five) minutes as needed for chest pain.   No current facility-administered medications for this visit. (Other)  REVIEW OF SYSTEMS: ROS   Positive for: Genitourinary, Endocrine, Cardiovascular, Eyes Negative for: Constitutional, Gastrointestinal, Neurological, Skin, Musculoskeletal, HENT, Respiratory, Psychiatric, Allergic/Imm, Heme/Lymph Last edited by Kingsley Spittle, COT on 05/02/2022  9:29 AM.     ALLERGIES No Known Allergies  PAST MEDICAL HISTORY Past Medical History:  Diagnosis Date   Anemia    Bradycardia    CAD (coronary artery disease)    Chest tightness    Chronic kidney disease    Diabetes mellitus without complication (HCC)    Diabetic retinopathy ([place])    Dyslipidemia    Dyspnea on exertion     Glaucoma    Hypertension    Obesity    Paroxysmal atrial fibrillation ([place])    a. identified on device interrogation   Pneumonia    Past Surgical History:  Procedure Laterality Date   ACHILLES TENDON SURGERY     for rupture   CARDIAC CATHETERIZATION  07/08/2009   mild to mod . prox LAD 50% (Dr. Felton Clinton)   CATARACT EXTRACTION     CHOLECYSTECTOMY N/A 10/11/2017   Procedure: LAPAROSCOPIC CHOLECYSTECTOMY WITH INTRAOPERATIVE CHOLANGIOGRAM;  Surgeon: Alphonsa Overall, MD;  Location: [facility];  Service: General;  Laterality: N/A;   DOPPLER ECHOCARDIOGRAPHY  04/10/2008   LVEF 70-80% ,norm   EYE SURGERY     PACEMAKER IMPLANT N/A 10/12/2017   Procedure: PACEMAKER IMPLANT;  Surgeon: Evans Lance, MD;  Location: [facility];  Service: Cardiovascular;  Laterality: N/A;   stress myoview  04/09/2008   EF 54%; LV  norm   TEMPORARY PACEMAKER N/A 10/11/2017   Procedure: TEMPORARY PACEMAKER;  Surgeon: Evans Lance, MD;  Location: [facility];  Service: Cardiovascular;  Laterality: N/A;   FAMILY HISTORY Family History  Problem Relation Age of Onset   Leukemia Mother    SOCIAL HISTORY Social History   Tobacco Use   Smoking status: Never   Smokeless tobacco: Never  Vaping Use   Vaping Use: Never used  Substance Use Topics   Alcohol use: No    Alcohol/week: 0.0 standard drinks of alcohol   Drug use: No       OPHTHALMIC EXAM:  Base Eye Exam     Visual Acuity (Snellen - Linear)       Right Left   Dist cc 20/25 +1 20/25    Correction: Glasses         Tonometry (Tonopen, 9:32 AM)       Right Left   Pressure 20 12         Pupils       Pupils Dark Light Shape React APD   Right PERRL 2 1 Round Minimal None   Left PERRL 2 1 Round Minimal None         Visual Fields (Counting fingers)       Left Right    Full Full         Extraocular Movement       Right Left    Full, Ortho Full, Ortho         Neuro/Psych     Oriented x3: Yes    Mood/Affect: Normal         Dilation     Both eyes: 1.0% Mydriacyl, 2.5% Phenylephrine @ 9:33 AM           Slit Lamp and Fundus Exam     Slit Lamp Exam       Right Left   Lids/Lashes Dermatochalasis - upper lid, mild MGD  Dermatochalasis - upper lid, mild MGD   Conjunctiva/Sclera nasal pingeucula, Melanosis nasal pingeucula, Melanosis   Cornea 2-3+ Punctate epithelial erosions, well healed cataract wound 2-3+ Punctate epithelial erosions, well healed cataract wound   Anterior Chamber Deep and quiet Deep and quiet   Iris round and poorly dilated to 50m, No NVI round and poorly dilated to 420m No NVI   Lens PC IOL in good position PC IOL in good position   Anterior Vitreous Mild Vitreous syneresis, Posterior vitreous detachment, blood stained vitreous condensations inferiorly -- clearing centrally and settling inferiorly Mild Vitreous syneresis, Posterior vitreous detachment, vitreous condensations, interval improvement in blood stained vitreous condensations         Fundus Exam       Right Left   Disc mild Pallor, Sharp rim, +cupping mild Pallor, Sharp rim, +cupping, focal PPP temporal, vascular loops, early NVD -- stably improved   C/D Ratio 0.6 0.5   Macula Flat, good foveal reflex, scattered Microaneurysms, trace cystic changes - improved, RPE mottling and clumping Flat, good foveal reflex, rare MA/DBH, mild cystic changes temporal mac - slightly increased   Vessels attenuated, mild tortuosity attenuated, Tortuous, no obvious NV   Periphery Attached, +NV along inferior arcades and nasal midzone - improved, 360 MA/DBH, pre-retinal hemes settled inferiorly - improved, good 360 PRP changes -- room for temporal fill in, scattered fibrosis Attached, 360 MA/DBH, focal exudates nasal midzone, no PRP laser           Refraction     Wearing Rx       Sphere Cylinder Axis Add   Right -0.75 +1.25 004 +2.75   Left -0.75 +1.25 004 +2.75    Type: PAL           IMAGING AND  PROCEDURES  Imaging and Procedures for 05/02/2022  OCT, Retina - OU - Both Eyes       Right Eye Quality was good. Central Foveal Thickness: 287. Progression has improved. Findings include no IRF, no SRF, abnormal foveal contour, epiretinal membrane, preretinal fibrosis (Stable improvement in vitreous opacities -- persistent inferiorly, pre-retinal fibrosis inferiorly -- stable, stable improvement in cystic changes temporal fovea and mac).   Left Eye Quality was borderline. Central Foveal Thickness: 260. Progression has worsened. Findings include normal foveal contour, no SRF, intraretinal hyper-reflective material, intraretinal fluid (Mild interval increase in IRF temporal macula, stable improvement in vitreous opacities).   Notes *Images captured and stored on drive  Diagnosis / Impression:  OD: Stable improvement in vitreous opacities -- persistent inferiorly, pre-retinal fibrosis inferiorly -- stable, stable improvement in cystic changes temporal fovea and mac OS: Mild interval increase in IRF temporal macula, stable improvement in vitreous opacities  Clinical management:  See below  Abbreviations: NFP - Normal foveal profile. CME - cystoid macular edema. PED - pigment epithelial detachment. IRF - intraretinal fluid. SRF - subretinal fluid. EZ - ellipsoid zone. ERM - epiretinal membrane. ORA - outer retinal atrophy. ORT - outer retinal tubulation. SRHM - subretinal hyper-reflective material. IRHM - intraretinal hyper-reflective material      Intravitreal Injection, Pharmacologic Agent - OD - Right Eye       Time Out 05/02/2022. 11:11 AM. Confirmed correct patient, procedure, site, and patient consented.   Anesthesia Topical anesthesia was used. Anesthetic medications included Lidocaine 2%, Proparacaine 0.5%.   Procedure Preparation included 5% betadine to ocular surface, eyelid speculum. A (32g) needle was used.   Injection: 1.25 mg Bevacizumab 1.2563m.05ml   Route:  Intravitreal, Site: Right Eye  [place]: [id number], Lot: [id number], Expiration date: 06/03/2022   Post-op Post injection exam found visual acuity of at least counting fingers. The patient tolerated the procedure well. There were no complications. The patient received written and verbal post procedure care education. Post injection medications were not given.      Intravitreal Injection, Pharmacologic Agent - OS - Left Eye       Time Out 05/02/2022. 11:11 AM. Confirmed correct patient, procedure, site, and patient consented.   Anesthesia Topical anesthesia was used. Anesthetic medications included Lidocaine 2%, Proparacaine 0.5%.   Procedure Preparation included 5% betadine to ocular surface, eyelid speculum. A supplied (32g) needle was used.   Injection: 1.25 mg Bevacizumab 1.15m/0.05ml   Route: Intravitreal, Site: Left Eye   NDC:: 62836-629-47 Lot:: [id number] Expiration date: 06/21/2022   Post-op Post injection exam found visual acuity of at least counting fingers. The patient tolerated the procedure well. There were no complications. The patient received written and verbal post procedure care education.            ASSESSMENT/PLAN:    ICD-10-CM   1. Proliferative diabetic retinopathy of both eyes with macular edema associated with type 2 diabetes mellitus (HCC)  E11.3513 OCT, Retina - OU - Both Eyes    Intravitreal Injection, Pharmacologic Agent - OD - Right Eye    Intravitreal Injection, Pharmacologic Agent - OS - Left Eye    Bevacizumab (AVASTIN) SOLN 1.25 mg    Bevacizumab (AVASTIN) SOLN 1.25 mg    2. Vitreous hemorrhage, right eye (HCC)  H43.11     3. Epiretinal membrane (ERM) of right eye  H35.371     4. Essential hypertension  I10     5. Hypertensive retinopathy of both eyes  H35.033     6. Pseudophakia, both eyes  Z96.1     7. Ocular hypertension of right eye  H40.051      1,2. Proliferative diabetic retinopathy OU w/ vitreous hemorrhage, OD   - s/p IVA OD #1  (12.19.22), #2 (01.16.23), #3 (02.16.23), #4 (04.04.23), #5 (09.13.23), #6 (10.11.23), #7 (11.27.23)  - s/p IVA OS #1 (09.13.23), #2 (10.11.23), #3 (11.27.23)  - s/p PRP OD (02.27.23), (09.19.23)  [facility] by Dr. G[facility] [place]- BCVA OD: 20/25 (stable) ; 20/25 OS (stable) - FA (09.05.23) OD: Interval improvement in NVE (inf arcades and nasal midzone); vascular nonperfusion and perivascular leakage temp periphery and macula, room for PRP fill in temp periphery; OS: Mild NVD, vascular non-perfusion and perivascular leakage temporal periphery **pt would benefit from PRP OS** - OCT today shows OD: Stable improvement in vitreous opacities -- persistent inferiorly, pre-retinal fibrosis inferiorly -- stable, stable improvement in cystic changes temporal fovea and mac; OS: Mild interval increase in IRF temporal macula, stable improvement in vitreous opacities at 9 weeks - recommend IVA OU (OD #8 and OS #4) today, 01.29.24 with follow up in 8 weeks - pt wishes to proceed with injection - RBA of procedure discussed, questions answered - IVA informed consent obtained and signed, 09.13.23 (OS) - see procedure note  - VH precautions reviewed -- minimize activities, keep head elevated, avoid ASA/NSAIDs/blood thinners as able  - f/u 8 weeks, DFE, OCT, possible injection(s)  3. Epiretinal membrane, right eye  - mild ERM OD - BCVA 20/25 - asymptomatic, no metamorphopsia - no indication for surgery at this time - continue to monitor  4,5. Hypertensive retinopathy OU - discussed importance of tight BP control - continue to monitor  6. Pseudophakia OU  -  s/p CE/IOL OU  - IOLs in good position, doing well  - continue to monitor  7. Ocular Hypertension OD  - 20 OD on Brim BID OD  - continue brimonidine bid OD  Ophthalmic Meds Ordered this visit:  Meds ordered this encounter  Medications   Bevacizumab (AVASTIN) SOLN 1.25 mg   Bevacizumab (AVASTIN) SOLN 1.25 mg     Return in  about 8 weeks (around 06/27/2022) for f/u PDR OU, DFE, OCT.  There are no Patient Instructions on file for this visit.   Explained the diagnoses, plan, and follow up with the patient and they expressed understanding.  Patient expressed understanding of the importance of proper follow up care.   This document serves as a record of services personally performed by Gardiner Sleeper, MD, PhD. It was created on their behalf by San Jetty. Owens Shark, OA an ophthalmic technician. The creation of this record is the provider's dictation and/or activities during the visit.    Electronically signed by: San Jetty. Owens Shark, [place] 01.23.2024 11:54 PM  Gardiner Sleeper, M.D., Ph.D. Diseases & Surgery of the Retina and Vitreous [facility]  I have reviewed the above documentation for accuracy and completeness, and I agree with the above. Gardiner Sleeper, M.D., Ph.D. 05/02/22 11:54 PM  Abbreviations: M myopia (nearsighted); A astigmatism; H hyperopia (farsighted); P presbyopia; Mrx spectacle prescription;  CTL contact lenses; OD right eye; OS left eye; OU both eyes  XT exotropia; ET esotropia; PEK punctate epithelial keratitis; PEE punctate epithelial erosions; DES dry eye syndrome; MGD meibomian gland dysfunction; ATs artificial tears; PFAT's preservative free artificial tears; [place] nuclear sclerotic cataract; PSC posterior subcapsular cataract; ERM epi-retinal membrane; PVD posterior vitreous detachment; RD retinal detachment; DM diabetes mellitus; DR diabetic retinopathy; NPDR non-proliferative diabetic retinopathy; PDR proliferative diabetic retinopathy; CSME clinically significant macular edema; DME diabetic macular edema; dbh dot blot hemorrhages; CWS cotton wool spot; POAG primary open angle glaucoma; C/D cup-to-disc ratio; HVF humphrey visual field; GVF goldmann visual field; OCT optical coherence tomography; IOP intraocular pressure; BRVO Branch retinal vein occlusion; CRVO central retinal vein  occlusion; CRAO central retinal artery occlusion; BRAO branch retinal artery occlusion; RT retinal tear; SB scleral buckle; PPV pars plana vitrectomy; VH Vitreous hemorrhage; PRP panretinal laser photocoagulation; IVK intravitreal kenalog; VMT vitreomacular traction; MH Macular hole;  NVD neovascularization of the disc; NVE neovascularization elsewhere; AREDS age related eye disease study; ARMD age related macular degeneration; POAG primary open angle glaucoma; EBMD epithelial/anterior basement membrane dystrophy; ACIOL anterior chamber intraocular lens; IOL intraocular lens; PCIOL posterior chamber intraocular lens; Phaco/IOL phacoemulsification with intraocular lens placement; [place] photorefractive keratectomy; LASIK laser assisted in situ keratomileusis; HTN hypertension; DM diabetes mellitus; COPD chronic obstructive pulmonary disease

## 2022-05-02 ENCOUNTER — Encounter (INDEPENDENT_AMBULATORY_CARE_PROVIDER_SITE_OTHER): Payer: Self-pay | Admitting: Ophthalmology

## 2022-05-02 ENCOUNTER — Ambulatory Visit (INDEPENDENT_AMBULATORY_CARE_PROVIDER_SITE_OTHER): Payer: No Typology Code available for payment source | Admitting: Ophthalmology

## 2022-05-02 DIAGNOSIS — I10 Essential (primary) hypertension: Secondary | ICD-10-CM | POA: Diagnosis not present

## 2022-05-02 DIAGNOSIS — E113513 Type 2 diabetes mellitus with proliferative diabetic retinopathy with macular edema, bilateral: Secondary | ICD-10-CM

## 2022-05-02 DIAGNOSIS — H4311 Vitreous hemorrhage, right eye: Secondary | ICD-10-CM | POA: Diagnosis not present

## 2022-05-02 DIAGNOSIS — H35371 Puckering of macula, right eye: Secondary | ICD-10-CM

## 2022-05-02 DIAGNOSIS — H35033 Hypertensive retinopathy, bilateral: Secondary | ICD-10-CM | POA: Diagnosis not present

## 2022-05-02 DIAGNOSIS — H40051 Ocular hypertension, right eye: Secondary | ICD-10-CM

## 2022-05-02 DIAGNOSIS — Z961 Presence of intraocular lens: Secondary | ICD-10-CM

## 2022-05-02 MED ORDER — BEVACIZUMAB CHEMO INJECTION 1.25MG/0.05ML SYRINGE FOR KALEIDOSCOPE
1.2500 mg | INTRAVITREAL | Status: AC | PRN
  Administered 2022-05-02: 1.25 mg via INTRAVITREAL

## 2022-06-23 NOTE — Progress Notes (Signed)
[facility] Note  06/27/2022    CHIEF COMPLAINT Patient presents for Retina Follow Up   HISTORY OF PRESENT ILLNESS: Matthew Sellers is a 82 y.o. male who presents to the clinic today for:   HPI     Retina Follow Up   Patient presents with  Diabetic Retinopathy.  In both eyes.  This started years ago.  Severity is moderate.  Duration of 2 months.  Since onset it is stable.  I, the attending physician,  performed the HPI with the patient and updated documentation appropriately.        Comments   Patient feels that the vision is about the same. He is using Brimonidine OD BID, Simbrinza OU BID, Lantanoprost OU QHS, Timoptic OU QAM. His blood sugar was 157.       Last edited by Bernarda Caffey, MD on 06/27/2022 12:17 PM.       Referring physician: No referring provider defined for this encounter.  HISTORICAL INFORMATION:   Selected notes from the MEDICAL RECORD NUMBER [facility]EE:  Ocular Hx- PMH   CURRENT MEDICATIONS: Current Outpatient Medications (Ophthalmic Drugs)  Medication Sig   brimonidine (ALPHAGAN) 0.2 % ophthalmic solution Place 1 drop into the right eye 2 (two) times daily.   Brinzolamide-Brimonidine 1-0.2 % SUSP Place 1 drop into both eyes 2 (two) times daily.   carboxymethylcellulose (REFRESH PLUS) 0.5 % SOLN INSTILL 1 DROP IN BOTH EYES FIVE TIMES A DAY   latanoprost (XALATAN) 0.005 % ophthalmic solution Place 1 drop into both eyes at bedtime.   Latanoprost 0.005 % EMUL    timolol (TIMOPTIC-XR) 0.5 % ophthalmic gel-forming SMARTSIG:In Eye(s)   No current facility-administered medications for this visit. (Ophthalmic Drugs)   Current Outpatient Medications (Other)  Medication Sig   amLODipine (NORVASC) 10 MG tablet Take 10 mg by mouth daily.   aspirin EC 81 MG tablet Take 81 mg by mouth daily.   cefpodoxime (VANTIN) 200 MG tablet    fluticasone (FLONASE) 50 MCG/ACT nasal spray Place 2 sprays into both  nostrils daily as needed for allergies or rhinitis.   gabapentin (NEURONTIN) 100 MG capsule Take 1 capsule by mouth 3 (three) times daily.   gabapentin (NEURONTIN) 100 MG capsule Take 2 capsules by mouth at bedtime.   HYDROcodone-acetaminophen (NORCO/VICODIN) 5-325 MG tablet Take 1-2 tablets by mouth every 4 (four) hours as needed for moderate pain.   insulin glargine (LANTUS) 100 UNIT/ML injection Inject 30 Units into the skin at bedtime.    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   Krill Oil 300 MG CAPS Take 300 mg by mouth daily.   lidocaine (LIDODERM) 5 % Place 1 patch onto the skin daily. Remove & Discard patch within 12 hours or as directed by MD   lisinopril (ZESTRIL) 20 MG tablet Take 2 tablets by mouth daily.   lisinopril-hydrochlorothiazide (ZESTORETIC) 20-25 MG tablet Take 2 tablets by mouth every morning.   metFORMIN (GLUCOPHAGE) 1000 MG tablet Take 1,000 mg by mouth 2 (two) times daily with a meal.   nitroGLYCERIN (NITROSTAT) 0.4 MG SL tablet Place 0.4 mg under the tongue every 5 (five) minutes as needed for chest pain.   pravastatin (PRAVACHOL) 20 MG tablet Take 20 mg by mouth at bedtime.   PRECISION XTRA TEST STRIPS test strip    No  current facility-administered medications for this visit. (Other)   REVIEW OF SYSTEMS: ROS   Positive for: Genitourinary, Endocrine, Cardiovascular, Eyes Negative for: Constitutional, Gastrointestinal, Neurological, Skin, Musculoskeletal, HENT, Respiratory, Psychiatric, Allergic/Imm, Heme/Lymph Last edited by Annie Paras, COT on 06/27/2022  9:26 AM.      ALLERGIES No Known Allergies  PAST MEDICAL HISTORY Past Medical History:  Diagnosis Date   Anemia    Bradycardia    CAD (coronary artery disease)    Chest tightness    Chronic kidney disease    Diabetes mellitus without complication (HCC)     Diabetic retinopathy ([place])    Dyslipidemia    Dyspnea on exertion    Glaucoma    Hypertension    Obesity    Paroxysmal atrial fibrillation ([place])    a. identified on device interrogation   Pneumonia    Past Surgical History:  Procedure Laterality Date   ACHILLES TENDON SURGERY     for rupture   CARDIAC CATHETERIZATION  07/08/2009   mild to mod . prox LAD 50% (Dr. Felton Clinton)   CATARACT EXTRACTION     CHOLECYSTECTOMY N/A 10/11/2017   Procedure: LAPAROSCOPIC CHOLECYSTECTOMY WITH INTRAOPERATIVE CHOLANGIOGRAM;  Surgeon: Alphonsa Overall, MD;  Location: [place];  Service: General;  Laterality: N/A;   DOPPLER ECHOCARDIOGRAPHY  04/10/2008   LVEF 70-80% ,norm   EYE SURGERY     PACEMAKER IMPLANT N/A 10/12/2017   Procedure: PACEMAKER IMPLANT;  Surgeon: Evans Lance, MD;  Location: [facility];  Service: Cardiovascular;  Laterality: N/A;   stress myoview  04/09/2008   EF 54%; LV  norm   TEMPORARY PACEMAKER N/A 10/11/2017   Procedure: TEMPORARY PACEMAKER;  Surgeon: Evans Lance, MD;  Location: [facility];  Service: Cardiovascular;  Laterality: N/A;   FAMILY HISTORY Family History  Problem Relation Age of Onset   Leukemia Mother    SOCIAL HISTORY Social History   Tobacco Use   Smoking status: Never   Smokeless tobacco: Never  Vaping Use   Vaping Use: Never used  Substance Use Topics   Alcohol use: No    Alcohol/week: 0.0 standard drinks of alcohol   Drug use: No       OPHTHALMIC EXAM:  Base Eye Exam     Visual Acuity (Snellen - Linear)       Right Left   Dist cc 20/25 20/20 +2    Correction: Glasses         Tonometry (Tonopen, 9:30 AM)       Right Left   Pressure 15 20         Pupils       Dark Light Shape React APD   Right 2 1 Round Minimal None   Left 2 1 Round Minimal None         Visual Fields       Left Right    Full Full         Extraocular Movement       Right Left    Full, Ortho Full, Ortho         Neuro/Psych      Oriented x3: Yes   Mood/Affect: Normal         Dilation     Both eyes: 1.0% Mydriacyl, 2.5% Phenylephrine @ 9:27 AM           Slit Lamp and Fundus Exam     Slit Lamp Exam       Right Left   Lids/Lashes Dermatochalasis -  upper lid, mild MGD Dermatochalasis - upper lid, mild MGD   Conjunctiva/Sclera nasal pingeucula, Melanosis nasal pingeucula, Melanosis   Cornea 2-3+ Punctate epithelial erosions, well healed cataract wound 2-3+ Punctate epithelial erosions, well healed cataract wound   Anterior Chamber Deep and quiet Deep and quiet   Iris round and poorly dilated to 60mm, No NVI round and poorly dilated to 44mm, No NVI   Lens PC IOL in good position PC IOL in good position   Anterior Vitreous Mild Vitreous syneresis, Posterior vitreous detachment, blood stained vitreous condensations inferiorly -- clearing centrally and settling inferiorly Mild Vitreous syneresis, Posterior vitreous detachment, vitreous condensations, interval improvement in blood stained vitreous condensations         Fundus Exam       Right Left   Disc mild Pallor, Sharp rim, +cupping mild Pallor, Sharp rim, +cupping, focal PPP temporal, vascular loops, early NVD -- stably improved   C/D Ratio 0.6 0.5   Macula Flat, good foveal reflex, scattered Microaneurysms, trace cystic changes - slightly increased, RPE mottling and clumping Flat, good foveal reflex, rare MA/DBH, mild cystic changes temporal mac - slightly improved   Vessels attenuated, mild tortuosity attenuated, Tortuous, no obvious NV   Periphery Attached, +NV along inferior arcades and nasal midzone - improved, 360 MA/DBH, pre-retinal hemes settled inferiorly - improved, good 360 PRP changes -- room for temporal fill in, scattered fibrosis Attached, 360 MA/DBH, focal exudates nasal midzone, no PRP laser           Refraction     Wearing Rx       Sphere Cylinder Axis Add   Right -0.75 +1.25 004 +2.75   Left -0.75 +1.25 004 +2.75     Type: PAL           IMAGING AND PROCEDURES  Imaging and Procedures for 06/27/2022  OCT, Retina - OU - Both Eyes       Right Eye Quality was good. Central Foveal Thickness: 295. Progression has worsened. Findings include no IRF, no SRF, abnormal foveal contour, epiretinal membrane, preretinal fibrosis (Perisistnet vitreous opacities settled inferiorly inferiorly, pre-retinal fibrosis inferiorly -- stable, mild interval increase in cystic changes temporal fovea and mac).   Left Eye Quality was borderline. Central Foveal Thickness: 259. Progression has been stable. Findings include normal foveal contour, no SRF, intraretinal hyper-reflective material, intraretinal fluid (Persistent IRF temporal macula, stable improvement in vitreous opacities).   Notes *Images captured and stored on drive  Diagnosis / Impression:  OD: Perisistnet vitreous opacities settled inferiorly inferiorly, pre-retinal fibrosis inferiorly -- stable, mild interval increase in cystic changes temporal fovea and mac OS: Persistent IRF temporal macula, stable improvement in vitreous opacities  Clinical management:  See below  Abbreviations: NFP - Normal foveal profile. CME - cystoid macular edema. PED - pigment epithelial detachment. IRF - intraretinal fluid. SRF - subretinal fluid. EZ - ellipsoid zone. ERM - epiretinal membrane. ORA - outer retinal atrophy. ORT - outer retinal tubulation. SRHM - subretinal hyper-reflective material. IRHM - intraretinal hyper-reflective material      Intravitreal Injection, Pharmacologic Agent - OD - Right Eye       Time Out 06/27/2022. 10:21 AM. Confirmed correct patient, procedure, site, and patient consented.   Anesthesia Topical anesthesia was used. Anesthetic medications included Lidocaine 2%, Proparacaine 0.5%.   Procedure Preparation included 5% betadine to ocular surface, eyelid speculum. A supplied (32g) needle was used.   Injection: 1.25 mg Bevacizumab  1.25mg /0.14ml   Route: Intravitreal, Site: Right Eye   NDC:  [id number], [id number], Expiration date: 07/31/2022   Post-op Post injection exam found visual acuity of at least counting fingers. The patient tolerated the procedure well. There were no complications. The patient received written and verbal post procedure care education. Post injection medications were not given.      Intravitreal Injection, Pharmacologic Agent - OS - Left Eye       Time Out 06/27/2022. 10:21 AM. Confirmed correct patient, procedure, site, and patient consented.   Anesthesia Topical anesthesia was used. Anesthetic medications included Lidocaine 2%, Proparacaine 0.5%.   Procedure Preparation included 5% betadine to ocular surface, eyelid speculum. A (32g) needle was used.   Injection: 1.25 mg Bevacizumab 1.25mg /0.57ml   Route: Intravitreal, Site: Left Eye   NDC: [id number], Lot: [id number], Expiration date: 09/24/2022   Post-op Post injection exam found visual acuity of at least counting fingers. The patient tolerated the procedure well. There were no complications. The patient received written and verbal post procedure care education.             ASSESSMENT/PLAN:    ICD-10-CM   1. Proliferative diabetic retinopathy of both eyes with macular edema associated with type 2 diabetes mellitus (HCC)  E11.3513 OCT, Retina - OU - Both Eyes    Intravitreal Injection, Pharmacologic Agent - OD - Right Eye    Intravitreal Injection, Pharmacologic Agent - OS - Left Eye    Bevacizumab (AVASTIN) SOLN 1.25 mg    Bevacizumab (AVASTIN) SOLN 1.25 mg    2. Vitreous hemorrhage, right eye (HCC)  H43.11     3. Epiretinal membrane (ERM) of right eye  H35.371     4. Essential hypertension  I10     5. Hypertensive retinopathy of both eyes  H35.033     6. Pseudophakia, both eyes  Z96.1     7. Ocular hypertension of right eye  H40.051      1,2. Proliferative diabetic retinopathy OU w/ vitreous hemorrhage, OD   - s/p IVA OD #1 (12.19.22), #2 (01.16.23), #3 (02.16.23), #4 (04.04.23), #5 (09.13.23), #6 (10.11.23), #7 (11.27.23), #8 (01.29.24)  - s/p IVA OS #1 (09.13.23), #2 (10.11.23), #3 (11.27.23), #4 (01.29.24)  - s/p PRP OD (02.27.23), (09.19.23)  - referred by [facility] -- formerly followed by Dr. Anderson Malta - BCVA OD: 20/25 (stable) ; 20/20 OS  - FA (09.05.23) OD: Perisistnet vitreous opacities settled inferiorly inferiorly, pre-retinal fibrosis inferiorly -- stable, mild interval increase in cystic changes temporal fovea and mac OS: Mild NVD, vascular non-perfusion and perivascular leakage temporal periphery **pt would benefit from PRP OS** - OCT today shows OD: Stable improvement in vitreous opacities -- persistent inferiorly, pre-retinal fibrosis inferiorly -- stable, stable improvement in cystic changes temporal fovea and mac; OS: Persistent IRF temporal macula, stable improvement in vitreous opacities at 8 wks - recommend IVA OU (OD #9 and OS #5) today, 03.25.24 with follow up in 7-8 weeks - pt wishes to proceed with injection - RBA of procedure discussed, questions answered - IVA informed consent obtained and signed, 09.13.23 (OS) - see procedure note  - VH precautions reviewed -- minimize activities, keep head elevated, avoid ASA/NSAIDs/blood thinners as able  - f/u 7-8 weeks, DFE, OCT, possible injection(s)  3. Epiretinal membrane, right eye  - mild ERM OD - BCVA 20/25 - stable - asymptomatic, no metamorphopsia - no indication for surgery at this time - continue to monitor  4,5. Hypertensive retinopathy OU - discussed importance of tight BP control - continue to monitor  6. Pseudophakia OU  - s/p  CE/IOL OU  - IOLs in good position, doing well  - continue to monitor  7. Ocular Hypertension OD  - 15 OD on Brim BID OD  - continue Brimonidine bid OD  Ophthalmic Meds Ordered this visit:  Meds ordered this encounter  Medications   Bevacizumab (AVASTIN) SOLN 1.25 mg   Bevacizumab  (AVASTIN) SOLN 1.25 mg     Return in about 7 weeks (around 08/15/2022) for f/u PDR OU, DFE, OCT, Possible, IVA, OU.  There are no Patient Instructions on file for this visit.   Explained the diagnoses, plan, and follow up with the patient and they expressed understanding.  Patient expressed understanding of the importance of proper follow up care.   This document serves as a record of services personally performed by Gardiner Sleeper, MD, PhD. It was created on their behalf by Roselee Nova, COMT. The creation of this record is the provider's dictation and/or activities during the visit.  Electronically signed by: Roselee Nova, COMT 06/27/22 12:19 PM  This document serves as a record of services personally performed by Gardiner Sleeper, MD, PhD. It was created on their behalf by Renaldo Reel, [place] an ophthalmic technician. The creation of this record is the provider's dictation and/or activities during the visit.    Electronically signed by:  Renaldo Reel, COT 03.25.24 12:19 PM   Gardiner Sleeper, M.D., Ph.D. Diseases & Surgery of the Retina and Vitreous [facility]  I have reviewed the above documentation for accuracy and completeness, and I agree with the above. Gardiner Sleeper, M.D., Ph.D. 06/27/22 12:20 PM   Abbreviations: M myopia (nearsighted); A astigmatism; H hyperopia (farsighted); P presbyopia; Mrx spectacle prescription;  CTL contact lenses; OD right eye; OS left eye; OU both eyes  XT exotropia; ET esotropia; PEK punctate epithelial keratitis; PEE punctate epithelial erosions; DES dry eye syndrome; MGD meibomian gland dysfunction; ATs artificial tears; PFAT's preservative free artificial tears; [place] nuclear sclerotic cataract; PSC posterior subcapsular cataract; ERM epi-retinal membrane; PVD posterior vitreous detachment; RD retinal detachment; DM diabetes mellitus; DR diabetic retinopathy; NPDR non-proliferative diabetic retinopathy; PDR  proliferative diabetic retinopathy; CSME clinically significant macular edema; DME diabetic macular edema; dbh dot blot hemorrhages; CWS cotton wool spot; POAG primary open angle glaucoma; C/D cup-to-disc ratio; HVF humphrey visual field; GVF goldmann visual field; OCT optical coherence tomography; IOP intraocular pressure; BRVO Branch retinal vein occlusion; CRVO central retinal vein occlusion; CRAO central retinal artery occlusion; BRAO branch retinal artery occlusion; RT retinal tear; SB scleral buckle; PPV pars plana vitrectomy; VH Vitreous hemorrhage; PRP panretinal laser photocoagulation; IVK intravitreal kenalog; VMT vitreomacular traction; MH Macular hole;  NVD neovascularization of the disc; NVE neovascularization elsewhere; AREDS age related eye disease study; ARMD age related macular degeneration; POAG primary open angle glaucoma; EBMD epithelial/anterior basement membrane dystrophy; ACIOL anterior chamber intraocular lens; IOL intraocular lens; PCIOL posterior chamber intraocular lens; Phaco/IOL phacoemulsification with intraocular lens placement; [place] photorefractive keratectomy; LASIK laser assisted in situ keratomileusis; HTN hypertension; DM diabetes mellitus; COPD chronic obstructive pulmonary disease

## 2022-06-27 ENCOUNTER — Ambulatory Visit (INDEPENDENT_AMBULATORY_CARE_PROVIDER_SITE_OTHER): Payer: No Typology Code available for payment source | Admitting: Ophthalmology

## 2022-06-27 ENCOUNTER — Encounter (INDEPENDENT_AMBULATORY_CARE_PROVIDER_SITE_OTHER): Payer: Self-pay | Admitting: Ophthalmology

## 2022-06-27 DIAGNOSIS — H35371 Puckering of macula, right eye: Secondary | ICD-10-CM

## 2022-06-27 DIAGNOSIS — H4311 Vitreous hemorrhage, right eye: Secondary | ICD-10-CM | POA: Diagnosis not present

## 2022-06-27 DIAGNOSIS — E113513 Type 2 diabetes mellitus with proliferative diabetic retinopathy with macular edema, bilateral: Secondary | ICD-10-CM

## 2022-06-27 DIAGNOSIS — I10 Essential (primary) hypertension: Secondary | ICD-10-CM | POA: Diagnosis not present

## 2022-06-27 DIAGNOSIS — H35033 Hypertensive retinopathy, bilateral: Secondary | ICD-10-CM

## 2022-06-27 DIAGNOSIS — H40051 Ocular hypertension, right eye: Secondary | ICD-10-CM

## 2022-06-27 DIAGNOSIS — Z961 Presence of intraocular lens: Secondary | ICD-10-CM

## 2022-06-27 MED ORDER — BEVACIZUMAB CHEMO INJECTION 1.25MG/0.05ML SYRINGE FOR KALEIDOSCOPE
1.2500 mg | INTRAVITREAL | Status: AC | PRN
  Administered 2022-06-27: 1.25 mg via INTRAVITREAL

## 2022-08-12 NOTE — Progress Notes (Shared)
[facility] - Clinic Note  08/15/2022    CHIEF COMPLAINT Patient presents for Retina Follow Up   HISTORY OF PRESENT ILLNESS: Matthew Sellers is a 82 y.o. male who presents to the clinic today for:   HPI     Retina Follow Up           Diagnosis: Diabetic Retinopathy   Laterality: both eyes   Onset: 7 weeks ago   Duration: 7 weeks   Course: stable         Comments   7 week retina follow up PDR OU and IVA OU pt is reporting no vision changes noticed he denies any flashes or floaters his last reading was 130 this am       Last edited by Etheleen Mayhew, COT on 08/15/2022  9:10 AM.        Referring physician: No referring provider defined for this encounter.  HISTORICAL INFORMATION:   Selected notes from the MEDICAL RECORD NUMBER [facility] LEE:  Ocular Hx- PMH   CURRENT MEDICATIONS: Current Outpatient Medications (Ophthalmic Drugs)  Medication Sig   brimonidine (ALPHAGAN) 0.2 % ophthalmic solution Place 1 drop into the right eye 2 (two) times daily.   Brinzolamide-Brimonidine 1-0.2 % SUSP Place 1 drop into both eyes 2 (two) times daily.   carboxymethylcellulose (REFRESH PLUS) 0.5 % SOLN INSTILL 1 DROP IN BOTH EYES FIVE TIMES A DAY   latanoprost (XALATAN) 0.005 % ophthalmic solution Place 1 drop into both eyes at bedtime.   Latanoprost 0.005 % EMUL    timolol (TIMOPTIC-XR) 0.5 % ophthalmic gel-forming SMARTSIG:In Eye(s)   No current facility-administered medications for this visit. (Ophthalmic Drugs)   Current Outpatient Medications (Other)  Medication Sig   amLODipine (NORVASC) 10 MG tablet Take 10 mg by mouth daily.   aspirin EC 81 MG tablet Take 81 mg by mouth daily.   cefpodoxime (VANTIN) 200 MG tablet    fluticasone (FLONASE) 50 MCG/ACT nasal spray Place 2 sprays into both nostrils daily as needed for allergies or rhinitis.   gabapentin (NEURONTIN) 100 MG capsule Take 1 capsule by mouth 3 (three) times  daily.   gabapentin (NEURONTIN) 100 MG capsule Take 2 capsules by mouth at bedtime.   HYDROcodone-acetaminophen (NORCO/VICODIN) 5-325 MG tablet Take 1-2 tablets by mouth every 4 (four) hours as needed for moderate pain.   insulin glargine (LANTUS) 100 UNIT/ML injection Inject 30 Units into the skin at bedtime.    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   Krill Oil 300 MG CAPS Take 300 mg by mouth daily.   lidocaine (LIDODERM) 5 % Place 1 patch onto the skin daily. Remove & Discard patch within 12 hours or as directed by MD   lisinopril (ZESTRIL) 20 MG tablet Take 2 tablets by mouth daily.   lisinopril-hydrochlorothiazide (ZESTORETIC) 20-25 MG tablet Take 2 tablets by mouth every morning.   metFORMIN (GLUCOPHAGE) 1000 MG tablet Take 1,000 mg by mouth 2 (two) times daily with a meal.   nitroGLYCERIN (NITROSTAT) 0.4 MG SL tablet Place 0.4 mg under the tongue every 5 (five) minutes as needed for chest pain.   pravastatin (PRAVACHOL) 20 MG tablet Take 20 mg by mouth at bedtime.   PRECISION XTRA TEST STRIPS test strip    No current facility-administered medications for this visit. (Other)   REVIEW  OF SYSTEMS: ROS   Positive for: Genitourinary, Endocrine, Cardiovascular, Eyes Negative for: Constitutional, Gastrointestinal, Neurological, Skin, Musculoskeletal, HENT, Respiratory, Psychiatric, Allergic/Imm, Heme/Lymph Last edited by Etheleen Mayhew, COT on 08/15/2022  9:10 AM.       ALLERGIES No Known Allergies  PAST MEDICAL HISTORY Past Medical History:  Diagnosis Date   Anemia    Bradycardia    CAD (coronary artery disease)    Chest tightness    Chronic kidney disease    Diabetes mellitus without complication (HCC)    Diabetic retinopathy (HCC)    Dyslipidemia    Dyspnea on exertion    Glaucoma    Hypertension    Obesity    Paroxysmal  atrial fibrillation (HCC)    a. identified on device interrogation   Pneumonia    Past Surgical History:  Procedure Laterality Date   ACHILLES TENDON SURGERY     for rupture   CARDIAC CATHETERIZATION  07/08/2009   mild to mod . prox LAD 50% (Dr. Garen Lah)   CATARACT EXTRACTION     CHOLECYSTECTOMY N/A 10/11/2017   Procedure: LAPAROSCOPIC CHOLECYSTECTOMY WITH INTRAOPERATIVE CHOLANGIOGRAM;  Surgeon: Ovidio Kin, MD;  Location: [facility] OR;  Service: General;  Laterality: N/A;   DOPPLER ECHOCARDIOGRAPHY  04/10/2008   LVEF 70-80% ,norm   EYE SURGERY     PACEMAKER IMPLANT N/A 10/12/2017   Procedure: PACEMAKER IMPLANT;  Surgeon: Marinus Maw, MD;  Location: [facility]IVE [facility];  Service: Cardiovascular;  Laterality: N/A;   stress myoview  04/09/2008   EF 54%; LV  norm   TEMPORARY PACEMAKER N/A 10/11/2017   Procedure: TEMPORARY PACEMAKER;  Surgeon: Marinus Maw, MD;  Location: [facility] CV [facility];  Service: Cardiovascular;  Laterality: N/A;   FAMILY HISTORY Family History  Problem Relation Age of Onset   Leukemia Mother    SOCIAL HISTORY Social History   Tobacco Use   Smoking status: Never   Smokeless tobacco: Never  Vaping Use   Vaping Use: Never used  Substance Use Topics   Alcohol use: No    Alcohol/week: 0.0 standard drinks of alcohol   Drug use: No       OPHTHALMIC EXAM:  Base Eye Exam     Visual Acuity (Snellen - Linear)       Right Left   Dist cc 20/20 -2 20/20 -1         Tonometry (Tonopen, 9:14 AM)       Right Left   Pressure 17 20         Pupils       Pupils Dark Light Shape React APD   Right PERRL 2 1 Round Brisk None   Left PERRL 2 1 Round Brisk None         Visual Fields       Left Right    Full Full         Extraocular Movement       Right Left    Full, Ortho Full, Ortho         Neuro/Psych     Oriented x3: Yes   Mood/Affect: Normal         Dilation     Both eyes: 2.5% Phenylephrine @ 9:14 AM           Slit  Lamp and Fundus Exam     Slit Lamp Exam       Right Left   Lids/Lashes Dermatochalasis - upper lid, mild MGD Dermatochalasis - upper lid, mild MGD  Conjunctiva/Sclera nasal pingeucula, Melanosis nasal pingeucula, Melanosis   Cornea 2-3+ Punctate epithelial erosions, well healed cataract wound 2-3+ Punctate epithelial erosions, well healed cataract wound   Anterior Chamber Deep and quiet Deep and quiet   Iris round and poorly dilated to 4mm, No NVI round and poorly dilated to 4mm, No NVI   Lens PC IOL in good position PC IOL in good position   Vitreous Mild Vitreous syneresis, Posterior vitreous detachment, blood stained vitreous condensations inferiorly -- clearing centrally and settling inferiorly Mild Vitreous syneresis, Posterior vitreous detachment, vitreous condensations, interval improvement in blood stained vitreous condensations         Fundus Exam       Right Left   Disc mild Pallor, Sharp rim, +cupping mild Pallor, Sharp rim, +cupping, focal PPP temporal, vascular loops, early NVD -- stably improved   C/D Ratio 0.6 0.5   Macula Flat, good foveal reflex, scattered Microaneurysms, trace cystic changes - slightly increased, RPE mottling and clumping Flat, good foveal reflex, rare MA/DBH, mild cystic changes temporal mac - slightly improved   Vessels attenuated, mild tortuosity attenuated, Tortuous, no obvious NV   Periphery Attached, +NV along inferior arcades and nasal midzone - improved, 360 MA/DBH, pre-retinal hemes settled inferiorly - improved, good 360 PRP changes -- room for temporal fill in, scattered fibrosis Attached, 360 MA/DBH, focal exudates nasal midzone, no PRP laser           Refraction     Wearing Rx       Sphere Cylinder Axis Add   Right -0.75 +1.25 004 +2.75   Left -0.75 +1.25 004 +2.75    Type: PAL           IMAGING AND PROCEDURES  Imaging and Procedures for 08/15/2022           ASSESSMENT/PLAN:    ICD-10-CM   1. Proliferative  diabetic retinopathy of both eyes with macular edema associated with type 2 diabetes mellitus (HCC)  E11.3513 OCT, Retina - OU - Both Eyes    2. Vitreous hemorrhage, right eye (HCC)  H43.11     3. Epiretinal membrane (ERM) of right eye  H35.371     4. Essential hypertension  I10     5. Hypertensive retinopathy of both eyes  H35.033     6. Pseudophakia, both eyes  Z96.1     7. Ocular hypertension of right eye  H40.051      1-4. Proliferative diabetic retinopathy OU w/ vitreous hemorrhage, OD  - s/p IVA OD #1 (12.19.22), #2 (01.16.23), #3 (02.16.23), #4 (04.04.23), #5 (09.13.23), #6 (10.11.23), #7 (11.27.23), #8 (01.29.24), #9 (03.25.24)  - s/p IVA OS #1 (09.13.23), #2 (10.11.23), #3 (11.27.23), #4 (01.29.24), #5 (03.25.24)  - s/p PRP OD (02.27.23), (09.19.23)  - referred by [facility] -- formerly followed by Dr. Clarisa Kindred - BCVA OD: 20/25 (stable) ; 20/20 OS  - FA (09.05.23) OD: Perisistnet vitreous opacities settled inferiorly inferiorly, pre-retinal fibrosis inferiorly -- stable, mild interval increase in cystic changes temporal fovea and mac OS: Mild NVD, vascular non-perfusion and perivascular leakage temporal periphery **pt would benefit from PRP OS** - OCT today shows OD: Persistent vitreous opacities settled inferiorly, pre-retinal fibrosis inferiorly -- stable, mild interval improvement in cystic changes temporal fovea and mac; OS: Interval improvement in IRF temporal macula, stable improvement in vitreous opacities - recommend IVA OU (OD #10 and OS #6) today, 05.13.24 with follow up in 7-8 weeks - pt wishes to proceed with injection - RBA of procedure discussed, questions  answered - IVA informed consent obtained and signed, 09.13.23 (OS) - see procedure note  - VH precautions reviewed -- minimize activities, keep head elevated, avoid ASA/NSAIDs/blood thinners as able  - f/u May 28 at 9:45, DFE, OCT, possible PRP OS - f/u 7 weeks for DFE, OCT, possible IVA OU  5. Epiretinal membrane,  right eye  - mild ERM OD - BCVA 20/25 - stable - asymptomatic, no metamorphopsia - no indication for surgery at this time - continue to monitor  6. Hypertensive retinopathy OU - discussed importance of tight BP control - continue to monitor  7. Pseudophakia OU  - s/p CE/IOL OU  - IOLs in good position, doing well  - continue to monitor  7. Ocular Hypertension OD  - 17 OD on Brim BID OD  - continue Brimonidine bid OD  Ophthalmic Meds Ordered this visit:  No orders of the defined types were placed in this encounter.    No follow-ups on file.  There are no Patient Instructions on file for this visit.   Explained the diagnoses, plan, and follow up with the patient and they expressed understanding.  Patient expressed understanding of the importance of proper follow up care.   This document serves as a record of services personally performed by Karie Chimera, MD, PhD. It was created on their behalf by Glee Arvin. Manson Passey, OA an ophthalmic technician. The creation of this record is the provider's dictation and/or activities during the visit.    Electronically signed by: Glee Arvin. Manson Passey, [place] 05.10.2024 9:35 AM    Karie Chimera, M.D., Ph.D. Diseases & Surgery of the Retina and Vitreous [facility]     Abbreviations: M myopia (nearsighted); A astigmatism; H hyperopia (farsighted); P presbyopia; Mrx spectacle prescription;  CTL contact lenses; OD right eye; OS left eye; OU both eyes  XT exotropia; ET esotropia; PEK punctate epithelial keratitis; PEE punctate epithelial erosions; DES dry eye syndrome; MGD meibomian gland dysfunction; ATs artificial tears; PFAT's preservative free artificial tears; NSC nuclear sclerotic cataract; PSC posterior subcapsular cataract; ERM epi-retinal membrane; PVD posterior vitreous detachment; RD retinal detachment; DM diabetes mellitus; DR diabetic retinopathy; NPDR non-proliferative diabetic retinopathy; PDR proliferative  diabetic retinopathy; CSME clinically significant macular edema; DME diabetic macular edema; dbh dot blot hemorrhages; CWS cotton wool spot; POAG primary open angle glaucoma; C/D cup-to-disc ratio; HVF humphrey visual field; GVF goldmann visual field; OCT optical coherence tomography; IOP intraocular pressure; BRVO Branch retinal vein occlusion; CRVO central retinal vein occlusion; CRAO central retinal artery occlusion; BRAO branch retinal artery occlusion; RT retinal tear; SB scleral buckle; PPV pars plana vitrectomy; VH Vitreous hemorrhage; PRP panretinal laser photocoagulation; IVK intravitreal kenalog; VMT vitreomacular traction; MH Macular hole;  NVD neovascularization of the disc; NVE neovascularization elsewhere; AREDS age related eye disease study; ARMD age related macular degeneration; POAG primary open angle glaucoma; EBMD epithelial/anterior basement membrane dystrophy; ACIOL anterior chamber intraocular lens; IOL intraocular lens; PCIOL posterior chamber intraocular lens; Phaco/IOL phacoemulsification with intraocular lens placement; PRK photorefractive keratectomy; LASIK laser assisted in situ keratomileusis; HTN hypertension; DM diabetes mellitus; COPD chronic obstructive pulmonary disease

## 2022-08-12 NOTE — Progress Notes (Signed)
[facility] - Clinic Note  08/15/2022    CHIEF COMPLAINT Patient presents for Retina Follow Up   HISTORY OF PRESENT ILLNESS: Matthew Sellers is a 82 y.o. male who presents to the clinic today for:   HPI     Retina Follow Up   Patient presents with  Diabetic Retinopathy.  In both eyes.  This started 7 weeks ago.  Duration of 7 weeks.  Since onset it is stable.  I, the attending physician,  performed the HPI with the patient and updated documentation appropriately.        Comments   7 week retina follow up PDR OU and IVA OU pt is reporting no vision changes noticed he denies any flashes or floaters his last reading was 130 this am       Last edited by Rennis Chris, MD on 08/15/2022 11:55 AM.      Referring physician: No referring provider defined for this encounter.  HISTORICAL INFORMATION:   Selected notes from the MEDICAL RECORD NUMBER [facility]EE:  Ocular Hx- PMH   CURRENT MEDICATIONS: Current Outpatient Medications (Ophthalmic Drugs)  Medication Sig   brimonidine (ALPHAGAN) 0.2 % ophthalmic solution Place 1 drop into the right eye 2 (two) times daily.   Brinzolamide-Brimonidine 1-0.2 % SUSP Place 1 drop into both eyes 2 (two) times daily.   carboxymethylcellulose (REFRESH PLUS) 0.5 % SOLN INSTILL 1 DROP IN BOTH EYES FIVE TIMES A DAY   latanoprost (XALATAN) 0.005 % ophthalmic solution Place 1 drop into both eyes at bedtime.   Latanoprost 0.005 % EMUL    timolol (TIMOPTIC-XR) 0.5 % ophthalmic gel-forming SMARTSIG:In Eye(s)   No current facility-administered medications for this visit. (Ophthalmic Drugs)   Current Outpatient Medications (Other)  Medication Sig   amLODipine (NORVASC) 10 MG tablet Take 10 mg by mouth daily.   aspirin EC 81 MG tablet Take 81 mg by mouth daily.   cefpodoxime (VANTIN) 200 MG tablet    fluticasone (FLONASE) 50 MCG/ACT nasal spray Place 2 sprays into both nostrils daily as needed for allergies  or rhinitis.   gabapentin (NEURONTIN) 100 MG capsule Take 1 capsule by mouth 3 (three) times daily.   gabapentin (NEURONTIN) 100 MG capsule Take 2 capsules by mouth at bedtime.   HYDROcodone-acetaminophen (NORCO/VICODIN) 5-325 MG tablet Take 1-2 tablets by mouth every 4 (four) hours as needed for moderate pain.   insulin glargine (LANTUS) 100 UNIT/ML injection Inject 30 Units into the skin at bedtime.    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   Krill Oil 300 MG CAPS Take 300 mg by mouth daily.   lidocaine (LIDODERM) 5 % Place 1 patch onto the skin daily. Remove & Discard patch within 12 hours or as directed by MD   lisinopril (ZESTRIL) 20 MG tablet Take 2 tablets by mouth daily.   lisinopril-hydrochlorothiazide (ZESTORETIC) 20-25 MG tablet Take 2 tablets by mouth every morning.   metFORMIN (GLUCOPHAGE) 1000 MG tablet Take 1,000 mg by mouth 2 (two) times daily with a meal.   nitroGLYCERIN (NITROSTAT) 0.4 MG SL tablet Place 0.4 mg under the tongue every 5 (five) minutes as needed for chest pain.   pravastatin (PRAVACHOL) 20 MG tablet Take 20 mg by mouth at bedtime.   PRECISION XTRA TEST STRIPS test strip    No current facility-administered medications  for this visit. (Other)   REVIEW OF SYSTEMS: ROS   Positive for: Genitourinary, Endocrine, Cardiovascular, Eyes Negative for: Constitutional, Gastrointestinal, Neurological, Skin, Musculoskeletal, HENT, Respiratory, Psychiatric, Allergic/Imm, Heme/Lymph Last edited by Etheleen Mayhew, COT on 08/15/2022  9:10 AM.       ALLERGIES No Known Allergies  PAST MEDICAL HISTORY Past Medical History:  Diagnosis Date   Anemia    Bradycardia    CAD (coronary artery disease)    Chest tightness    Chronic kidney disease    Diabetes mellitus without complication (HCC)    Diabetic retinopathy (HCC)     Dyslipidemia    Dyspnea on exertion    Glaucoma    Hypertension    Obesity    Paroxysmal atrial fibrillation (HCC)    a. identified on device interrogation   Pneumonia    Past Surgical History:  Procedure Laterality Date   ACHILLES TENDON SURGERY     for rupture   CARDIAC CATHETERIZATION  07/08/2009   mild to mod . prox LAD 50% (Dr. Garen Lah)   CATARACT EXTRACTION     CHOLECYSTECTOMY N/A 10/11/2017   Procedure: LAPAROSCOPIC CHOLECYSTECTOMY WITH INTRAOPERATIVE CHOLANGIOGRAM;  Surgeon: Ovidio Kin, MD;  Location: [facility] OR;  Service: General;  Laterality: N/A;   DOPPLER ECHOCARDIOGRAPHY  04/10/2008   LVEF 70-80% ,norm   EYE SURGERY     PACEMAKER IMPLANT N/A 10/12/2017   Procedure: PACEMAKER IMPLANT;  Surgeon: Marinus Maw, MD;  Location: [facility] INVASIVE CV LAB;  Service: Cardiovascular;  Laterality: N/A;   stress myoview  04/09/2008   EF 54%; LV  norm   TEMPORARY PACEMAKER N/A 10/11/2017   Procedure: TEMPORARY PACEMAKER;  Surgeon: Marinus Maw, MD;  Location: [facility] INVASIVE CV LAB;  Service: Cardiovascular;  Laterality: N/A;   FAMILY HISTORY Family History  Problem Relation Age of Onset   Leukemia Mother    SOCIAL HISTORY Social History   Tobacco Use   Smoking status: Never   Smokeless tobacco: Never  Vaping Use   Vaping Use: Never used  Substance Use Topics   Alcohol use: No    Alcohol/week: 0.0 standard drinks of alcohol   Drug use: No       OPHTHALMIC EXAM:  Base Eye Exam     Visual Acuity (Snellen - Linear)       Right Left   Dist cc 20/20 -2 20/20 -1         Tonometry (Tonopen, 9:14 AM)       Right Left   Pressure 17 20         Pupils       Pupils Dark Light Shape React APD   Right PERRL 2 1 Round Brisk None   Left PERRL 2 1 Round Brisk None         Visual Fields       Left Right    Full Full         Extraocular Movement       Right Left    Full, Ortho Full, Ortho         Neuro/Psych     Oriented x3: Yes   Mood/Affect:  Normal         Dilation     Both eyes: 2.5% Phenylephrine @ 9:14 AM           Slit Lamp and Fundus Exam     Slit Lamp Exam       Right Left   Lids/Lashes Dermatochalasis - upper lid, mild MGD  Dermatochalasis - upper lid, mild MGD   Conjunctiva/Sclera nasal pingeucula, Melanosis nasal pingeucula, Melanosis   Cornea 2-3+ Punctate epithelial erosions, well healed cataract wound 2-3+ Punctate epithelial erosions, well healed cataract wound   Anterior Chamber Deep and quiet Deep and quiet   Iris round and poorly dilated to 4mm, No NVI round and poorly dilated to 4mm, No NVI   Lens PC IOL in good position PC IOL in good position   Anterior Vitreous Mild Vitreous syneresis, Posterior vitreous detachment, blood stained vitreous condensations inferiorly -- clearing centrally and settling inferiorly Mild Vitreous syneresis, Posterior vitreous detachment, vitreous condensations, interval improvement in blood stained vitreous condensations         Fundus Exam       Right Left   Disc mild Pallor, Sharp rim, +cupping mild Pallor, Sharp rim, +cupping, focal PPP temporal, vascular loops, early NVD -- stably improved   C/D Ratio 0.6 0.5   Macula Flat, good foveal reflex, scattered Microaneurysms, trace cystic changes -- improved, RPE mottling and clumping Flat, good foveal reflex, rare MA/DBH, mild cystic changes temporal mac - slightly improved   Vessels attenuated, Tortuous attenuated, Tortuous, no obvious NV   Periphery Attached, +NV along inferior arcades and nasal midzone - improved, 360 MA/DBH, pre-retinal hemes settled inferiorly - improved, good 360 PRP changes -- room for temporal fill in, scattered fibrosis Attached, 360 MA/DBH, focal exudates nasal midzone, no PRP laser           Refraction     Wearing Rx       Sphere Cylinder Axis Add   Right -0.75 +1.25 004 +2.75   Left -0.75 +1.25 004 +2.75    Type: PAL           IMAGING AND PROCEDURES  Imaging and Procedures  for 08/15/2022  OCT, Retina - OU - Both Eyes       Right Eye Quality was good. Central Foveal Thickness: 289. Progression has improved. Findings include no IRF, no SRF, abnormal foveal contour, epiretinal membrane, preretinal fibrosis (Persistent vitreous opacities settled inferiorly, pre-retinal fibrosis inferiorly -- stable, mild interval improvement in cystic changes temporal fovea and mac).   Left Eye Quality was borderline. Central Foveal Thickness: 258. Progression has improved. Findings include normal foveal contour, no SRF, intraretinal hyper-reflective material, intraretinal fluid (Interval improvement in IRF temporal macula, stable improvement in vitreous opacities).   Notes *Images captured and stored on drive  Diagnosis / Impression:  OD: Persistent vitreous opacities settled inferiorly, pre-retinal fibrosis inferiorly -- stable, mild interval improvement in cystic changes temporal fovea and mac OS: Interval improvement in IRF temporal macula, stable improvement in vitreous opacities  Clinical management:  See below  Abbreviations: NFP - Normal foveal profile. CME - cystoid macular edema. PED - pigment epithelial detachment. IRF - intraretinal fluid. SRF - subretinal fluid. EZ - ellipsoid zone. ERM - epiretinal membrane. ORA - outer retinal atrophy. ORT - outer retinal tubulation. SRHM - subretinal hyper-reflective material. IRHM - intraretinal hyper-reflective material      Intravitreal Injection, Pharmacologic Agent - OD - Right Eye       Time Out 08/15/2022. 9:39 AM. Confirmed correct patient, procedure, site, and patient consented.   Anesthesia Topical anesthesia was used. Anesthetic medications included Lidocaine 2%, Proparacaine 0.5%.   Procedure Preparation included 5% betadine to ocular surface, eyelid speculum. A supplied (32g) needle was used.   Injection: 1.25 mg Bevacizumab 1.25mg /0.45ml   Route: Intravitreal, Site: Right Eye   NDC: P3213405, Lot:  [id number], Expiration date:  09/26/2022   Post-op Post injection exam found visual acuity of at least counting fingers. The patient tolerated the procedure well. There were no complications. The patient received written and verbal post procedure care education. Post injection medications were not given.      Intravitreal Injection, Pharmacologic Agent - OS - Left Eye       Time Out 08/15/2022. 9:39 AM. Confirmed correct patient, procedure, site, and patient consented.   Anesthesia Topical anesthesia was used. Anesthetic medications included Lidocaine 2%, Proparacaine 0.5%.   Procedure Preparation included 5% betadine to ocular surface, eyelid speculum. A (32g) needle was used.   Injection: 1.25 mg Bevacizumab 1.25mg /0.29ml   Route: Intravitreal, Site: Left Eye   NDC: P3213405, Lot: [id number], Expiration date: 10/27/2022   Post-op Post injection exam found visual acuity of at least counting fingers. The patient tolerated the procedure well. There were no complications. The patient received written and verbal post procedure care education. Post injection medications were not given.            ASSESSMENT/PLAN:    ICD-10-CM   1. Proliferative diabetic retinopathy of both eyes with macular edema associated with type 2 diabetes mellitus (HCC)  E11.3513 OCT, Retina - OU - Both Eyes    Intravitreal Injection, Pharmacologic Agent - OD - Right Eye    Intravitreal Injection, Pharmacologic Agent - OS - Left Eye    Bevacizumab (AVASTIN) SOLN 1.25 mg    Bevacizumab (AVASTIN) SOLN 1.25 mg    2. Vitreous hemorrhage, right eye (HCC)  H43.11     3. Epiretinal membrane (ERM) of right eye  H35.371     4. Essential hypertension  I10     5. Hypertensive retinopathy of both eyes  H35.033     6. Pseudophakia, both eyes  Z96.1     7. Ocular hypertension of right eye  H40.051      1,2. Proliferative diabetic retinopathy OU w/ vitreous hemorrhage, OD  - s/p IVA OD #1 (12.19.22), #2  (01.16.23), #3 (02.16.23), #4 (04.04.23), #5 (09.13.23), #6 (10.11.23), #7 (11.27.23), #8 (01.29.24), #9 (03.25.24)  - s/p IVA OS #1 (09.13.23), #2 (10.11.23), #3 (11.27.23), #4 (01.29.24), #5 (03.25.24)  - s/p PRP OD (02.27.23), (09.19.23)  - referred by [facility] -- formerly followed by Dr. Clarisa Kindred - BCVA OD: 20/25 (stable) ; 20/20 OS  - FA (09.05.23) OD: Perisistent vitreous opacities settled inferiorly inferiorly, pre-retinal fibrosis inferiorly -- stable, mild interval increase in cystic changes temporal fovea and mac OS: Mild NVD, vascular non-perfusion and perivascular leakage temporal periphery **pt would benefit from PRP OS** - OCT today shows OD: Persistent vitreous opacities settled inferiorly, pre-retinal fibrosis inferiorly -- stable, mild interval improvement in cystic changes temporal fovea and mac; OS: Interval improvement in IRF temporal macula, stable improvement in vitreous opacities at 7 wks - recommend IVA OU (OD #10 and OS #6) today, 05.13.24 with follow up in 7 weeks again - pt wishes to proceed with injection - RBA of procedure discussed, questions answered - IVA informed consent obtained and signed, 09.13.23 (OS) - see procedure note  - VH precautions reviewed -- minimize activities, keep head elevated, avoid ASA/NSAIDs/blood thinners as able  - f/u 7 weeks, DFE, OCT, possible injection(s) - f/u 05.28.24, 945 am for laser PRP OS  3. Epiretinal membrane, right eye  - mild ERM OD - BCVA 20/25 - stable - asymptomatic, no metamorphopsia - no indication for surgery at this time - continue to monitor  4,5. Hypertensive retinopathy OU - discussed importance of  tight BP control - continue to monitor  6. Pseudophakia OU  - s/p CE/IOL OU  - IOLs in good position, doing well  - continue to monitor  7. Ocular Hypertension OD  - 17 OD on Brim BID OD  - continue brimonidine bid OD  Ophthalmic Meds Ordered this visit:  Meds ordered this encounter  Medications   Bevacizumab  (AVASTIN) SOLN 1.25 mg   Bevacizumab (AVASTIN) SOLN 1.25 mg     Return in about 15 days (around 08/30/2022) for f/u PDR OU, DFE, OCT, PRP OS.  There are no Patient Instructions on file for this visit.   Explained the diagnoses, plan, and follow up with the patient and they expressed understanding.  Patient expressed understanding of the importance of proper follow up care.   This document serves as a record of services personally performed by Karie Chimera, MD, PhD. It was created on their behalf by Annalee Genta, COMT. The creation of this record is the provider's dictation and/or activities during the visit.  Electronically signed by: Annalee Genta, COMT 08/15/22 11:57 AM  Karie Chimera, M.D., Ph.D. Diseases & Surgery of the Retina and Vitreous [facility]  I have reviewed the above documentation for accuracy and completeness, and I agree with the above. Karie Chimera, M.D., Ph.D. 08/15/22 12:01 PM  Abbreviations: M myopia (nearsighted); A astigmatism; H hyperopia (farsighted); P presbyopia; Mrx spectacle prescription;  CTL contact lenses; OD right eye; OS left eye; OU both eyes  XT exotropia; ET esotropia; PEK punctate epithelial keratitis; PEE punctate epithelial erosions; DES dry eye syndrome; MGD meibomian gland dysfunction; ATs artificial tears; PFAT's preservative free artificial tears; NSC nuclear sclerotic cataract; PSC posterior subcapsular cataract; ERM epi-retinal membrane; PVD posterior vitreous detachment; RD retinal detachment; DM diabetes mellitus; DR diabetic retinopathy; NPDR non-proliferative diabetic retinopathy; PDR proliferative diabetic retinopathy; CSME clinically significant macular edema; DME diabetic macular edema; dbh dot blot hemorrhages; CWS cotton wool spot; POAG primary open angle glaucoma; C/D cup-to-disc ratio; HVF humphrey visual field; GVF goldmann visual field; OCT optical coherence tomography; IOP intraocular pressure; BRVO Branch  retinal vein occlusion; CRVO central retinal vein occlusion; CRAO central retinal artery occlusion; BRAO branch retinal artery occlusion; RT retinal tear; SB scleral buckle; PPV pars plana vitrectomy; VH Vitreous hemorrhage; PRP panretinal laser photocoagulation; IVK intravitreal kenalog; VMT vitreomacular traction; MH Macular hole;  NVD neovascularization of the disc; NVE neovascularization elsewhere; AREDS age related eye disease study; ARMD age related macular degeneration; POAG primary open angle glaucoma; EBMD epithelial/anterior basement membrane dystrophy; ACIOL anterior chamber intraocular lens; IOL intraocular lens; PCIOL posterior chamber intraocular lens; Phaco/IOL phacoemulsification with intraocular lens placement; PRK photorefractive keratectomy; LASIK laser assisted in situ keratomileusis; HTN hypertension; DM diabetes mellitus; COPD chronic obstructive pulmonary disease

## 2022-08-15 ENCOUNTER — Encounter (INDEPENDENT_AMBULATORY_CARE_PROVIDER_SITE_OTHER): Payer: No Typology Code available for payment source | Admitting: Ophthalmology

## 2022-08-15 ENCOUNTER — Ambulatory Visit (INDEPENDENT_AMBULATORY_CARE_PROVIDER_SITE_OTHER): Payer: No Typology Code available for payment source | Admitting: Ophthalmology

## 2022-08-15 ENCOUNTER — Encounter (INDEPENDENT_AMBULATORY_CARE_PROVIDER_SITE_OTHER): Payer: Self-pay | Admitting: Ophthalmology

## 2022-08-15 DIAGNOSIS — E113513 Type 2 diabetes mellitus with proliferative diabetic retinopathy with macular edema, bilateral: Secondary | ICD-10-CM | POA: Diagnosis not present

## 2022-08-15 DIAGNOSIS — H35371 Puckering of macula, right eye: Secondary | ICD-10-CM

## 2022-08-15 DIAGNOSIS — H40051 Ocular hypertension, right eye: Secondary | ICD-10-CM

## 2022-08-15 DIAGNOSIS — H35033 Hypertensive retinopathy, bilateral: Secondary | ICD-10-CM

## 2022-08-15 DIAGNOSIS — H4311 Vitreous hemorrhage, right eye: Secondary | ICD-10-CM

## 2022-08-15 DIAGNOSIS — I10 Essential (primary) hypertension: Secondary | ICD-10-CM

## 2022-08-15 DIAGNOSIS — Z961 Presence of intraocular lens: Secondary | ICD-10-CM

## 2022-08-15 MED ORDER — BEVACIZUMAB CHEMO INJECTION 1.25MG/0.05ML SYRINGE FOR KALEIDOSCOPE
1.2500 mg | INTRAVITREAL | Status: AC | PRN
Start: 2022-08-15 — End: 2022-08-15
  Administered 2022-08-15: 1.25 mg via INTRAVITREAL

## 2022-08-22 NOTE — Progress Notes (Signed)
[facility] - Clinic Note  08/30/2022    CHIEF COMPLAINT Patient presents for Retina Follow Up   HISTORY OF PRESENT ILLNESS: Matthew Sellers is a 82 y.o. male who presents to the clinic today for:   HPI     Retina Follow Up   Patient presents with  Diabetic Retinopathy.  In both eyes.  This started weeks ago.  Duration of 2 weeks.  Since onset it is stable.  I, the attending physician,  performed the HPI with the patient and updated documentation appropriately.        Comments   Patient feels that the vision is about the same. He is using Brimonidine OD BID, Simbrinza OU BID, Xalatan OU QHS, and Timolol OU BID. His blood sugar was 112.      Last edited by Rennis Chris, MD on 08/30/2022 11:57 AM.    Pt here for PRP OS   Referring physician: No referring provider defined for this encounter.  HISTORICAL INFORMATION:   Selected notes from the MEDICAL RECORD NUMBER [facility]EE:  Ocular Hx- PMH   CURRENT MEDICATIONS: Current Outpatient Medications (Ophthalmic Drugs)  Medication Sig   brimonidine (ALPHAGAN) 0.2 % ophthalmic solution Place 1 drop into the right eye 2 (two) times daily.   Brinzolamide-Brimonidine 1-0.2 % SUSP Place 1 drop into both eyes 2 (two) times daily.   carboxymethylcellulose (REFRESH PLUS) 0.5 % SOLN INSTILL 1 DROP IN BOTH EYES FIVE TIMES A DAY   latanoprost (XALATAN) 0.005 % ophthalmic solution Place 1 drop into both eyes at bedtime.   Latanoprost 0.005 % EMUL    timolol (TIMOPTIC-XR) 0.5 % ophthalmic gel-forming SMARTSIG:In Eye(s)   No current facility-administered medications for this visit. (Ophthalmic Drugs)   Current Outpatient Medications (Other)  Medication Sig   amLODipine (NORVASC) 10 MG tablet Take 10 mg by mouth daily.   aspirin EC 81 MG tablet Take 81 mg by mouth daily.   cefpodoxime (VANTIN) 200 MG tablet    gabapentin (NEURONTIN) 100 MG capsule Take 2 capsules by mouth at bedtime.    insulin glargine (LANTUS) 100 UNIT/ML injection Inject 30 Units into the skin at bedtime.    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   Krill Oil 300 MG CAPS Take 300 mg by mouth daily.   lidocaine (LIDODERM) 5 % Place 1 patch onto the skin daily. Remove & Discard patch within 12 hours or as directed by MD   lisinopril (ZESTRIL) 20 MG tablet Take 2 tablets by mouth daily.   lisinopril-hydrochlorothiazide (ZESTORETIC) 20-25 MG tablet Take 2 tablets by mouth every morning.   metFORMIN (GLUCOPHAGE) 1000 MG tablet Take 1,000 mg by mouth 2 (two) times daily with a meal.   pravastatin (PRAVACHOL) 20 MG tablet Take 20 mg by mouth at bedtime.   PRECISION XTRA TEST STRIPS test strip    fluticasone (FLONASE) 50 MCG/ACT nasal spray Place 2 sprays into both nostrils daily as needed for allergies or rhinitis.   gabapentin (NEURONTIN) 100 MG capsule Take 1 capsule by mouth 3 (three) times daily.   HYDROcodone-acetaminophen (NORCO/VICODIN) 5-325 MG tablet Take 1-2 tablets by mouth every 4 (four) hours as needed for moderate pain.   nitroGLYCERIN (NITROSTAT) 0.4 MG SL tablet Place 0.4 mg under the tongue every 5 (five) minutes as needed for chest pain.   No  current facility-administered medications for this visit. (Other)   REVIEW OF SYSTEMS: ROS   Positive for: Genitourinary, Endocrine, Cardiovascular, Eyes Negative for: Constitutional, Gastrointestinal, Neurological, Skin, Musculoskeletal, HENT, Respiratory, Psychiatric, Allergic/Imm, Heme/Lymph Last edited by Julieanne Cotton, COT on 08/30/2022  9:35 AM.     ALLERGIES No Known Allergies  PAST MEDICAL HISTORY Past Medical History:  Diagnosis Date   Anemia    Bradycardia    CAD (coronary artery disease)    Chest tightness    Chronic kidney disease    Diabetes mellitus without complication (HCC)     Diabetic retinopathy (HCC)    Dyslipidemia    Dyspnea on exertion    Glaucoma    Hypertension    Obesity    Paroxysmal atrial fibrillation (HCC)    a. identified on device interrogation   Pneumonia    Past Surgical History:  Procedure Laterality Date   ACHILLES TENDON SURGERY     for rupture   CARDIAC CATHETERIZATION  07/08/2009   mild to mod . prox LAD 50% (Dr. Garen Lah)   CATARACT EXTRACTION     CHOLECYSTECTOMY N/A 10/11/2017   Procedure: LAPAROSCOPIC CHOLECYSTECTOMY WITH INTRAOPERATIVE CHOLANGIOGRAM;  Surgeon: Ovidio Kin, MD;  Location: [facility] OR;  Service: General;  Laterality: N/A;   DOPPLER ECHOCARDIOGRAPHY  04/10/2008   LVEF 70-80% ,norm   EYE SURGERY     PACEMAKER IMPLANT N/A 10/12/2017   Procedure: PACEMAKER IMPLANT;  Surgeon: Marinus Maw, MD;  Location: [facility] INVASIVE CV LAB;  Service: Cardiovascular;  Laterality: N/A;   stress myoview  04/09/2008   EF 54%; LV  norm   TEMPORARY PACEMAKER N/A 10/11/2017   Procedure: TEMPORARY PACEMAKER;  Surgeon: Marinus Maw, MD;  Location: [facility] CV [facility];  Service: Cardiovascular;  Laterality: N/A;   FAMILY HISTORY Family History  Problem Relation Age of Onset   Leukemia Mother    SOCIAL HISTORY Social History   Tobacco Use   Smoking status: Never   Smokeless tobacco: Never  Vaping Use   Vaping Use: Never used  Substance Use Topics   Alcohol use: No    Alcohol/week: 0.0 standard drinks of alcohol   Drug use: No       OPHTHALMIC EXAM:  Base Eye Exam     Visual Acuity (Snellen - Linear)       Right Left   Dist cc 20/20 20/25    Correction: Glasses         Tonometry (Tonopen, 9:39 AM)       Right Left   Pressure 15 19         Pupils       Dark Light Shape React APD   Right 2 1 Round Brisk None   Left 2 1 Round Brisk None         Visual Fields       Left Right    Full Full         Extraocular Movement       Right Left    Full, Ortho Full, Ortho         Neuro/Psych      Oriented x3: Yes   Mood/Affect: Normal         Dilation     Both eyes: 1.0% Mydriacyl, 2.5% Phenylephrine @ 9:35 AM           Slit Lamp and Fundus Exam     Slit Lamp Exam       Right Left   Lids/Lashes Dermatochalasis - upper  lid, mild MGD Dermatochalasis - upper lid, mild MGD   Conjunctiva/Sclera nasal pingeucula, Melanosis nasal pingeucula, Melanosis   Cornea 2-3+ Punctate epithelial erosions, well healed cataract wound 2-3+ Punctate epithelial erosions, well healed cataract wound   Anterior Chamber Deep and quiet Deep and quiet   Iris round and poorly dilated to 4mm, No NVI round and poorly dilated to 4mm, No NVI   Lens PC IOL in good position PC IOL in good position   Anterior Vitreous Mild Vitreous syneresis, Posterior vitreous detachment, blood stained vitreous condensations inferiorly -- clearing centrally and settling inferiorly Mild Vitreous syneresis, Posterior vitreous detachment, vitreous condensations, interval improvement in blood stained vitreous condensations         Fundus Exam       Right Left   Disc mild Pallor, Sharp rim, +cupping mild Pallor, Sharp rim, +cupping, focal PPP temporal, vascular loops, early NVD -- stably improved   C/D Ratio 0.6 0.5   Macula Flat, good foveal reflex, scattered Microaneurysms, trace cystic changes -- improved, RPE mottling and clumping Flat, good foveal reflex, rare MA/DBH, mild cystic changes temporal mac - slightly improved   Vessels attenuated, Tortuous attenuated, Tortuous, no obvious NV   Periphery Attached, +NV along inferior arcades and nasal midzone - improved, 360 MA/DBH, pre-retinal hemes settled inferiorly - improved, good 360 PRP changes -- room for temporal fill in, scattered fibrosis Attached, 360 MA/DBH, focal exudates nasal midzone, no PRP laser           Refraction     Wearing Rx       Sphere Cylinder Axis Add   Right -0.75 +1.25 004 +2.75   Left -0.75 +1.25 004 +2.75    Type: PAL            IMAGING AND PROCEDURES  Imaging and Procedures for 08/30/2022  OCT, Retina - OU - Both Eyes       Right Eye Quality was good. Central Foveal Thickness: 287. Progression has improved. Findings include no IRF, no SRF, abnormal foveal contour, epiretinal membrane, preretinal fibrosis (Persistent vitreous opacities settled inferiorly, pre-retinal fibrosis inferiorly -- stable, mild interval improvement in cystic changes temporal fovea and mac).   Left Eye Quality was borderline. Central Foveal Thickness: 261. Progression has improved. Findings include normal foveal contour, no SRF, intraretinal hyper-reflective material, intraretinal fluid (Interval improvement in IRF temporal macula, stable improvement in vitreous opacities).   Notes *Images captured and stored on drive  Diagnosis / Impression:  OD: Persistent vitreous opacities settled inferiorly, pre-retinal fibrosis inferiorly -- stable, mild interval improvement in cystic changes temporal fovea and mac OS: Interval improvement in IRF temporal macula, stable improvement in vitreous opacities  Clinical management:  See below  Abbreviations: NFP - Normal foveal profile. CME - cystoid macular edema. PED - pigment epithelial detachment. IRF - intraretinal fluid. SRF - subretinal fluid. EZ - ellipsoid zone. ERM - epiretinal membrane. ORA - outer retinal atrophy. ORT - outer retinal tubulation. SRHM - subretinal hyper-reflective material. IRHM - intraretinal hyper-reflective material      Panretinal Photocoagulation - OS - Left Eye       LASER PROCEDURE NOTE  Diagnosis:   Proliferative Diabetic Retinopathy, LEFT EYE  Procedure:  Pan-retinal photocoagulation using slit lamp laser, LEFT EYE  Anesthesia:  Topical  Surgeon: Rennis Chris, MD, PhD   Informed consent obtained, operative eye marked, and time out performed prior to initiation of laser.   Lumenis Smart532 slit lamp laser Pattern: 3x3 square Power: 260 mW Duration: 30  msec  Spot size: 200 microns  # spots: 1413 spots  Complications: None.  RTC: July 1 or later - DFE/OCT, possible injxns  Patient tolerated the procedure well and received written and verbal post-procedure care information/education.            ASSESSMENT/PLAN:    ICD-10-CM   1. Proliferative diabetic retinopathy of both eyes with macular edema associated with type 2 diabetes mellitus (HCC)  E11.3513 OCT, Retina - OU - Both Eyes    Panretinal Photocoagulation - OS - Left Eye    2. Current use of insulin (HCC)  Z79.4     3. Long term (current) use of oral hypoglycemic drugs  Z79.84     4. Vitreous hemorrhage, right eye (HCC)  H43.11     5. Epiretinal membrane (ERM) of right eye  H35.371     6. Essential hypertension  I10     7. Hypertensive retinopathy of both eyes  H35.033     8. Pseudophakia, both eyes  Z96.1     9. Ocular hypertension of right eye  H40.051       1-4. Proliferative diabetic retinopathy OU w/ vitreous hemorrhage, OD  - s/p IVA OD #1 (12.19.22), #2 (01.16.23), #3 (02.16.23), #4 (04.04.23), #5 (09.13.23), #6 (10.11.23), #7 (11.27.23), #8 (01.29.24), #9 (03.25.24), #10 (05.13.24)  - s/p IVA OS #1 (09.13.23), #2 (10.11.23), #3 (11.27.23), #4 (01.29.24), #5 (03.25.24), #6 (05.13.24)  - s/p PRP OD (02.27.23), (09.19.23)  - referred by [facility] -- formerly followed by Dr. Clarisa Kindred - BCVA OD: 20/25 (stable) ; 20/20 OS  - FA (09.05.23) OD: Perisistent vitreous opacities settled inferiorly inferiorly, pre-retinal fibrosis inferiorly -- stable, mild interval increase in cystic changes temporal fovea and mac OS: Mild NVD, vascular non-perfusion and perivascular leakage temporal periphery **pt would benefit from PRP OS** - OCT today shows OD: Persistent vitreous opacities settled inferiorly, pre-retinal fibrosis inferiorly -- stable, mild interval improvement in cystic changes temporal fovea and mac; OS: Interval improvement in IRF temporal macula, stable improvement in  vitreous opacities at 7 wks - recommend PRP OS today, 05.28.24 - pt wishes to proceed with laser - RBA of procedure discussed, questions answered - see procedure note  - start PF QID OS x7 days - IVA informed consent obtained and signed, 09.13.23 (OS) - VH precautions reviewed -- minimize activities, keep head elevated, avoid ASA/NSAIDs/blood thinners as able  - f/u July 1 or later, DFE, OCT, possible injection(s)  5. Epiretinal membrane, right eye  - mild ERM OD - BCVA 20/20 - stable - asymptomatic, no metamorphopsia - no indication for surgery at this time - continue to monitor  6,7. Hypertensive retinopathy OU - discussed importance of tight BP control - continue to monitor  8. Pseudophakia OU  - s/p CE/IOL OU  - IOLs in good position, doing well  - continue to monitor  9. Ocular Hypertension OD  - 15 OD on Brim BID OD  - continue brimonidine bid OD  Ophthalmic Meds Ordered this visit:  No orders of the defined types were placed in this encounter.    Return in about 1 month (around 10/03/2022) for July 1 or later -- PDR w/ DME, Dilated Exam, OCT, Possible Injxn.  There are no Patient Instructions on file for this visit.   Explained the diagnoses, plan, and follow up with the patient and they expressed understanding.  Patient expressed understanding of the importance of proper follow up care.   This document serves as a record of services personally performed by  Karie Chimera, MD, PhD. It was created on their behalf by Gerilyn Nestle, COT an ophthalmic technician. The creation of this record is the provider's dictation and/or activities during the visit.    Electronically signed by:  Gerilyn Nestle, COT  5.20.24 12:00 PM  Karie Chimera, M.D., Ph.D. Diseases & Surgery of the Retina and Vitreous [facility]  I have reviewed the above documentation for accuracy and completeness, and I agree with the above. Karie Chimera, M.D., Ph.D.  08/30/22 12:08 PM   Abbreviations: M myopia (nearsighted); A astigmatism; H hyperopia (farsighted); P presbyopia; Mrx spectacle prescription;  CTL contact lenses; OD right eye; OS left eye; OU both eyes  XT exotropia; ET esotropia; PEK punctate epithelial keratitis; PEE punctate epithelial erosions; DES dry eye syndrome; MGD meibomian gland dysfunction; ATs artificial tears; PFAT's preservative free artificial tears; NSC nuclear sclerotic cataract; PSC posterior subcapsular cataract; ERM epi-retinal membrane; PVD posterior vitreous detachment; RD retinal detachment; DM diabetes mellitus; DR diabetic retinopathy; NPDR non-proliferative diabetic retinopathy; PDR proliferative diabetic retinopathy; CSME clinically significant macular edema; DME diabetic macular edema; dbh dot blot hemorrhages; CWS cotton wool spot; POAG primary open angle glaucoma; C/D cup-to-disc ratio; HVF humphrey visual field; GVF goldmann visual field; OCT optical coherence tomography; IOP intraocular pressure; BRVO Branch retinal vein occlusion; CRVO central retinal vein occlusion; CRAO central retinal artery occlusion; BRAO branch retinal artery occlusion; RT retinal tear; SB scleral buckle; PPV pars plana vitrectomy; VH Vitreous hemorrhage; PRP panretinal laser photocoagulation; IVK intravitreal kenalog; VMT vitreomacular traction; MH Macular hole;  NVD neovascularization of the disc; NVE neovascularization elsewhere; AREDS age related eye disease study; ARMD age related macular degeneration; POAG primary open angle glaucoma; EBMD epithelial/anterior basement membrane dystrophy; ACIOL anterior chamber intraocular lens; IOL intraocular lens; PCIOL posterior chamber intraocular lens; Phaco/IOL phacoemulsification with intraocular lens placement; PRK photorefractive keratectomy; LASIK laser assisted in situ keratomileusis; HTN hypertension; DM diabetes mellitus; COPD chronic obstructive pulmonary disease

## 2022-08-30 ENCOUNTER — Encounter (INDEPENDENT_AMBULATORY_CARE_PROVIDER_SITE_OTHER): Payer: Self-pay | Admitting: Ophthalmology

## 2022-08-30 ENCOUNTER — Ambulatory Visit (INDEPENDENT_AMBULATORY_CARE_PROVIDER_SITE_OTHER): Payer: No Typology Code available for payment source | Admitting: Ophthalmology

## 2022-08-30 DIAGNOSIS — Z961 Presence of intraocular lens: Secondary | ICD-10-CM

## 2022-08-30 DIAGNOSIS — H35371 Puckering of macula, right eye: Secondary | ICD-10-CM

## 2022-08-30 DIAGNOSIS — Z7984 Long term (current) use of oral hypoglycemic drugs: Secondary | ICD-10-CM

## 2022-08-30 DIAGNOSIS — E113513 Type 2 diabetes mellitus with proliferative diabetic retinopathy with macular edema, bilateral: Secondary | ICD-10-CM | POA: Diagnosis not present

## 2022-08-30 DIAGNOSIS — Z794 Long term (current) use of insulin: Secondary | ICD-10-CM

## 2022-08-30 DIAGNOSIS — H35033 Hypertensive retinopathy, bilateral: Secondary | ICD-10-CM

## 2022-08-30 DIAGNOSIS — H4311 Vitreous hemorrhage, right eye: Secondary | ICD-10-CM

## 2022-08-30 DIAGNOSIS — I10 Essential (primary) hypertension: Secondary | ICD-10-CM

## 2022-08-30 DIAGNOSIS — H40051 Ocular hypertension, right eye: Secondary | ICD-10-CM

## 2022-08-30 MED ORDER — PREDNISOLONE ACETATE 1 % OP SUSP: 1.0000 [drp] | Freq: Four times a day (QID) | OPHTHALMIC | 0 refills | Status: AC

## 2022-08-30 NOTE — Addendum Note (Signed)
Addended by: Karie Chimera on: 08/30/2022 12:13 PM   Modules accepted: Orders

## 2022-10-04 ENCOUNTER — Ambulatory Visit (INDEPENDENT_AMBULATORY_CARE_PROVIDER_SITE_OTHER): Payer: No Typology Code available for payment source | Admitting: Ophthalmology

## 2022-10-04 ENCOUNTER — Encounter (INDEPENDENT_AMBULATORY_CARE_PROVIDER_SITE_OTHER): Payer: Self-pay | Admitting: Ophthalmology

## 2022-10-04 DIAGNOSIS — H4311 Vitreous hemorrhage, right eye: Secondary | ICD-10-CM | POA: Diagnosis not present

## 2022-10-04 DIAGNOSIS — E113513 Type 2 diabetes mellitus with proliferative diabetic retinopathy with macular edema, bilateral: Secondary | ICD-10-CM

## 2022-10-04 DIAGNOSIS — H35033 Hypertensive retinopathy, bilateral: Secondary | ICD-10-CM

## 2022-10-04 DIAGNOSIS — I10 Essential (primary) hypertension: Secondary | ICD-10-CM

## 2022-10-04 DIAGNOSIS — Z794 Long term (current) use of insulin: Secondary | ICD-10-CM

## 2022-10-04 DIAGNOSIS — Z7984 Long term (current) use of oral hypoglycemic drugs: Secondary | ICD-10-CM

## 2022-10-04 DIAGNOSIS — H40051 Ocular hypertension, right eye: Secondary | ICD-10-CM

## 2022-10-04 DIAGNOSIS — Z961 Presence of intraocular lens: Secondary | ICD-10-CM

## 2022-10-04 DIAGNOSIS — H35371 Puckering of macula, right eye: Secondary | ICD-10-CM

## 2022-10-04 MED ORDER — BEVACIZUMAB CHEMO INJECTION 1.25MG/0.05ML SYRINGE FOR KALEIDOSCOPE
1.2500 mg | INTRAVITREAL | Status: AC | PRN
Start: 2022-10-04 — End: 2022-10-04
  Administered 2022-10-04: 1.25 mg via INTRAVITREAL

## 2022-10-04 NOTE — Progress Notes (Signed)
[facility] - Clinic Note  10/04/2022    CHIEF COMPLAINT Patient presents for Retina Follow Up   HISTORY OF PRESENT ILLNESS: Matthew Sellers is a 82 y.o. male who presents to the clinic today for:   HPI     Retina Follow Up   Patient presents with  Diabetic Retinopathy.  In both eyes.  This started 5 weeks ago.  Duration of 5 weeks.  Since onset it is stable.  I, the attending physician,  performed the HPI with the patient and updated documentation appropriately.        Comments   5 week PDR OU and IVA OU pt is reporting no vision changes noticed he denies any flashes or floaters he is using gel for dryness and brim OU bid latanoprost at bedtime OU  and simbrinzia bid OU last reading was 91 today       Last edited by Rennis Chris, MD on 10/04/2022 12:21 PM.    Pt reports no issues after PRP OS   Referring physician: No referring provider defined for this encounter.  HISTORICAL INFORMATION:   Selected notes from the MEDICAL RECORD NUMBER [facility] LEE:  Ocular Hx- PMH   CURRENT MEDICATIONS: Current Outpatient Medications (Ophthalmic Drugs)  Medication Sig   brimonidine (ALPHAGAN) 0.2 % ophthalmic solution Place 1 drop into the right eye 2 (two) times daily.   Brinzolamide-Brimonidine 1-0.2 % SUSP Place 1 drop into both eyes 2 (two) times daily.   carboxymethylcellulose (REFRESH PLUS) 0.5 % SOLN INSTILL 1 DROP IN BOTH EYES FIVE TIMES A DAY   latanoprost (XALATAN) 0.005 % ophthalmic solution Place 1 drop into both eyes at bedtime.   Latanoprost 0.005 % EMUL    timolol (TIMOPTIC-XR) 0.5 % ophthalmic gel-forming SMARTSIG:In Eye(s)   No current facility-administered medications for this visit. (Ophthalmic Drugs)   Current Outpatient Medications (Other)  Medication Sig   amLODipine (NORVASC) 10 MG tablet Take 10 mg by mouth daily.   aspirin EC 81 MG tablet Take 81 mg by mouth daily.   cefpodoxime (VANTIN) 200 MG tablet     fluticasone (FLONASE) 50 MCG/ACT nasal spray Place 2 sprays into both nostrils daily as needed for allergies or rhinitis.   gabapentin (NEURONTIN) 100 MG capsule Take 1 capsule by mouth 3 (three) times daily.   gabapentin (NEURONTIN) 100 MG capsule Take 2 capsules by mouth at bedtime.   HYDROcodone-acetaminophen (NORCO/VICODIN) 5-325 MG tablet Take 1-2 tablets by mouth every 4 (four) hours as needed for moderate pain.   insulin glargine (LANTUS) 100 UNIT/ML injection Inject 30 Units into the skin at bedtime.    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   Krill Oil 300 MG CAPS Take 300 mg by mouth daily.   lidocaine (LIDODERM) 5 % Place 1 patch onto the skin daily. Remove & Discard patch within 12 hours or as directed by MD   lisinopril (ZESTRIL) 20 MG tablet Take 2 tablets by mouth daily.   lisinopril-hydrochlorothiazide (ZESTORETIC) 20-25 MG tablet Take 2 tablets by mouth every morning.   metFORMIN (GLUCOPHAGE) 1000 MG tablet Take 1,000 mg by mouth 2 (two) times daily with a meal.   nitroGLYCERIN (NITROSTAT) 0.4 MG SL tablet Place 0.4 mg under the tongue every 5 (five) minutes as needed for chest pain.   pravastatin (PRAVACHOL) 20 MG tablet Take  20 mg by mouth at bedtime.   PRECISION XTRA TEST STRIPS test strip    No current facility-administered medications for this visit. (Other)   REVIEW OF SYSTEMS: ROS   Positive for: Genitourinary, Endocrine, Cardiovascular, Eyes Negative for: Constitutional, Gastrointestinal, Neurological, Skin, Musculoskeletal, HENT, Respiratory, Psychiatric, Allergic/Imm, Heme/Lymph Last edited by Etheleen Mayhew, COT on 10/04/2022  9:05 AM.     ALLERGIES No Known Allergies  PAST MEDICAL HISTORY Past Medical History:  Diagnosis Date   Anemia    Bradycardia    CAD (coronary artery disease)    Chest tightness     Chronic kidney disease    Diabetes mellitus without complication (HCC)    Diabetic retinopathy (HCC)    Dyslipidemia    Dyspnea on exertion    Glaucoma    Hypertension    Obesity    Paroxysmal atrial fibrillation (HCC)    a. identified on device interrogation   Pneumonia    Past Surgical History:  Procedure Laterality Date   ACHILLES TENDON SURGERY     for rupture   CARDIAC CATHETERIZATION  07/08/2009   mild to mod . prox LAD 50% (Dr. Garen Lah)   CATARACT EXTRACTION     CHOLECYSTECTOMY N/A 10/11/2017   Procedure: LAPAROSCOPIC CHOLECYSTECTOMY WITH INTRAOPERATIVE CHOLANGIOGRAM;  Surgeon: Ovidio Kin, MD;  Location: [facility] OR;  Service: General;  Laterality: N/A;   DOPPLER ECHOCARDIOGRAPHY  04/10/2008   LVEF 70-80% ,norm   EYE SURGERY     PACEMAKER IMPLANT N/A 10/12/2017   Procedure: PACEMAKER IMPLANT;  Surgeon: Marinus Maw, MD;  Location: [facility] INVASIVE CV LAB;  Service: Cardiovascular;  Laterality: N/A;   stress myoview  04/09/2008   EF 54%; LV  norm   TEMPORARY PACEMAKER N/A 10/11/2017   Procedure: TEMPORARY PACEMAKER;  Surgeon: Marinus Maw, MD;  Location: [facility] INVASIVE CV LAB;  Service: Cardiovascular;  Laterality: N/A;   FAMILY HISTORY Family History  Problem Relation Age of Onset   Leukemia Mother    SOCIAL HISTORY Social History   Tobacco Use   Smoking status: Never   Smokeless tobacco: Never  Vaping Use   Vaping Use: Never used  Substance Use Topics   Alcohol use: No    Alcohol/week: 0.0 standard drinks of alcohol   Drug use: No       OPHTHALMIC EXAM:  Base Eye Exam     Visual Acuity (Snellen - Linear)       Right Left   Dist cc 20/20 20/25 -1   Dist ph cc  NI    Correction: Glasses         Tonometry (Tonopen, 9:10 AM)       Right Left   Pressure 21 25         Pupils       Pupils Dark Light Shape React APD   Right PERRL 2 1 Round Brisk None   Left PERRL 2 1 Round Brisk None         Visual Fields       Left Right    Full Full          Neuro/Psych     Oriented x3: Yes   Mood/Affect: Normal         Dilation     Both eyes: 2.5% Phenylephrine @ 9:10 AM           Slit Lamp and Fundus Exam     Slit Lamp Exam       Right Left   Lids/Lashes Dermatochalasis -  upper lid, mild MGD Dermatochalasis - upper lid, mild MGD   Conjunctiva/Sclera nasal pingeucula, Melanosis nasal pingeucula, Melanosis   Cornea 2-3+ Punctate epithelial erosions, well healed cataract wound mild arcus, well healed cataract wound   Anterior Chamber deep and clear deep and clear   Iris round and moderately dilated, No NVI round and moderdilated to 4mm, No NVI   Lens PC IOL in good position PC IOL in good position   Anterior Vitreous Mild Vitreous syneresis, Posterior vitreous detachment, blood stained vitreous condensations inferiorly -- clearing centrally Mild Vitreous syneresis, Posterior vitreous detachment, vitreous condensations, interval improvement in blood stained vitreous condensations         Fundus Exam       Right Left   Disc 2+Pallor, Sharp rim, +cupping mild Pallor, Sharp rim, +cupping, focal PPP temporal, vascular loops, early NVD -- stably improved   C/D Ratio 0.7 0.5   Macula Flat, good foveal reflex, scattered Microaneurysms, trace cystic changes -- improved, RPE mottling and clumping Flat, good foveal reflex, rare MA/DBH, mild cystic changes temporal mac   Vessels attenuated, Tortuous attenuated, Tortuous, no obvious NV   Periphery Attached, +NV along inferior arcades and nasal midzone - improved, 360 MA/DBH, pre-retinal hemes settled inferiorly - improved, good 360 PRP changes -- with good fill in, scattered fibrosis Attached, 360 MA/DBH, focal exudates nasal midzone, early PRP laser changes 360           Refraction     Wearing Rx       Sphere Cylinder Axis Add   Right -0.75 +1.25 004 +2.75   Left -0.75 +1.25 004 +2.75    Type: PAL           IMAGING AND PROCEDURES  Imaging and Procedures for  10/04/2022  OCT, Retina - OU - Both Eyes       Right Eye Quality was good. Central Foveal Thickness: 284. Progression has improved. Findings include no IRF, no SRF, abnormal foveal contour, epiretinal membrane, preretinal fibrosis (Persistent vitreous opacities settled inferiorly, pre-retinal fibrosis inferiorly -- stable, mild interval improvement in cystic changes temporal fovea and mac).   Left Eye Quality was borderline. Central Foveal Thickness: 268. Progression has been stable. Findings include normal foveal contour, no SRF, intraretinal hyper-reflective material, intraretinal fluid (Mild persistent IRF temporal macula and vitreous opacities).   Notes *Images captured and stored on drive  Diagnosis / Impression:  OD: Persistent vitreous opacities settled inferiorly, pre-retinal fibrosis inferiorly -- stable, mild interval improvement in cystic changes temporal fovea and mac OS: Mild persistent IRF temporal macula and vitreous opacities  Clinical management:  See below  Abbreviations: NFP - Normal foveal profile. CME - cystoid macular edema. PED - pigment epithelial detachment. IRF - intraretinal fluid. SRF - subretinal fluid. EZ - ellipsoid zone. ERM - epiretinal membrane. ORA - outer retinal atrophy. ORT - outer retinal tubulation. SRHM - subretinal hyper-reflective material. IRHM - intraretinal hyper-reflective material      Intravitreal Injection, Pharmacologic Agent - OD - Right Eye       Time Out 10/04/2022. 9:52 AM. Confirmed correct patient, procedure, site, and patient consented.   Anesthesia Topical anesthesia was used. Anesthetic medications included Lidocaine 2%, Proparacaine 0.5%.   Procedure Preparation included 5% betadine to ocular surface, eyelid speculum. A supplied (32g) needle was used.   Injection: 1.25 mg Bevacizumab 1.25mg /0.55ml   Route: Intravitreal, Site: Right Eye   NDC: P3213405, Lot: [id number], Expiration date: 11/14/2022   Post-op Post  injection exam found visual acuity of at  least counting fingers. The patient tolerated the procedure well. There were no complications. The patient received written and verbal post procedure care education. Post injection medications were not given.      Intravitreal Injection, Pharmacologic Agent - OS - Left Eye       Time Out 10/04/2022. 9:53 AM. Confirmed correct patient, procedure, site, and patient consented.   Anesthesia Topical anesthesia was used. Anesthetic medications included Lidocaine 2%, Proparacaine 0.5%.   Procedure Preparation included 5% betadine to ocular surface, eyelid speculum. A (32g) needle was used.   Injection: 1.25 mg Bevacizumab 1.25mg /0.34ml   Route: Intravitreal, Site: Left Eye   NDC: P3213405, Lot: [id number], Expiration date: 12/30/2022   Post-op Post injection exam found visual acuity of at least counting fingers. The patient tolerated the procedure well. There were no complications. The patient received written and verbal post procedure care education. Post injection medications were not given.            ASSESSMENT/PLAN:    ICD-10-CM   1. Proliferative diabetic retinopathy of both eyes with macular edema associated with type 2 diabetes mellitus (HCC)  E11.3513 OCT, Retina - OU - Both Eyes    Intravitreal Injection, Pharmacologic Agent - OD - Right Eye    Intravitreal Injection, Pharmacologic Agent - OS - Left Eye    Bevacizumab (AVASTIN) SOLN 1.25 mg    Bevacizumab (AVASTIN) SOLN 1.25 mg    2. Current use of insulin (HCC)  Z79.4     3. Long term (current) use of oral hypoglycemic drugs  Z79.84     4. Vitreous hemorrhage, right eye (HCC)  H43.11     5. Epiretinal membrane (ERM) of right eye  H35.371     6. Essential hypertension  I10     7. Hypertensive retinopathy of both eyes  H35.033     8. Pseudophakia, both eyes  Z96.1     9. Ocular hypertension of right eye  H40.051      1-4. Proliferative diabetic retinopathy OU w/ vitreous  hemorrhage, OD  - s/p IVA OD #1 (12.19.22), #2 (01.16.23), #3 (02.16.23), #4 (04.04.23), #5 (09.13.23), #6 (10.11.23), #7 (11.27.23), #8 (01.29.24), #9 (03.25.24), #10 (05.13.24)  - s/p IVA OS #1 (09.13.23), #2 (10.11.23), #3 (11.27.23), #4 (01.29.24), #5 (03.25.24), #6 (05.13.24)  - s/p PRP OD (02.27.23), (09.19.23)  - s/p PRP OS (05.28.24)  - referred by [facility] -- formerly followed by Dr. Clarisa Kindred - BCVA OD: 20/20; 20/25 OS -- stable - FA (09.05.23) OD: Perisistent vitreous opacities settled inferiorly inferiorly, pre-retinal fibrosis inferiorly -- stable, mild interval increase in cystic changes temporal fovea and mac OS: Mild NVD, vascular non-perfusion and perivascular leakage temporal periphery - OCT today shows OD: Persistent vitreous opacities settled inferiorly, pre-retinal fibrosis inferiorly -- stable, mild interval improvement in cystic changes temporal fovea and mac; OS: Mild persistent IRF temporal macula and vitreous opacities at 7 wks - recommend IVA OU (OD #11 and OS #7) today, 07.02.24 with follow up in 7 weeks again - pt wishes to proceed with injections - RBA of procedure discussed, questions answered - see procedure note  - IVA informed consent obtained and signed, 09.13.23 (OS) - VH precautions reviewed -- minimize activities, keep head elevated, avoid ASA/NSAIDs/blood thinners as able  - f/u 7 weeks, DFE, OCT, possible injection(s)  5. Epiretinal membrane, right eye  - mild ERM OD - BCVA 20/20 - stable - asymptomatic, no metamorphopsia - no indication for surgery at this time - continue to monitor  6,7. Hypertensive  retinopathy OU - discussed importance of tight BP control - continue to monitor  8. Pseudophakia OU  - s/p CE/IOL OU  - IOLs in good position, doing well  - continue to monitor  9. Ocular Hypertension OU  - IOP 21,25 on Brim BID OD  - continue brimonidine bid OD  Ophthalmic Meds Ordered this visit:  Meds ordered this encounter  Medications    Bevacizumab (AVASTIN) SOLN 1.25 mg   Bevacizumab (AVASTIN) SOLN 1.25 mg     Return in about 7 weeks (around 11/22/2022) for f/u PDR OU, DFE, OCT.  There are no Patient Instructions on file for this visit.   Explained the diagnoses, plan, and follow up with the patient and they expressed understanding.  Patient expressed understanding of the importance of proper follow up care.   This document serves as a record of services personally performed by Karie Chimera, MD, PhD. It was created on their behalf by Glee Arvin. Manson Passey, OA an ophthalmic technician. The creation of this record is the provider's dictation and/or activities during the visit.    Electronically signed by: Glee Arvin. Manson Passey, OA 10/04/22 1:29 PM  Karie Chimera, M.D., Ph.D. Diseases & Surgery of the Retina and Vitreous [facility]  I have reviewed the above documentation for accuracy and completeness, and I agree with the above. Karie Chimera, M.D., Ph.D. 10/04/22 1:31 PM  Abbreviations: M myopia (nearsighted); A astigmatism; H hyperopia (farsighted); P presbyopia; Mrx spectacle prescription;  CTL contact lenses; OD right eye; OS left eye; OU both eyes  XT exotropia; ET esotropia; PEK punctate epithelial keratitis; PEE punctate epithelial erosions; DES dry eye syndrome; MGD meibomian gland dysfunction; ATs artificial tears; PFAT's preservative free artificial tears; NSC nuclear sclerotic cataract; PSC posterior subcapsular cataract; ERM epi-retinal membrane; PVD posterior vitreous detachment; RD retinal detachment; DM diabetes mellitus; DR diabetic retinopathy; NPDR non-proliferative diabetic retinopathy; PDR proliferative diabetic retinopathy; CSME clinically significant macular edema; DME diabetic macular edema; dbh dot blot hemorrhages; CWS cotton wool spot; POAG primary open angle glaucoma; C/D cup-to-disc ratio; HVF humphrey visual field; GVF goldmann visual field; OCT optical coherence tomography; IOP  intraocular pressure; BRVO Branch retinal vein occlusion; CRVO central retinal vein occlusion; CRAO central retinal artery occlusion; BRAO branch retinal artery occlusion; RT retinal tear; SB scleral buckle; PPV pars plana vitrectomy; VH Vitreous hemorrhage; PRP panretinal laser photocoagulation; IVK intravitreal kenalog; VMT vitreomacular traction; MH Macular hole;  NVD neovascularization of the disc; NVE neovascularization elsewhere; AREDS age related eye disease study; ARMD age related macular degeneration; POAG primary open angle glaucoma; EBMD epithelial/anterior basement membrane dystrophy; ACIOL anterior chamber intraocular lens; IOL intraocular lens; PCIOL posterior chamber intraocular lens; Phaco/IOL phacoemulsification with intraocular lens placement; PRK photorefractive keratectomy; LASIK laser assisted in situ keratomileusis; HTN hypertension; DM diabetes mellitus; COPD chronic obstructive pulmonary disease

## 2022-11-08 NOTE — Progress Notes (Signed)
[facility] - Clinic Note  11/22/2022    CHIEF COMPLAINT Patient presents for Retina Follow Up   HISTORY OF PRESENT ILLNESS: Matthew Sellers is a 82 y.o. male who presents to the clinic today for:   HPI     Retina Follow Up   In both eyes.  This started 7 weeks ago.  Duration of 7 weeks.  Since onset it is stable.  I, the attending physician,  performed the HPI with the patient and updated documentation appropriately.        Comments   7 week retina follow up PDR OU and IVA OU pt is reporting no vision changes noticed his last reading was 112 he denies any flashes or floaters he is using a gel for dry eyes pt has COAG using latanoprost ou at bedtime brimonidine bid OD timolol bid ou       Last edited by Rennis Chris, MD on 11/22/2022  5:04 PM.    Pt states vision is "pretty good"   Referring physician: No referring provider defined for this encounter.  HISTORICAL INFORMATION:   Selected notes from the MEDICAL RECORD NUMBER [facility]EE:  Ocular Hx- PMH   CURRENT MEDICATIONS: Current Outpatient Medications (Ophthalmic Drugs)  Medication Sig   brimonidine (ALPHAGAN) 0.2 % ophthalmic solution Place 1 drop into the right eye 2 (two) times daily.   Brinzolamide-Brimonidine 1-0.2 % SUSP Place 1 drop into both eyes 2 (two) times daily.   carboxymethylcellulose (REFRESH PLUS) 0.5 % SOLN INSTILL 1 DROP IN BOTH EYES FIVE TIMES A DAY   latanoprost (XALATAN) 0.005 % ophthalmic solution Place 1 drop into both eyes at bedtime.   Latanoprost 0.005 % EMUL    timolol (TIMOPTIC-XR) 0.5 % ophthalmic gel-forming SMARTSIG:In Eye(s)   No current facility-administered medications for this visit. (Ophthalmic Drugs)   Current Outpatient Medications (Other)  Medication Sig   amLODipine (NORVASC) 10 MG tablet Take 10 mg by mouth daily.   aspirin EC 81 MG tablet Take 81 mg by mouth daily.   cefpodoxime (VANTIN) 200 MG tablet    fluticasone  (FLONASE) 50 MCG/ACT nasal spray Place 2 sprays into both nostrils daily as needed for allergies or rhinitis.   gabapentin (NEURONTIN) 100 MG capsule Take 1 capsule by mouth 3 (three) times daily.   gabapentin (NEURONTIN) 100 MG capsule Take 2 capsules by mouth at bedtime.   HYDROcodone-acetaminophen (NORCO/VICODIN) 5-325 MG tablet Take 1-2 tablets by mouth every 4 (four) hours as needed for moderate pain.   insulin glargine (LANTUS) 100 UNIT/ML injection Inject 30 Units into the skin at bedtime.    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   Krill Oil 300 MG CAPS Take 300 mg by mouth daily.   lidocaine (LIDODERM) 5 % Place 1 patch onto the skin daily. Remove & Discard patch within 12 hours or as directed by MD   lisinopril (ZESTRIL) 20 MG tablet Take 2 tablets by mouth daily.   lisinopril-hydrochlorothiazide (ZESTORETIC) 20-25 MG tablet Take 2 tablets by mouth every morning.   metFORMIN (GLUCOPHAGE) 1000 MG tablet Take 1,000 mg by mouth 2 (two) times daily with a meal.   nitroGLYCERIN (NITROSTAT) 0.4 MG SL tablet Place 0.4 mg under the tongue every 5 (five) minutes as needed for chest pain.   pravastatin (PRAVACHOL) 20 MG tablet Take 20  mg by mouth at bedtime.   PRECISION XTRA TEST STRIPS test strip    No current facility-administered medications for this visit. (Other)   REVIEW OF SYSTEMS: ROS   Positive for: Genitourinary, Endocrine, Cardiovascular, Eyes Negative for: Constitutional, Gastrointestinal, Neurological, Skin, Musculoskeletal, HENT, Respiratory, Psychiatric, Allergic/Imm, Heme/Lymph Last edited by Etheleen Mayhew, COT on 11/22/2022  9:18 AM.     ALLERGIES No Known Allergies  PAST MEDICAL HISTORY Past Medical History:  Diagnosis Date   Anemia    Bradycardia    CAD (coronary artery disease)    Chest tightness    Chronic kidney  disease    Diabetes mellitus without complication (HCC)    Diabetic retinopathy (HCC)    Dyslipidemia    Dyspnea on exertion    Glaucoma    Hypertension    Obesity    Paroxysmal atrial fibrillation (HCC)    a. identified on device interrogation   Pneumonia    Past Surgical History:  Procedure Laterality Date   ACHILLES TENDON SURGERY     for rupture   CARDIAC CATHETERIZATION  07/08/2009   mild to mod . prox LAD 50% (Dr. Garen Lah)   CATARACT EXTRACTION     CHOLECYSTECTOMY N/A 10/11/2017   Procedure: LAPAROSCOPIC CHOLECYSTECTOMY WITH INTRAOPERATIVE CHOLANGIOGRAM;  Surgeon: Ovidio Kin, MD;  Location: [facility] OR;  Service: General;  Laterality: N/A;   DOPPLER ECHOCARDIOGRAPHY  04/10/2008   LVEF 70-80% ,norm   EYE SURGERY     PACEMAKER IMPLANT N/A 10/12/2017   Procedure: PACEMAKER IMPLANT;  Surgeon: Marinus Maw, MD;  Location: [facility] INVASIVE CV LAB;  Service: Cardiovascular;  Laterality: N/A;   stress myoview  04/09/2008   EF 54%; LV  norm   TEMPORARY PACEMAKER N/A 10/11/2017   Procedure: TEMPORARY PACEMAKER;  Surgeon: Marinus Maw, MD;  Location: [facility] INVASIVE CV LAB;  Service: Cardiovascular;  Laterality: N/A;   FAMILY HISTORY Family History  Problem Relation Age of Onset   Leukemia Mother    SOCIAL HISTORY Social History   Tobacco Use   Smoking status: Never   Smokeless tobacco: Never  Vaping Use   Vaping status: Never Used  Substance Use Topics   Alcohol use: No    Alcohol/week: 0.0 standard drinks of alcohol   Drug use: No       OPHTHALMIC EXAM:  Base Eye Exam     Visual Acuity (Snellen - Linear)       Right Left   Dist cc 20/20 -2 20/25   Dist ph cc  NI    Correction: Glasses         Tonometry (Tonopen, 9:24 AM)       Right Left   Pressure 19 18         Pupils       Pupils Dark Light Shape React APD   Right PERRL 2 1 Round Brisk None   Left PERRL 2 1 Round Brisk None         Visual Fields       Left Right    Full Full          Extraocular Movement       Right Left    Full, Ortho Full, Ortho         Neuro/Psych     Oriented x3: Yes   Mood/Affect: Normal         Dilation     Both eyes: 2.5% Phenylephrine @ 9:23 AM  Slit Lamp and Fundus Exam     Slit Lamp Exam       Right Left   Lids/Lashes Dermatochalasis - upper lid, mild MGD Dermatochalasis - upper lid, mild MGD   Conjunctiva/Sclera nasal pingeucula, Melanosis nasal pingeucula, Melanosis   Cornea 2-3+ Punctate epithelial erosions, well healed cataract wound mild arcus, well healed cataract wound   Anterior Chamber deep and clear deep and clear   Iris round and moderately dilated, No NVI round and moderdilated to 4mm, No NVI   Lens PC IOL in good position PC IOL in good position   Anterior Vitreous Mild Vitreous syneresis, Posterior vitreous detachment, blood stained vitreous condensations inferiorly -- clearing centrally Mild Vitreous syneresis, Posterior vitreous detachment, vitreous condensations, interval improvement in blood stained vitreous condensations         Fundus Exam       Right Left   Disc 2+Pallor, Sharp rim, +cupping mild Pallor, Sharp rim, +cupping, focal PPP temporal, vascular loops, early NVD -- stably improved   C/D Ratio 0.7 0.5   Macula Flat, good foveal reflex, scattered Microaneurysms, trace cystic changes -- improved, RPE mottling and clumping Flat, good foveal reflex, rare MA/DBH, mild cystic changes temporal mac   Vessels attenuated, mild tortuosity attenuated, Tortuous, no obvious NV   Periphery Attached, +NV along inferior arcades and nasal midzone - improved, 360 MA/DBH, pre-retinal hemes settled inferiorly - improved, good 360 PRP changes -- with good fill in, scattered fibrosis, ?shallow schisis nasal periphery Attached, 360 MA/DBH, focal exudates nasal midzone, mild PRP laser changes 360           Refraction     Wearing Rx       Sphere Cylinder Axis Add   Right -0.75 +1.25 004 +2.75    Left -0.75 +1.25 004 +2.75    Type: PAL           IMAGING AND PROCEDURES  Imaging and Procedures for 11/22/2022  OCT, Retina - OU - Both Eyes       Right Eye Quality was good. Central Foveal Thickness: 281. Progression has improved. Findings include no IRF, no SRF, abnormal foveal contour, epiretinal membrane, preretinal fibrosis (Persistent vitreous opacities settled inferiorly, pre-retinal fibrosis inferiorly -- stable, mild interval improvement in cystic changes temporal fovea and mac).   Left Eye Quality was borderline. Central Foveal Thickness: 269. Progression has improved. Findings include normal foveal contour, no SRF, intraretinal hyper-reflective material, intraretinal fluid (Mild interval improvement in IRF / IRHM temporal macula, interval improvement in vitreous opacities).   Notes *Images captured and stored on drive  Diagnosis / Impression:  OD: Persistent vitreous opacities settled inferiorly, pre-retinal fibrosis inferiorly -- stable, mild interval improvement in cystic changes temporal fovea and mac OS: Mild interval improvement in IRF / IRHM temporal macula, interval improvement in vitreous opacities  Clinical management:  See below  Abbreviations: NFP - Normal foveal profile. CME - cystoid macular edema. PED - pigment epithelial detachment. IRF - intraretinal fluid. SRF - subretinal fluid. EZ - ellipsoid zone. ERM - epiretinal membrane. ORA - outer retinal atrophy. ORT - outer retinal tubulation. SRHM - subretinal hyper-reflective material. IRHM - intraretinal hyper-reflective material      Intravitreal Injection, Pharmacologic Agent - OD - Right Eye       Time Out 11/22/2022. 9:38 AM. Confirmed correct patient, procedure, site, and patient consented.   Anesthesia Topical anesthesia was used. Anesthetic medications included Lidocaine 2%, Proparacaine 0.5%.   Procedure Preparation included 5% betadine to ocular surface, eyelid speculum.  A supplied (32g)  needle was used.   Injection: 1.25 mg Bevacizumab 1.25mg /0.80ml   Route: Intravitreal, Site: Right Eye   NDC: P3213405, Lot: [id number] , Expiration date: 12/17/2022   Post-op Post injection exam found visual acuity of at least counting fingers. The patient tolerated the procedure well. There were no complications. The patient received written and verbal post procedure care education. Post injection medications were not given.      Intravitreal Injection, Pharmacologic Agent - OS - Left Eye       Time Out 11/22/2022. 9:39 AM. Confirmed correct patient, procedure, site, and patient consented.   Anesthesia Topical anesthesia was used. Anesthetic medications included Lidocaine 2%, Proparacaine 0.5%.   Procedure Preparation included 5% betadine to ocular surface, eyelid speculum. A supplied (32g) needle was used.   Injection: 1.25 mg Bevacizumab 1.25mg /0.81ml   Route: Intravitreal, Site: Left Eye   NDC: P3213405, Lot: [id number], Expiration date: 12/18/2022   Post-op Post injection exam found visual acuity of at least counting fingers. The patient tolerated the procedure well. There were no complications. The patient received written and verbal post procedure care education. Post injection medications were not given.            ASSESSMENT/PLAN:    ICD-10-CM   1. Proliferative diabetic retinopathy of both eyes with macular edema associated with type 2 diabetes mellitus (HCC)  E11.3513 OCT, Retina - OU - Both Eyes    Intravitreal Injection, Pharmacologic Agent - OD - Right Eye    Intravitreal Injection, Pharmacologic Agent - OS - Left Eye    Bevacizumab (AVASTIN) SOLN 1.25 mg    Bevacizumab (AVASTIN) SOLN 1.25 mg    2. Current use of insulin (HCC)  Z79.4     3. Long term (current) use of oral hypoglycemic drugs  Z79.84     4. Vitreous hemorrhage, right eye (HCC)  H43.11     5. Epiretinal membrane (ERM) of right eye  H35.371     6. Essential hypertension  I10     7.  Hypertensive retinopathy of both eyes  H35.033     8. Pseudophakia, both eyes  Z96.1     9. Ocular hypertension of right eye  H40.051      1-4. Proliferative diabetic retinopathy OU w/ vitreous hemorrhage, OD  - s/p IVA OD #1 (12.19.22), #2 (01.16.23), #3 (02.16.23), #4 (04.04.23), #5 (09.13.23), #6 (10.11.23), #7 (11.27.23), #8 (01.29.24), #9 (03.25.24), #10 (05.13.24), #11 (07.02.24)  - s/p IVA OS #1 (09.13.23), #2 (10.11.23), #3 (11.27.23), #4 (01.29.24), #5 (03.25.24), #6 (05.13.24), #7 (07.02.24)  - s/p PRP OD (02.27.23), (09.19.23)  - s/p PRP OS (05.28.24)  - referred by [facility] -- formerly followed by Dr. Clarisa Kindred - BCVA OD: 20/20; 20/25 OS -- stable - FA (09.05.23) OD: Perisistent vitreous opacities settled inferiorly inferiorly, pre-retinal fibrosis inferiorly -- stable, mild interval increase in cystic changes temporal fovea and mac OS: Mild NVD, vascular non-perfusion and perivascular leakage temporal periphery - OCT today shows OD: Persistent vitreous opacities settled inferiorly, pre-retinal fibrosis inferiorly -- stable, mild interval improvement in cystic changes temporal fovea and mac; OS: Mild interval improvement in IRF / IRHM temporal macula, interval improvement in vitreous opacities at 7 wks - recommend IVA OU (OD #12 and OS #8) today, 08.20.24 with follow up extended to 8 weeks - pt wishes to proceed with injections - RBA of procedure discussed, questions answered - see procedure note  - IVA informed consent obtained and signed, 09.13.23 (OS) - VH precautions reviewed -- minimize activities,  keep head elevated, avoid ASA/NSAIDs/blood thinners as able  - f/u 8 weeks, DFE, OCT, possible injection(s)  5. Epiretinal membrane, right eye  - mild ERM OD - BCVA 20/20 - stable - asymptomatic, no metamorphopsia - no indication for surgery at this time - continue to monitor  6,7. Hypertensive retinopathy OU - discussed importance of tight BP control - continue to monitor  8.  Pseudophakia OU  - s/p CE/IOL OU  - IOLs in good position, doing well  - continue to monitor  9. Ocular Hypertension OU  - IOP 19,18 on Brim BID OD  - continue brimonidine bid OD  Ophthalmic Meds Ordered this visit:  Meds ordered this encounter  Medications   Bevacizumab (AVASTIN) SOLN 1.25 mg   Bevacizumab (AVASTIN) SOLN 1.25 mg     Return in about 8 weeks (around 01/17/2023) for f/u PDR OU, DFE, OCT.  There are no Patient Instructions on file for this visit.   Explained the diagnoses, plan, and follow up with the patient and they expressed understanding.  Patient expressed understanding of the importance of proper follow up care.   This document serves as a record of services personally performed by Karie Chimera, MD, PhD. It was created on their behalf by Laurey Morale, COT an ophthalmic technician. The creation of this record is the provider's dictation and/or activities during the visit.    Electronically signed by:  Charlette Caffey, COT  11/22/22 5:05 PM  This document serves as a record of services personally performed by Karie Chimera, MD, PhD. It was created on their behalf by Glee Arvin. Manson Passey, OA an ophthalmic technician. The creation of this record is the provider's dictation and/or activities during the visit.    Electronically signed by: Glee Arvin. Manson Passey, OA 11/22/22 5:05 PM  Karie Chimera, M.D., Ph.D. Diseases & Surgery of the Retina and Vitreous [facility]  I have reviewed the above documentation for accuracy and completeness, and I agree with the above. Karie Chimera, M.D., Ph.D. 11/22/22 5:06 PM   Abbreviations: M myopia (nearsighted); A astigmatism; H hyperopia (farsighted); P presbyopia; Mrx spectacle prescription;  CTL contact lenses; OD right eye; OS left eye; OU both eyes  XT exotropia; ET esotropia; PEK punctate epithelial keratitis; PEE punctate epithelial erosions; DES dry eye syndrome; MGD meibomian gland  dysfunction; ATs artificial tears; PFAT's preservative free artificial tears; NSC nuclear sclerotic cataract; PSC posterior subcapsular cataract; ERM epi-retinal membrane; PVD posterior vitreous detachment; RD retinal detachment; DM diabetes mellitus; DR diabetic retinopathy; NPDR non-proliferative diabetic retinopathy; PDR proliferative diabetic retinopathy; CSME clinically significant macular edema; DME diabetic macular edema; dbh dot blot hemorrhages; CWS cotton wool spot; POAG primary open angle glaucoma; C/D cup-to-disc ratio; HVF humphrey visual field; GVF goldmann visual field; OCT optical coherence tomography; IOP intraocular pressure; BRVO Branch retinal vein occlusion; CRVO central retinal vein occlusion; CRAO central retinal artery occlusion; BRAO branch retinal artery occlusion; RT retinal tear; SB scleral buckle; PPV pars plana vitrectomy; VH Vitreous hemorrhage; PRP panretinal laser photocoagulation; IVK intravitreal kenalog; VMT vitreomacular traction; MH Macular hole;  NVD neovascularization of the disc; NVE neovascularization elsewhere; AREDS age related eye disease study; ARMD age related macular degeneration; POAG primary open angle glaucoma; EBMD epithelial/anterior basement membrane dystrophy; ACIOL anterior chamber intraocular lens; IOL intraocular lens; PCIOL posterior chamber intraocular lens; Phaco/IOL phacoemulsification with intraocular lens placement; PRK photorefractive keratectomy; LASIK laser assisted in situ keratomileusis; HTN hypertension; DM diabetes mellitus; COPD chronic obstructive pulmonary disease

## 2022-11-22 ENCOUNTER — Encounter (INDEPENDENT_AMBULATORY_CARE_PROVIDER_SITE_OTHER): Payer: Self-pay | Admitting: Ophthalmology

## 2022-11-22 ENCOUNTER — Ambulatory Visit (INDEPENDENT_AMBULATORY_CARE_PROVIDER_SITE_OTHER): Payer: No Typology Code available for payment source | Admitting: Ophthalmology

## 2022-11-22 DIAGNOSIS — Z794 Long term (current) use of insulin: Secondary | ICD-10-CM

## 2022-11-22 DIAGNOSIS — I10 Essential (primary) hypertension: Secondary | ICD-10-CM

## 2022-11-22 DIAGNOSIS — H35371 Puckering of macula, right eye: Secondary | ICD-10-CM

## 2022-11-22 DIAGNOSIS — H40051 Ocular hypertension, right eye: Secondary | ICD-10-CM

## 2022-11-22 DIAGNOSIS — H4311 Vitreous hemorrhage, right eye: Secondary | ICD-10-CM

## 2022-11-22 DIAGNOSIS — E113513 Type 2 diabetes mellitus with proliferative diabetic retinopathy with macular edema, bilateral: Secondary | ICD-10-CM | POA: Diagnosis not present

## 2022-11-22 DIAGNOSIS — Z961 Presence of intraocular lens: Secondary | ICD-10-CM

## 2022-11-22 DIAGNOSIS — H35033 Hypertensive retinopathy, bilateral: Secondary | ICD-10-CM

## 2022-11-22 DIAGNOSIS — Z7984 Long term (current) use of oral hypoglycemic drugs: Secondary | ICD-10-CM

## 2022-11-22 MED ORDER — BEVACIZUMAB CHEMO INJECTION 1.25MG/0.05ML SYRINGE FOR KALEIDOSCOPE
1.2500 mg | INTRAVITREAL | Status: AC | PRN
Start: 2022-11-22 — End: 2022-11-22
  Administered 2022-11-22: 1.25 mg via INTRAVITREAL

## 2022-12-26 ENCOUNTER — Other Ambulatory Visit (INDEPENDENT_AMBULATORY_CARE_PROVIDER_SITE_OTHER): Payer: Self-pay

## 2022-12-28 ENCOUNTER — Other Ambulatory Visit (INDEPENDENT_AMBULATORY_CARE_PROVIDER_SITE_OTHER): Payer: Self-pay | Admitting: Ophthalmology

## 2022-12-28 MED ORDER — BRIMONIDINE TARTRATE 0.2 % OP SOLN: 1.0000 [drp] | Freq: Two times a day (BID) | OPHTHALMIC | 6 refills | Status: AC

## 2023-01-11 NOTE — Progress Notes (Signed)
[facility] - Clinic Note  01/24/2023    CHIEF COMPLAINT Patient presents for Retina Follow Up   HISTORY OF PRESENT ILLNESS: Matthew Sellers is a 82 y.o. male who presents to the clinic today for:   HPI     Retina Follow Up   Patient presents with  Diabetic Retinopathy.  In both eyes.  This started 8 weeks ago.  Duration of 8 weeks.  Since onset it is stable.        Comments   8 week retina follow up PDR OU IVA OU pt is reporting no vision changes noticed he denies any flashes or floaters pt last reading 147 yesterday he is using brim OD bid simb bid OU latanoprost at bedtime ou refresh prn       Last edited by Etheleen Mayhew, COT on 01/24/2023  9:05 AM.     Patient is a week delayed to a vacation. He feels the vision is the same.   Referring physician: No referring provider defined for this encounter.  HISTORICAL INFORMATION:   Selected notes from the MEDICAL RECORD NUMBER [facility]:  Ocular Hx- PMH   CURRENT MEDICATIONS: Current Outpatient Medications (Ophthalmic Drugs)  Medication Sig   brimonidine (ALPHAGAN) 0.2 % ophthalmic solution Place 1 drop into the right eye 2 (two) times daily.   Brinzolamide-Brimonidine 1-0.2 % SUSP Place 1 drop into both eyes 2 (two) times daily.   carboxymethylcellulose (REFRESH PLUS) 0.5 % SOLN INSTILL 1 DROP IN BOTH EYES FIVE TIMES A DAY   latanoprost (XALATAN) 0.005 % ophthalmic solution Place 1 drop into both eyes at bedtime.   Latanoprost 0.005 % EMUL    timolol (TIMOPTIC-XR) 0.5 % ophthalmic gel-forming SMARTSIG:In Eye(s)   No current facility-administered medications for this visit. (Ophthalmic Drugs)   Current Outpatient Medications (Other)  Medication Sig   amLODipine (NORVASC) 10 MG tablet Take 10 mg by mouth daily.   aspirin EC 81 MG tablet Take 81 mg by mouth daily.   cefpodoxime (VANTIN) 200 MG tablet    fluticasone (FLONASE) 50 MCG/ACT nasal spray Place 2 sprays  into both nostrils daily as needed for allergies or rhinitis.   gabapentin (NEURONTIN) 100 MG capsule Take 1 capsule by mouth 3 (three) times daily.   gabapentin (NEURONTIN) 100 MG capsule Take 2 capsules by mouth at bedtime.   HYDROcodone-acetaminophen (NORCO/VICODIN) 5-325 MG tablet Take 1-2 tablets by mouth every 4 (four) hours as needed for moderate pain.   insulin glargine (LANTUS) 100 UNIT/ML injection Inject 30 Units into the skin at bedtime.    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   Krill Oil 300 MG CAPS Take 300 mg by mouth daily.   lidocaine (LIDODERM) 5 % Place 1 patch onto the skin daily. Remove & Discard patch within 12 hours or as directed by MD   lisinopril (ZESTRIL) 20 MG tablet Take 2 tablets by mouth daily.   lisinopril-hydrochlorothiazide (ZESTORETIC) 20-25 MG tablet Take 2 tablets by mouth every morning.   metFORMIN (GLUCOPHAGE) 1000 MG tablet Take 1,000 mg by mouth 2 (two) times daily with a meal.   nitroGLYCERIN (NITROSTAT) 0.4 MG SL tablet Place 0.4 mg under the tongue every 5 (five) minutes as needed for chest pain.   pravastatin (PRAVACHOL) 20 MG tablet Take 20 mg by mouth at bedtime.  PRECISION XTRA TEST STRIPS test strip    No current facility-administered medications for this visit. (Other)   REVIEW OF SYSTEMS: ROS   Positive for: Genitourinary, Endocrine, Cardiovascular, Eyes Negative for: Constitutional, Gastrointestinal, Neurological, Skin, Musculoskeletal, HENT, Respiratory, Psychiatric, Allergic/Imm, Heme/Lymph Last edited by Etheleen Mayhew, COT on 01/24/2023  9:03 AM.      ALLERGIES No Known Allergies  PAST MEDICAL HISTORY Past Medical History:  Diagnosis Date   Anemia    Bradycardia    CAD (coronary artery disease)    Chest tightness    Chronic kidney disease    Diabetes mellitus without  complication (HCC)    Diabetic retinopathy (HCC)    Dyslipidemia    Dyspnea on exertion    Glaucoma    Hypertension    Obesity    Paroxysmal atrial fibrillation (HCC)    a. identified on device interrogation   Pneumonia    Past Surgical History:  Procedure Laterality Date   ACHILLES TENDON SURGERY     for rupture   CARDIAC CATHETERIZATION  07/08/2009   mild to mod . prox LAD 50% (Dr. Garen Lah)   CATARACT EXTRACTION     CHOLECYSTECTOMY N/A 10/11/2017   Procedure: LAPAROSCOPIC CHOLECYSTECTOMY WITH INTRAOPERATIVE CHOLANGIOGRAM;  Surgeon: Ovidio Kin, MD;  Location: [facility] OR;  Service: General;  Laterality: N/A;   DOPPLER ECHOCARDIOGRAPHY  04/10/2008   LVEF 70-80% ,norm   EYE SURGERY     PACEMAKER IMPLANT N/A 10/12/2017   Procedure: PACEMAKER IMPLANT;  Surgeon: Marinus Maw, MD;  Location: [facility] INVASIVE CV [facility];  Service: Cardiovascular;  Laterality: N/A;   stress myoview  04/09/2008   EF 54%; LV  norm   TEMPORARY PACEMAKER N/A 10/11/2017   Procedure: TEMPORARY PACEMAKER;  Surgeon: Marinus Maw, MD;  Location: [facility] CV [facility];  Service: Cardiovascular;  Laterality: N/A;   FAMILY HISTORY Family History  Problem Relation Age of Onset   Leukemia Mother    SOCIAL HISTORY Social History   Tobacco Use   Smoking status: Never   Smokeless tobacco: Never  Vaping Use   Vaping status: Never Used  Substance Use Topics   Alcohol use: No    Alcohol/week: 0.0 standard drinks of alcohol   Drug use: No       OPHTHALMIC EXAM:  Base Eye Exam     Visual Acuity (Snellen - Linear)       Right Left   Dist cc 20/20 20/25   Dist ph cc  NI         Tonometry (Tonopen, 9:08 AM)       Right Left   Pressure 17 18         Pupils       Pupils Dark Light Shape React APD   Right PERRL 2 1 Round Brisk None   Left PERRL 2 1 Round Brisk None         Visual Fields       Left Right    Full Full         Extraocular Movement       Right Left    Full, Ortho Full,  Ortho         Neuro/Psych     Oriented x3: Yes   Mood/Affect: Normal         Dilation     Both eyes: 2.5% Phenylephrine @ 9:09 AM           Slit Lamp and Fundus Exam     Slit Lamp Exam  Right Left   Lids/Lashes Dermatochalasis - upper lid, mild MGD Dermatochalasis - upper lid, mild MGD   Conjunctiva/Sclera nasal pingeucula, Melanosis nasal pingeucula, Melanosis   Cornea 2-3+ Punctate epithelial erosions, well healed cataract wound mild arcus, well healed cataract wound   Anterior Chamber deep and clear deep and clear   Iris round and moderately dilated, No NVI round and moderdilated to 4mm, No NVI   Lens PC IOL in good position PC IOL in good position   Anterior Vitreous Mild Vitreous syneresis, Posterior vitreous detachment, blood stained vitreous condensations inferiorly -- clearing centrally Mild Vitreous syneresis, Posterior vitreous detachment, vitreous condensations, interval improvement in blood stained vitreous condensations         Fundus Exam       Right Left   Disc 2+Pallor, Sharp rim, +cupping mild Pallor, Sharp rim, +cupping, focal PPP temporal, vascular loops, early NVD -- stably improved   C/D Ratio 0.7 0.5   Macula Flat, good foveal reflex, scattered Microaneurysms, trace cystic changes -- improved, RPE mottling and clumping Flat, good foveal reflex, rare MA/DBH, mild cystic changes temporal mac   Vessels attenuated, mild tortuosity attenuated, Tortuous, no obvious NV   Periphery Attached, +NV along inferior arcades and nasal midzone - improved, 360 MA/DBH, pre-retinal hemes settled inferiorly - improved, good 360 PRP changes -- with good fill in, scattered fibrosis, ?shallow schisis nasal periphery Attached, 360 MA/DBH, focal exudates nasal midzone, mild PRP laser changes 360           Refraction     Wearing Rx       Sphere Cylinder Axis Add   Right -0.75 +1.25 004 +2.75   Left -0.75 +1.25 004 +2.75    Type: PAL            IMAGING AND PROCEDURES  Imaging and Procedures for 01/24/2023          ASSESSMENT/PLAN:    ICD-10-CM   1. Proliferative diabetic retinopathy of both eyes with macular edema associated with type 2 diabetes mellitus (HCC)  E11.3513 OCT, Retina - OU - Both Eyes    2. Current use of insulin (HCC)  Z79.4     3. Long term (current) use of oral hypoglycemic drugs  Z79.84     4. Vitreous hemorrhage, right eye (HCC)  H43.11     5. Epiretinal membrane (ERM) of right eye  H35.371     6. Essential hypertension  I10     7. Hypertensive retinopathy of both eyes  H35.033     8. Pseudophakia, both eyes  Z96.1     9. Ocular hypertension of right eye  H40.051       1-4. Proliferative diabetic retinopathy OU w/ vitreous hemorrhage, OD   - A1C 7.4 (09.12.24) - s/p IVA OD #1 (12.19.22), #2 (01.16.23), #3 (02.16.23), #4 (04.04.23), #5 (09.13.23), #6 (10.11.23), #7 (11.27.23), #8 (01.29.24), #9 (03.25.24), #10 (05.13.24), #11 (07.02.24), #12 (08.20.24) - s/p IVA OS #1 (09.13.23), #2 (10.11.23), #3 (11.27.23), #4 (01.29.24), #5 (03.25.24), #6 (05.13.24), #7 (07.02.24), #8 (08.20.24)  - s/p PRP OD (02.27.23), (09.19.23)  - s/p PRP OS (05.28.24)  - referred by [facility] -- formerly followed by Dr. Clarisa Kindred - BCVA OD: 20/20; 20/25 OS -- stable - FA (09.05.23) OD: Perisistent vitreous opacities settled inferiorly inferiorly, pre-retinal fibrosis inferiorly -- stable, mild interval increase in cystic changes temporal fovea and mac OS: Mild NVD, vascular non-perfusion and perivascular leakage temporal periphery - OCT today shows OD: Persistent vitreous opacities settled inferiorly, pre-retinal fibrosis inferiorly --  stable, mild interval improvement in cystic changes temporal fovea and mac; OS: Mild interval improvement in IRF / IRHM temporal macula, stable improvement in vitreous opacities at 7 wks - recommend IVA OU (OD #13 and OS #9) today, 10.22.24 with follow up in 8 weeks - pt wishes to proceed with  injections - RBA of procedure discussed, questions answered - see procedure note  - IVA informed consent obtained and signed, 09.13.23 (OS) - IVA informed consent obtained and signed, 10.22.24 (OU) - VH precautions reviewed -- minimize activities, keep head elevated, avoid ASA/NSAIDs/blood thinners as able  - f/u 9 weeks, DFE, OCT, possible injection(s)  5. Epiretinal membrane, right eye  - mild ERM OD - BCVA 20/20 - stable - asymptomatic, no metamorphopsia - no indication for surgery at this time - continue to monitor  6,7. Hypertensive retinopathy OU - discussed importance of tight BP control - continue to monitor  8. Pseudophakia OU  - s/p CE/IOL OU  - IOLs in good position, doing well  - continue to monitor  9. Ocular Hypertension OU  - IOP 17,18 on Brim BID OD  - continue Brimonidine bid OD  Ophthalmic Meds Ordered this visit:  No orders of the defined types were placed in this encounter.    No follow-ups on file.  There are no Patient Instructions on file for this visit.   Explained the diagnoses, plan, and follow up with the patient and they expressed understanding.  Patient expressed understanding of the importance of proper follow up care.   This document serves as a record of services personally performed by Karie Chimera, MD, PhD. It was created on their behalf by Laurey Morale, COT an ophthalmic technician. The creation of this record is the provider's dictation and/or activities during the visit.    Electronically signed by:  Charlette Caffey, COT  01/24/23 9:36 AM  Karie Chimera, M.D., Ph.D. Diseases & Surgery of the Retina and Vitreous [facility]    Abbreviations: M myopia (nearsighted); A astigmatism; H hyperopia (farsighted); P presbyopia; Mrx spectacle prescription;  CTL contact lenses; OD right eye; OS left eye; OU both eyes  XT exotropia; ET esotropia; PEK punctate epithelial keratitis; PEE punctate epithelial  erosions; DES dry eye syndrome; MGD meibomian gland dysfunction; ATs artificial tears; PFAT's preservative free artificial tears; NSC nuclear sclerotic cataract; PSC posterior subcapsular cataract; ERM epi-retinal membrane; PVD posterior vitreous detachment; RD retinal detachment; DM diabetes mellitus; DR diabetic retinopathy; NPDR non-proliferative diabetic retinopathy; PDR proliferative diabetic retinopathy; CSME clinically significant macular edema; DME diabetic macular edema; dbh dot blot hemorrhages; CWS cotton wool spot; POAG primary open angle glaucoma; C/D cup-to-disc ratio; HVF humphrey visual field; GVF goldmann visual field; OCT optical coherence tomography; IOP intraocular pressure; BRVO Branch retinal vein occlusion; CRVO central retinal vein occlusion; CRAO central retinal artery occlusion; BRAO branch retinal artery occlusion; RT retinal tear; SB scleral buckle; PPV pars plana vitrectomy; VH Vitreous hemorrhage; PRP panretinal laser photocoagulation; IVK intravitreal kenalog; VMT vitreomacular traction; MH Macular hole;  NVD neovascularization of the disc; NVE neovascularization elsewhere; AREDS age related eye disease study; ARMD age related macular degeneration; POAG primary open angle glaucoma; EBMD epithelial/anterior basement membrane dystrophy; ACIOL anterior chamber intraocular lens; IOL intraocular lens; PCIOL posterior chamber intraocular lens; Phaco/IOL phacoemulsification with intraocular lens placement; PRK photorefractive keratectomy; LASIK laser assisted in situ keratomileusis; HTN hypertension; DM diabetes mellitus; COPD chronic obstructive pulmonary disease

## 2023-01-24 ENCOUNTER — Ambulatory Visit (INDEPENDENT_AMBULATORY_CARE_PROVIDER_SITE_OTHER): Payer: No Typology Code available for payment source | Admitting: Ophthalmology

## 2023-01-24 ENCOUNTER — Encounter (INDEPENDENT_AMBULATORY_CARE_PROVIDER_SITE_OTHER): Payer: Self-pay | Admitting: Ophthalmology

## 2023-01-24 DIAGNOSIS — Z794 Long term (current) use of insulin: Secondary | ICD-10-CM | POA: Diagnosis not present

## 2023-01-24 DIAGNOSIS — H35033 Hypertensive retinopathy, bilateral: Secondary | ICD-10-CM

## 2023-01-24 DIAGNOSIS — H40051 Ocular hypertension, right eye: Secondary | ICD-10-CM

## 2023-01-24 DIAGNOSIS — E113513 Type 2 diabetes mellitus with proliferative diabetic retinopathy with macular edema, bilateral: Secondary | ICD-10-CM | POA: Diagnosis not present

## 2023-01-24 DIAGNOSIS — H4311 Vitreous hemorrhage, right eye: Secondary | ICD-10-CM | POA: Diagnosis not present

## 2023-01-24 DIAGNOSIS — Z7984 Long term (current) use of oral hypoglycemic drugs: Secondary | ICD-10-CM

## 2023-01-24 DIAGNOSIS — Z961 Presence of intraocular lens: Secondary | ICD-10-CM

## 2023-01-24 DIAGNOSIS — I10 Essential (primary) hypertension: Secondary | ICD-10-CM

## 2023-01-24 DIAGNOSIS — H35371 Puckering of macula, right eye: Secondary | ICD-10-CM

## 2023-01-24 MED ORDER — BEVACIZUMAB CHEMO INJECTION 1.25MG/0.05ML SYRINGE FOR KALEIDOSCOPE
1.2500 mg | INTRAVITREAL | Status: AC | PRN
Start: 2023-01-24 — End: 2023-01-24
  Administered 2023-01-24: 1.25 mg via INTRAVITREAL

## 2023-03-07 NOTE — Progress Notes (Signed)
[facility] - Clinic Note  03/21/2023    CHIEF COMPLAINT Patient presents for Retina Follow Up   HISTORY OF PRESENT ILLNESS: Matthew Sellers is a 82 y.o. male who presents to the clinic today for:   HPI     Retina Follow Up   Patient presents with  Diabetic Retinopathy.  In both eyes.  This started 8 weeks ago.  Duration of 8 weeks.  Since onset it is stable.  I, the attending physician,  performed the HPI with the patient and updated documentation appropriately.        Comments   Patient states that he has had trouble with night driving. He is seeing bright light streaks. He is using Brimonidine OD BID, Simbrinza OU BID, AT' s PRN, and Timolol OU QAM. He did not check his blood sugar.       Last edited by Rennis Chris, MD on 03/21/2023 12:44 PM.      Referring physician: No referring provider defined for this encounter.  HISTORICAL INFORMATION:   Selected notes from the MEDICAL RECORD NUMBER [facility]:  Ocular Hx- PMH   CURRENT MEDICATIONS: Current Outpatient Medications (Ophthalmic Drugs)  Medication Sig   brimonidine (ALPHAGAN) 0.2 % ophthalmic solution Place 1 drop into the right eye 2 (two) times daily.   Brinzolamide-Brimonidine 1-0.2 % SUSP Place 1 drop into both eyes 2 (two) times daily.   carboxymethylcellulose (REFRESH PLUS) 0.5 % SOLN INSTILL 1 DROP IN BOTH EYES FIVE TIMES A DAY   latanoprost (XALATAN) 0.005 % ophthalmic solution Place 1 drop into both eyes at bedtime.   Latanoprost 0.005 % EMUL    timolol (TIMOPTIC-XR) 0.5 % ophthalmic gel-forming SMARTSIG:In Eye(s)   No current facility-administered medications for this visit. (Ophthalmic Drugs)   Current Outpatient Medications (Other)  Medication Sig   amLODipine (NORVASC) 10 MG tablet Take 10 mg by mouth daily.   aspirin EC 81 MG tablet Take 81 mg by mouth daily.   cefpodoxime (VANTIN) 200 MG tablet    fluticasone (FLONASE) 50 MCG/ACT nasal spray  Place 2 sprays into both nostrils daily as needed for allergies or rhinitis.   gabapentin (NEURONTIN) 100 MG capsule Take 1 capsule by mouth 3 (three) times daily.   gabapentin (NEURONTIN) 100 MG capsule Take 2 capsules by mouth at bedtime.   HYDROcodone-acetaminophen (NORCO/VICODIN) 5-325 MG tablet Take 1-2 tablets by mouth every 4 (four) hours as needed for moderate pain.   insulin glargine (LANTUS) 100 UNIT/ML injection Inject 30 Units into the skin at bedtime.    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   Krill Oil 300 MG CAPS Take 300 mg by mouth daily.   lidocaine (LIDODERM) 5 % Place 1 patch onto the skin daily. Remove & Discard patch within 12 hours or as directed by MD   lisinopril (ZESTRIL) 20 MG tablet Take 2 tablets by mouth daily.   lisinopril-hydrochlorothiazide (ZESTORETIC) 20-25 MG tablet Take 2 tablets by mouth every morning.   metFORMIN (GLUCOPHAGE) 1000 MG tablet Take 1,000 mg by mouth 2 (two) times daily with a meal.   nitroGLYCERIN (NITROSTAT) 0.4 MG SL tablet Place 0.4 mg under the tongue every 5 (five) minutes as needed for chest pain.   pravastatin (PRAVACHOL) 20 MG tablet Take 20 mg by mouth at bedtime.   PRECISION XTRA TEST STRIPS  test strip    No current facility-administered medications for this visit. (Other)   REVIEW OF SYSTEMS: ROS   Positive for: Genitourinary, Endocrine, Cardiovascular, Eyes Negative for: Constitutional, Gastrointestinal, Neurological, Skin, Musculoskeletal, HENT, Respiratory, Psychiatric, Allergic/Imm, Heme/Lymph Last edited by Charlette Caffey, COT on 03/21/2023  9:14 AM.     ALLERGIES No Known Allergies  PAST MEDICAL HISTORY Past Medical History:  Diagnosis Date   Anemia    Bradycardia    CAD (coronary artery disease)    Chest tightness    Chronic kidney disease    Diabetes mellitus  without complication (HCC)    Diabetic retinopathy (HCC)    Dyslipidemia    Dyspnea on exertion    Glaucoma    Hypertension    Obesity    Paroxysmal atrial fibrillation (HCC)    a. identified on device interrogation   Pneumonia    Past Surgical History:  Procedure Laterality Date   ACHILLES TENDON SURGERY     for rupture   CARDIAC CATHETERIZATION  07/08/2009   mild to mod . prox LAD 50% (Dr. Garen Lah)   CATARACT EXTRACTION     CHOLECYSTECTOMY N/A 10/11/2017   Procedure: LAPAROSCOPIC CHOLECYSTECTOMY WITH INTRAOPERATIVE CHOLANGIOGRAM;  Surgeon: Ovidio Kin, MD;  Location: [facility] OR;  Service: General;  Laterality: N/A;   DOPPLER ECHOCARDIOGRAPHY  04/10/2008   LVEF 70-80% ,norm   EYE SURGERY     PACEMAKER IMPLANT N/A 10/12/2017   Procedure: PACEMAKER IMPLANT;  Surgeon: Marinus Maw, MD;  Location: [facility] INVASIVE CV LAB;  Service: Cardiovascular;  Laterality: N/A;   stress myoview  04/09/2008   EF 54%; LV  norm   TEMPORARY PACEMAKER N/A 10/11/2017   Procedure: TEMPORARY PACEMAKER;  Surgeon: Marinus Maw, MD;  Location: [facility] INVASIVE CV LAB;  Service: Cardiovascular;  Laterality: N/A;   FAMILY HISTORY Family History  Problem Relation Age of Onset   Leukemia Mother    SOCIAL HISTORY Social History   Tobacco Use   Smoking status: Never   Smokeless tobacco: Never  Vaping Use   Vaping status: Never Used  Substance Use Topics   Alcohol use: No    Alcohol/week: 0.0 standard drinks of alcohol   Drug use: No       OPHTHALMIC EXAM:  Base Eye Exam     Visual Acuity (Snellen - Linear)       Right Left   Dist cc 20/25 +2 20/25   Dist ph cc NI NI    Correction: Glasses         Tonometry (Tonopen, 9:17 AM)       Right Left   Pressure 10 9         Pupils       Dark Light Shape React APD   Right 2 1 Round Brisk None   Left 2 1 Round Brisk None         Visual Fields       Left Right    Full Full         Extraocular Movement       Right Left     Full, Ortho Full, Ortho         Neuro/Psych     Oriented x3: Yes   Mood/Affect: Normal         Dilation     Both eyes: 2.5% Phenylephrine, 1.0% Mydriacyl @ 9:14 AM           Slit Lamp and Fundus Exam     Slit Lamp Exam  Right Left   Lids/Lashes Dermatochalasis - upper lid, mild MGD Dermatochalasis - upper lid, mild MGD   Conjunctiva/Sclera nasal pingeucula, Melanosis nasal pingeucula, Melanosis   Cornea 2+ Punctate epithelial erosions, well healed cataract wound, Arcus mild arcus, well healed cataract wound, 1-2+ Punctate epithelial erosions, Debris in tear film   Anterior Chamber deep and clear deep and clear   Iris round and moderately dilated, No NVI round and moderdilated to 4mm, No NVI   Lens PC IOL in good position PC IOL in good position   Anterior Vitreous Mild Vitreous syneresis, Posterior vitreous detachment, blood stained vitreous condensations -- improved Mild Vitreous syneresis, Posterior vitreous detachment, vitreous condensations, stable improvement in blood stained vitreous condensations         Fundus Exam       Right Left   Disc 2+Pallor, Sharp rim, +cupping mild Pallor, Sharp rim, +cupping, focal PPP temporal, vascular loops, early NVD -- stably improved   C/D Ratio 0.7 0.5   Macula Flat, good foveal reflex, scattered Microaneurysms, trace cystic changes -- slightly increased, RPE mottling and clumping Flat, good foveal reflex, rare MA/DBH, mild cystic changes temporal mac- slightly increased   Vessels attenuated, mild tortuosity, mild fibrosis along IT arcades attenuated, Tortuous, no obvious NV, Copper wiring   Periphery Attached, +NV along inferior arcades and nasal midzone - improved, 360 MA/DBH, pre-retinal hemes settled inferiorly - improved, good 360 PRP changes -- with good fill in, scattered fibrosis, ?shallow schisis nasal periphery Attached, 360 MA/DBH, focal exudates nasal midzone, mild PRP laser changes 360           Refraction      Wearing Rx       Sphere Cylinder Axis Add   Right -0.75 +1.25 004 +2.75   Left -0.75 +1.25 004 +2.75    Type: PAL           IMAGING AND PROCEDURES  Imaging and Procedures for 03/21/2023  OCT, Retina - OU - Both Eyes       Right Eye Quality was good. Central Foveal Thickness: 278. Progression has worsened. Findings include no IRF, no SRF, abnormal foveal contour, epiretinal membrane, preretinal fibrosis (Persistent vitreous opacities settled inferiorly, pre-retinal fibrosis inferiorly -- stable, mild interval increase in cystic changes temporal fovea and mac).   Left Eye Quality was borderline. Central Foveal Thickness: 267. Progression has worsened. Findings include normal foveal contour, no SRF, intraretinal hyper-reflective material, intraretinal fluid (Mild interval increase in IRF / IRHM temporal macula, stable improvement in vitreous opacities).   Notes *Images captured and stored on drive  Diagnosis / Impression:  OD: Persistent vitreous opacities settled inferiorly, pre-retinal fibrosis inferiorly -- stable, mild interval increase in cystic changes temporal fovea and mac OS: Mild interval increase in IRF / IRHM temporal macula, stable improvement in vitreous opacities  Clinical management:  See below  Abbreviations: NFP - Normal foveal profile. CME - cystoid macular edema. PED - pigment epithelial detachment. IRF - intraretinal fluid. SRF - subretinal fluid. EZ - ellipsoid zone. ERM - epiretinal membrane. ORA - outer retinal atrophy. ORT - outer retinal tubulation. SRHM - subretinal hyper-reflective material. IRHM - intraretinal hyper-reflective material      Intravitreal Injection, Pharmacologic Agent - OD - Right Eye       Time Out 03/21/2023. 10:02 AM. Confirmed correct patient, procedure, site, and patient consented.   Anesthesia Topical anesthesia was used. Anesthetic medications included Lidocaine 2%, Proparacaine 0.5%.   Procedure Preparation  included 5% betadine to ocular surface, eyelid speculum.  A (32g) needle was used.   Injection: 1.25 mg Bevacizumab 1.25mg /0.44ml   Route: Intravitreal, Site: Right Eye   NDC: P3213405, Lot: [id number], Expiration date: 06/12/2023   Post-op Post injection exam found visual acuity of at least counting fingers. The patient tolerated the procedure well. There were no complications. The patient received written and verbal post procedure care education. Post injection medications were not given.      Intravitreal Injection, Pharmacologic Agent - OS - Left Eye       Time Out 03/21/2023. 10:02 AM. Confirmed correct patient, procedure, site, and patient consented.   Anesthesia Topical anesthesia was used. Anesthetic medications included Lidocaine 2%, Proparacaine 0.5%.   Procedure Preparation included 5% betadine to ocular surface, eyelid speculum. A (32g) needle was used.   Injection: 1.25 mg Bevacizumab 1.25mg /0.11ml   Route: Intravitreal, Site: Left Eye   NDC: P3213405, Lot: [id number], Expiration date: 06/19/2023   Post-op Post injection exam found visual acuity of at least counting fingers. The patient tolerated the procedure well. There were no complications. The patient received written and verbal post procedure care education. Post injection medications were not given.            ASSESSMENT/PLAN:    ICD-10-CM   1. Proliferative diabetic retinopathy of both eyes with macular edema associated with type 2 diabetes mellitus (HCC)  E11.3513 OCT, Retina - OU - Both Eyes    Intravitreal Injection, Pharmacologic Agent - OD - Right Eye    Intravitreal Injection, Pharmacologic Agent - OS - Left Eye    Bevacizumab (AVASTIN) SOLN 1.25 mg    Bevacizumab (AVASTIN) SOLN 1.25 mg    2. Current use of insulin (HCC)  Z79.4     3. Long term (current) use of oral hypoglycemic drugs  Z79.84     4. Vitreous hemorrhage, right eye (HCC)  H43.11     5. Epiretinal membrane (ERM) of right eye   H35.371     6. Essential hypertension  I10     7. Hypertensive retinopathy of both eyes  H35.033     8. Pseudophakia, both eyes  Z96.1     9. Ocular hypertension of right eye  H40.051      1-4. Proliferative diabetic retinopathy OU w/ vitreous hemorrhage, OD   - A1C 7.4 (09.12.24) - s/p IVA OD #1 (12.19.22), #2 (01.16.23), #3 (02.16.23), #4 (04.04.23), #5 (09.13.23), #6 (10.11.23), #7 (11.27.23), #8 (01.29.24), #9 (03.25.24), #10 (05.13.24), #11 (07.02.24), #12 (08.20.24), #13 (10.22.24) - s/p IVA OS #1 (09.13.23), #2 (10.11.23), #3 (11.27.23), #4 (01.29.24), #5 (03.25.24), #6 (05.13.24), #7 (07.02.24), #8 (08.20.24), #9 (10.22.24)  - s/p PRP OD (02.27.23), (09.19.23)  - s/p PRP OS (05.28.24)  - referred by [facility] -- formerly followed by Dr. Clarisa Kindred - BCVA OD: 20/25 from 20/20; 20/25 OS -- stable - FA (09.05.23) OD: Perisistent vitreous opacities settled inferiorly inferiorly, pre-retinal fibrosis inferiorly -- stable, mild interval increase in cystic changes temporal fovea and mac OS: Mild NVD, vascular non-perfusion and perivascular leakage temporal periphery - OCT today shows OD: Persistent vitreous opacities settled inferiorly, pre-retinal fibrosis inferiorly -- stable, mild interval increase in cystic changes temporal fovea and mac; OS: Mild interval increase in IRF / IRHM temporal macula, stable improvement in vitreous opacities at 8 wks - recommend IVA OU (OD #14 and OS #10) today, 12.17.24 with follow up in 8 weeks - pt wishes to proceed with injections - RBA of procedure discussed, questions answered - see procedure note  - IVA informed consent obtained and  signed, 10.22.24 (OU) - VH precautions reviewed -- minimize activities, keep head elevated, avoid ASA/NSAIDs/blood thinners as able  - f/u 8 weeks, DFE, OCT, possible injection(s)  5. Epiretinal membrane, right eye  - mild ERM OD - BCVA 20/25 - stable - asymptomatic, no metamorphopsia - no indication for surgery at this  time - continue to monitor  6,7. Hypertensive retinopathy OU - discussed importance of tight BP control - continue to monitor  8. Pseudophakia OU  - s/p CE/IOL OU  - IOLs in good position, doing well  - continue to monitor  9. Ocular Hypertension OU  - IOP 10,9 on Brim BID OD  - continue Brimonidine bid OD, Simbrinza OU BID, and Timolol OU QAM   Ophthalmic Meds Ordered this visit:  Meds ordered this encounter  Medications   Bevacizumab (AVASTIN) SOLN 1.25 mg   Bevacizumab (AVASTIN) SOLN 1.25 mg     Return in about 8 weeks (around 05/16/2023) for f/u PDR OU, DFE, OCT, Possible, IVA, OU.  There are no Patient Instructions on file for this visit.   Explained the diagnoses, plan, and follow up with the patient and they expressed understanding.  Patient expressed understanding of the importance of proper follow up care.   This document serves as a record of services personally performed by Karie Chimera, MD, PhD. It was created on their behalf by Laurey Morale, COT an ophthalmic technician. The creation of this record is the provider's dictation and/or activities during the visit.    Electronically signed by:  Charlette Caffey, COT  03/22/23 10:29 PM  Karie Chimera, M.D., Ph.D. Diseases & Surgery of the Retina and Vitreous [facility]  I have reviewed the above documentation for accuracy and completeness, and I agree with the above. Karie Chimera, M.D., Ph.D. 03/22/23 10:30 PM  Abbreviations: M myopia (nearsighted); A astigmatism; H hyperopia (farsighted); P presbyopia; Mrx spectacle prescription;  CTL contact lenses; OD right eye; OS left eye; OU both eyes  XT exotropia; ET esotropia; PEK punctate epithelial keratitis; PEE punctate epithelial erosions; DES dry eye syndrome; MGD meibomian gland dysfunction; ATs artificial tears; PFAT's preservative free artificial tears; NSC nuclear sclerotic cataract; PSC posterior subcapsular cataract; ERM  epi-retinal membrane; PVD posterior vitreous detachment; RD retinal detachment; DM diabetes mellitus; DR diabetic retinopathy; NPDR non-proliferative diabetic retinopathy; PDR proliferative diabetic retinopathy; CSME clinically significant macular edema; DME diabetic macular edema; dbh dot blot hemorrhages; CWS cotton wool spot; POAG primary open angle glaucoma; C/D cup-to-disc ratio; HVF humphrey visual field; GVF goldmann visual field; OCT optical coherence tomography; IOP intraocular pressure; BRVO Branch retinal vein occlusion; CRVO central retinal vein occlusion; CRAO central retinal artery occlusion; BRAO branch retinal artery occlusion; RT retinal tear; SB scleral buckle; PPV pars plana vitrectomy; VH Vitreous hemorrhage; PRP panretinal laser photocoagulation; IVK intravitreal kenalog; VMT vitreomacular traction; MH Macular hole;  NVD neovascularization of the disc; NVE neovascularization elsewhere; AREDS age related eye disease study; ARMD age related macular degeneration; POAG primary open angle glaucoma; EBMD epithelial/anterior basement membrane dystrophy; ACIOL anterior chamber intraocular lens; IOL intraocular lens; PCIOL posterior chamber intraocular lens; Phaco/IOL phacoemulsification with intraocular lens placement; PRK photorefractive keratectomy; LASIK laser assisted in situ keratomileusis; HTN hypertension; DM diabetes mellitus; COPD chronic obstructive pulmonary disease

## 2023-03-21 ENCOUNTER — Ambulatory Visit (INDEPENDENT_AMBULATORY_CARE_PROVIDER_SITE_OTHER): Payer: No Typology Code available for payment source | Admitting: Ophthalmology

## 2023-03-21 ENCOUNTER — Encounter (INDEPENDENT_AMBULATORY_CARE_PROVIDER_SITE_OTHER): Payer: Self-pay | Admitting: Ophthalmology

## 2023-03-21 DIAGNOSIS — I10 Essential (primary) hypertension: Secondary | ICD-10-CM

## 2023-03-21 DIAGNOSIS — H4311 Vitreous hemorrhage, right eye: Secondary | ICD-10-CM | POA: Diagnosis not present

## 2023-03-21 DIAGNOSIS — Z794 Long term (current) use of insulin: Secondary | ICD-10-CM

## 2023-03-21 DIAGNOSIS — Z961 Presence of intraocular lens: Secondary | ICD-10-CM

## 2023-03-21 DIAGNOSIS — Z7984 Long term (current) use of oral hypoglycemic drugs: Secondary | ICD-10-CM | POA: Diagnosis not present

## 2023-03-21 DIAGNOSIS — H35371 Puckering of macula, right eye: Secondary | ICD-10-CM

## 2023-03-21 DIAGNOSIS — H40051 Ocular hypertension, right eye: Secondary | ICD-10-CM

## 2023-03-21 DIAGNOSIS — E113513 Type 2 diabetes mellitus with proliferative diabetic retinopathy with macular edema, bilateral: Secondary | ICD-10-CM

## 2023-03-21 DIAGNOSIS — H35033 Hypertensive retinopathy, bilateral: Secondary | ICD-10-CM

## 2023-03-21 MED ORDER — BEVACIZUMAB CHEMO INJECTION 1.25MG/0.05ML SYRINGE FOR KALEIDOSCOPE
1.2500 mg | INTRAVITREAL | Status: AC | PRN
  Administered 2023-03-21: 1.25 mg via INTRAVITREAL

## 2023-05-04 NOTE — Progress Notes (Shared)
[facility] - Clinic Note  05/16/2023    CHIEF COMPLAINT Patient presents for No chief complaint on file.   HISTORY OF PRESENT ILLNESS: Matthew Sellers is a 83 y.o. male who presents to the clinic today for:      Referring physician: No referring provider defined for this encounter.  HISTORICAL INFORMATION:   Selected notes from the MEDICAL RECORD NUMBER [facility]EE:  Ocular Hx- PMH   CURRENT MEDICATIONS: Current Outpatient Medications (Ophthalmic Drugs)  Medication Sig   brimonidine (ALPHAGAN) 0.2 % ophthalmic solution Place 1 drop into the right eye 2 (two) times daily.   Brinzolamide-Brimonidine 1-0.2 % SUSP Place 1 drop into both eyes 2 (two) times daily.   carboxymethylcellulose (REFRESH PLUS) 0.5 % SOLN INSTILL 1 DROP IN BOTH EYES FIVE TIMES A DAY   latanoprost (XALATAN) 0.005 % ophthalmic solution Place 1 drop into both eyes at bedtime.   Latanoprost 0.005 % EMUL    timolol (TIMOPTIC-XR) 0.5 % ophthalmic gel-forming SMARTSIG:In Eye(s)   No current facility-administered medications for this visit. (Ophthalmic Drugs)   Current Outpatient Medications (Other)  Medication Sig   amLODipine (NORVASC) 10 MG tablet Take 10 mg by mouth daily.   aspirin EC 81 MG tablet Take 81 mg by mouth daily.   cefpodoxime (VANTIN) 200 MG tablet    fluticasone (FLONASE) 50 MCG/ACT nasal spray Place 2 sprays into both nostrils daily as needed for allergies or rhinitis.   gabapentin (NEURONTIN) 100 MG capsule Take 1 capsule by mouth 3 (three) times daily.   gabapentin (NEURONTIN) 100 MG capsule Take 2 capsules by mouth at bedtime.   HYDROcodone-acetaminophen (NORCO/VICODIN) 5-325 MG tablet Take 1-2 tablets by mouth every 4 (four) hours as needed for moderate pain.   insulin glargine (LANTUS) 100 UNIT/ML injection Inject 30 Units into the skin at bedtime.    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   Krill Oil 300 MG CAPS Take 300 mg by mouth daily.   lidocaine (LIDODERM) 5 % Place 1 patch onto the skin daily. Remove & Discard patch within 12 hours or as directed by MD   lisinopril (ZESTRIL) 20 MG tablet Take 2 tablets by mouth daily.   lisinopril-hydrochlorothiazide (ZESTORETIC) 20-25 MG tablet Take 2 tablets by mouth every morning.   metFORMIN (GLUCOPHAGE) 1000 MG tablet Take 1,000 mg by mouth 2 (two) times daily with a meal.   nitroGLYCERIN (NITROSTAT) 0.4 MG SL tablet Place 0.4 mg under the tongue every 5 (five) minutes as needed for chest pain.   pravastatin (PRAVACHOL) 20 MG tablet Take 20 mg by mouth at bedtime.   PRECISION XTRA TEST STRIPS test strip    No current facility-administered medications for this visit. (Other)   REVIEW OF SYSTEMS:   ALLERGIES No Known Allergies  PAST MEDICAL HISTORY Past Medical History:  Diagnosis Date   Anemia    Bradycardia    CAD (coronary artery disease)    Chest tightness    Chronic kidney disease    Diabetes mellitus without complication (HCC)    Diabetic retinopathy (HCC)    Dyslipidemia    Dyspnea on exertion    Glaucoma    Hypertension    Obesity    Paroxysmal atrial fibrillation (HCC)    a. identified on device interrogation   Pneumonia    Past Surgical  History:  Procedure Laterality Date   ACHILLES TENDON SURGERY     for rupture   CARDIAC CATHETERIZATION  07/08/2009   mild to mod . prox LAD 50% (Dr. Garen Lah)   CATARACT EXTRACTION     CHOLECYSTECTOMY N/A 10/11/2017   Procedure: LAPAROSCOPIC CHOLECYSTECTOMY WITH INTRAOPERATIVE CHOLANGIOGRAM;  Surgeon: Ovidio Kin, MD;  Location: [facility] OR;  Service: General;  Laterality: N/A;   DOPPLER ECHOCARDIOGRAPHY  04/10/2008   LVEF 70-80% ,norm   EYE SURGERY     PACEMAKER IMPLANT N/A 10/12/2017   Procedure: PACEMAKER IMPLANT;  Surgeon: Marinus Maw, MD;  Location: [facility] INVASIVE CV  LAB;  Service: Cardiovascular;  Laterality: N/A;   stress myoview  04/09/2008   EF 54%; LV  norm   TEMPORARY PACEMAKER N/A 10/11/2017   Procedure: TEMPORARY PACEMAKER;  Surgeon: Marinus Maw, MD;  Location: [facility] INVASIVE CV LAB;  Service: Cardiovascular;  Laterality: N/A;   FAMILY HISTORY Family History  Problem Relation Age of Onset   Leukemia Mother    SOCIAL HISTORY Social History   Tobacco Use   Smoking status: Never   Smokeless tobacco: Never  Vaping Use   Vaping status: Never Used  Substance Use Topics   Alcohol use: No    Alcohol/week: 0.0 standard drinks of alcohol   Drug use: No       OPHTHALMIC EXAM:  Not recorded    IMAGING AND PROCEDURES  Imaging and Procedures for 05/16/2023          ASSESSMENT/PLAN:    ICD-10-CM   1. Proliferative diabetic retinopathy of both eyes with macular edema associated with type 2 diabetes mellitus (HCC)  Z61.0960     2. Current use of insulin (HCC)  Z79.4     3. Long term (current) use of oral hypoglycemic drugs  Z79.84     4. Vitreous hemorrhage, right eye (HCC)  H43.11     5. Epiretinal membrane (ERM) of right eye  H35.371     6. Essential hypertension  I10     7. Hypertensive retinopathy of both eyes  H35.033     8. Pseudophakia, both eyes  Z96.1     9. Ocular hypertension of right eye  H40.051       1-4. Proliferative diabetic retinopathy OU w/ vitreous hemorrhage, OD   - A1C 7.4 (09.12.24) - s/p IVA OD #1 (12.19.22), #2 (01.16.23), #3 (02.16.23), #4 (04.04.23), #5 (09.13.23), #6 (10.11.23), #7 (11.27.23), #8 (01.29.24), #9 (03.25.24), #10 (05.13.24), #11 (07.02.24), #12 (08.20.24), #13 (10.22.24), #14 (12.17.24) - s/p IVA OS #1 (09.13.23), #2 (10.11.23), #3 (11.27.23), #4 (01.29.24), #5 (03.25.24), #6 (05.13.24), #7 (07.02.24), #8 (08.20.24), #9 (10.22.24), #10 (12.17.24)  - s/p PRP OD (02.27.23), (09.19.23)  - s/p PRP OS (05.28.24)  - referred by [facility] -- formerly followed by Dr. Clarisa Kindred - BCVA OD: 20/25  from 20/20; 20/25 OS -- stable - FA (09.05.23) OD: Perisistent vitreous opacities settled inferiorly inferiorly, pre-retinal fibrosis inferiorly -- stable, mild interval increase in cystic changes temporal fovea and mac OS: Mild NVD, vascular non-perfusion and perivascular leakage temporal periphery - OCT today shows OD: Persistent vitreous opacities settled inferiorly, pre-retinal fibrosis inferiorly -- stable, mild interval increase in cystic changes temporal fovea and mac; OS: Mild interval increase in IRF / IRHM temporal macula, stable improvement in vitreous opacities at 8 wks - recommend IVA OU (OD #15 and OS #11) today, 02.11.25 with follow up in 8 weeks - pt wishes to proceed with injections - RBA of procedure discussed, questions answered -  see procedure note  - IVA informed consent obtained and signed, 10.22.24 (OU) - VH precautions reviewed -- minimize activities, keep head elevated, avoid ASA/NSAIDs/blood thinners as able  - f/u 8 weeks, DFE, OCT, possible injection(s)  5. Epiretinal membrane, right eye  - mild ERM OD - BCVA 20/25 - stable - asymptomatic, no metamorphopsia - no indication for surgery at this time - continue to monitor  6,7. Hypertensive retinopathy OU - discussed importance of tight BP control - continue to monitor  8. Pseudophakia OU  - s/p CE/IOL OU  - IOLs in good position, doing well  - continue to monitor  9. Ocular Hypertension OU  - IOP 10,9 on Brim BID OD  - continue Brimonidine bid OD, Simbrinza OU BID, and Timolol OU QAM   Ophthalmic Meds Ordered this visit:  No orders of the defined types were placed in this encounter.    No follow-ups on file.  There are no Patient Instructions on file for this visit.   Explained the diagnoses, plan, and follow up with the patient and they expressed understanding.  Patient expressed understanding of the importance of proper follow up care.   This document serves as a record of services personally  performed by Karie Chimera, MD, PhD. It was created on their behalf by Laurey Morale, COT an ophthalmic technician. The creation of this record is the provider's dictation and/or activities during the visit.    Electronically signed by:  Charlette Caffey, COT  05/04/23 7:31 AM  Karie Chimera, M.D., Ph.D. Diseases & Surgery of the Retina and Vitreous [facility]   Abbreviations: M myopia (nearsighted); A astigmatism; H hyperopia (farsighted); P presbyopia; Mrx spectacle prescription;  CTL contact lenses; OD right eye; OS left eye; OU both eyes  XT exotropia; ET esotropia; PEK punctate epithelial keratitis; PEE punctate epithelial erosions; DES dry eye syndrome; MGD meibomian gland dysfunction; ATs artificial tears; PFAT's preservative free artificial tears; NSC nuclear sclerotic cataract; PSC posterior subcapsular cataract; ERM epi-retinal membrane; PVD posterior vitreous detachment; RD retinal detachment; DM diabetes mellitus; DR diabetic retinopathy; NPDR non-proliferative diabetic retinopathy; PDR proliferative diabetic retinopathy; CSME clinically significant macular edema; DME diabetic macular edema; dbh dot blot hemorrhages; CWS cotton wool spot; POAG primary open angle glaucoma; C/D cup-to-disc ratio; HVF humphrey visual field; GVF goldmann visual field; OCT optical coherence tomography; IOP intraocular pressure; BRVO Branch retinal vein occlusion; CRVO central retinal vein occlusion; CRAO central retinal artery occlusion; BRAO branch retinal artery occlusion; RT retinal tear; SB scleral buckle; PPV pars plana vitrectomy; VH Vitreous hemorrhage; PRP panretinal laser photocoagulation; IVK intravitreal kenalog; VMT vitreomacular traction; MH Macular hole;  NVD neovascularization of the disc; NVE neovascularization elsewhere; AREDS age related eye disease study; ARMD age related macular degeneration; POAG primary open angle glaucoma; EBMD epithelial/anterior basement  membrane dystrophy; ACIOL anterior chamber intraocular lens; IOL intraocular lens; PCIOL posterior chamber intraocular lens; Phaco/IOL phacoemulsification with intraocular lens placement; PRK photorefractive keratectomy; LASIK laser assisted in situ keratomileusis; HTN hypertension; DM diabetes mellitus; COPD chronic obstructive pulmonary disease

## 2023-05-16 ENCOUNTER — Encounter (INDEPENDENT_AMBULATORY_CARE_PROVIDER_SITE_OTHER): Payer: No Typology Code available for payment source | Admitting: Ophthalmology

## 2023-05-16 DIAGNOSIS — Z794 Long term (current) use of insulin: Secondary | ICD-10-CM

## 2023-05-16 DIAGNOSIS — I10 Essential (primary) hypertension: Secondary | ICD-10-CM

## 2023-05-16 DIAGNOSIS — H35033 Hypertensive retinopathy, bilateral: Secondary | ICD-10-CM

## 2023-05-16 DIAGNOSIS — Z961 Presence of intraocular lens: Secondary | ICD-10-CM

## 2023-05-16 DIAGNOSIS — H35371 Puckering of macula, right eye: Secondary | ICD-10-CM

## 2023-05-16 DIAGNOSIS — H4311 Vitreous hemorrhage, right eye: Secondary | ICD-10-CM

## 2023-05-16 DIAGNOSIS — E113513 Type 2 diabetes mellitus with proliferative diabetic retinopathy with macular edema, bilateral: Secondary | ICD-10-CM

## 2023-05-16 DIAGNOSIS — H40051 Ocular hypertension, right eye: Secondary | ICD-10-CM

## 2023-05-16 DIAGNOSIS — Z7984 Long term (current) use of oral hypoglycemic drugs: Secondary | ICD-10-CM

## 2023-05-16 NOTE — Progress Notes (Signed)
 [facility] - Clinic Note  05/30/2023    CHIEF COMPLAINT Patient presents for Retina Follow Up   HISTORY OF PRESENT ILLNESS: Matthew Sellers is a 83 y.o. male who presents to the clinic today for:   HPI     Retina Follow Up   Patient presents with  Diabetic Retinopathy.  In both eyes.  This started 8 weeks ago.  I, the attending physician,  performed the HPI with the patient and updated documentation appropriately.        Comments   Patient here for 8 weeks retina follow up for PDR OU. Patient states vision is a little better. No eye pain. Lost 14 lbs. Sugar and glucose went down. Working on.       Last edited by Rennis Chris, MD on 05/30/2023 12:31 PM.    Pt states vision is stable   Referring physician: No referring provider defined for this encounter.  HISTORICAL INFORMATION:   Selected notes from the MEDICAL RECORD NUMBER [facility]EE:  Ocular Hx- PMH   CURRENT MEDICATIONS: Current Outpatient Medications (Ophthalmic Drugs)  Medication Sig   brimonidine (ALPHAGAN) 0.2 % ophthalmic solution Place 1 drop into the right eye 2 (two) times daily.   Brinzolamide-Brimonidine 1-0.2 % SUSP Place 1 drop into both eyes 2 (two) times daily.   carboxymethylcellulose (REFRESH PLUS) 0.5 % SOLN INSTILL 1 DROP IN BOTH EYES FIVE TIMES A DAY   latanoprost (XALATAN) 0.005 % ophthalmic solution Place 1 drop into both eyes at bedtime.   Latanoprost 0.005 % EMUL    timolol (TIMOPTIC-XR) 0.5 % ophthalmic gel-forming SMARTSIG:In Eye(s)   No current facility-administered medications for this visit. (Ophthalmic Drugs)   Current Outpatient Medications (Other)  Medication Sig   amLODipine (NORVASC) 10 MG tablet Take 10 mg by mouth daily.   aspirin EC 81 MG tablet Take 81 mg by mouth daily.   cefpodoxime (VANTIN) 200 MG tablet    insulin glargine (LANTUS) 100 UNIT/ML injection Inject 30 Units into the skin at bedtime.    insulin glargine-yfgn  (SEMGLEE) 100 UNIT/ML Pen INJECT 36 UNITS SUBCUTANEOUSLY DAILY FOR DIABETES (CONVERTED FROM LANTUS)   JARDIANCE 25 MG TABS tablet Take 1 tablet by mouth daily.   Krill Oil 300 MG CAPS Take 300 mg by mouth daily.   lidocaine (LIDODERM) 5 % Place 1 patch onto the skin daily. Remove & Discard patch within 12 hours or as directed by MD   lisinopril (ZESTRIL) 20 MG tablet Take 2 tablets by mouth daily.   lisinopril-hydrochlorothiazide (ZESTORETIC) 20-25 MG tablet Take 2 tablets by mouth every morning.   metFORMIN (GLUCOPHAGE) 1000 MG tablet Take 1,000 mg by mouth 2 (two) times daily with a meal.   pravastatin (PRAVACHOL) 20 MG tablet Take 20 mg by mouth at bedtime.   PRECISION XTRA TEST STRIPS test strip    predniSONE (DELTASONE) 10 MG tablet Take 3 tablets (30 mg total) by mouth daily.   fluticasone (FLONASE) 50 MCG/ACT nasal spray Place 2 sprays into both nostrils daily as needed for allergies or rhinitis.   gabapentin (NEURONTIN) 100 MG capsule Take 1 capsule by mouth 3 (three) times daily.   HYDROcodone-acetaminophen (NORCO/VICODIN) 5-325 MG tablet Take 1-2 tablets by mouth every 4 (four) hours as needed for moderate pain.   nitroGLYCERIN (NITROSTAT) 0.4 MG SL tablet Place 0.4 mg under the tongue every 5 (five) minutes as needed for chest pain.   No current facility-administered medications for this visit. (  Other)   REVIEW OF SYSTEMS: ROS   Positive for: Genitourinary, Endocrine, Cardiovascular, Eyes Negative for: Constitutional, Gastrointestinal, Neurological, Skin, Musculoskeletal, HENT, Respiratory, Psychiatric, Allergic/Imm, Heme/Lymph Last edited by Laddie Aquas, COA on 05/30/2023  8:47 AM.      ALLERGIES No Known Allergies  PAST MEDICAL HISTORY Past Medical History:  Diagnosis Date   Anemia    Bradycardia    CAD (coronary artery disease)    Chest tightness    Chronic kidney disease    Diabetes mellitus without complication (HCC)    Diabetic retinopathy (HCC)     Dyslipidemia    Dyspnea on exertion    Glaucoma    Hypertension    Obesity    Paroxysmal atrial fibrillation (HCC)    a. identified on device interrogation   Pneumonia    Past Surgical History:  Procedure Laterality Date   ACHILLES TENDON SURGERY     for rupture   CARDIAC CATHETERIZATION  07/08/2009   mild to mod . prox LAD 50% (Dr. Garen Lah)   CATARACT EXTRACTION     CHOLECYSTECTOMY N/A 10/11/2017   Procedure: LAPAROSCOPIC CHOLECYSTECTOMY WITH INTRAOPERATIVE CHOLANGIOGRAM;  Surgeon: Ovidio Kin, MD;  Location: [facility] OR;  Service: General;  Laterality: N/A;   DOPPLER ECHOCARDIOGRAPHY  04/10/2008   LVEF 70-80% ,norm   EYE SURGERY     PACEMAKER IMPLANT N/A 10/12/2017   Procedure: PACEMAKER IMPLANT;  Surgeon: Marinus Maw, MD;  Location: [facility] INVASIVE CV LAB;  Service: Cardiovascular;  Laterality: N/A;   stress myoview  04/09/2008   EF 54%; LV  norm   TEMPORARY PACEMAKER N/A 10/11/2017   Procedure: TEMPORARY PACEMAKER;  Surgeon: Marinus Maw, MD;  Location: [facility] INVASIVE CV LAB;  Service: Cardiovascular;  Laterality: N/A;   FAMILY HISTORY Family History  Problem Relation Age of Onset   Leukemia Mother    SOCIAL HISTORY Social History   Tobacco Use   Smoking status: Never   Smokeless tobacco: Never  Vaping Use   Vaping status: Never Used  Substance Use Topics   Alcohol use: No    Alcohol/week: 0.0 standard drinks of alcohol   Drug use: No       OPHTHALMIC EXAM:  Base Eye Exam     Visual Acuity (Snellen - Linear)       Right Left   Dist cc 20/25 20/25 -1   Dist ph cc NI NI    Correction: Glasses         Tonometry (Tonopen, 8:45 AM)       Right Left   Pressure 18 18         Pupils       Dark Light Shape React APD   Right 2 1 Round Brisk None   Left 2 1 Round Brisk None         Visual Fields (Counting fingers)       Left Right    Full Full         Extraocular Movement       Right Left    Full, Ortho Full, Ortho          Neuro/Psych     Oriented x3: Yes   Mood/Affect: Normal         Dilation     Both eyes: 1.0% Mydriacyl @ 8:45 AM           Slit Lamp and Fundus Exam     Slit Lamp Exam       Right Left   Lids/Lashes  Dermatochalasis - upper lid, mild MGD Dermatochalasis - upper lid, mild MGD   Conjunctiva/Sclera nasal pingeucula, Melanosis nasal pingeucula, Melanosis   Cornea 2+ Punctate epithelial erosions, well healed cataract wound, Arcus mild arcus, well healed cataract wound, 1-2+ Punctate epithelial erosions, Debris in tear film   Anterior Chamber deep and clear deep and clear   Iris round and moderately dilated, No NVI round and moderdilated to 4mm, No NVI   Lens PC IOL in good position PC IOL in good position   Anterior Vitreous Mild Vitreous syneresis, Posterior vitreous detachment, blood stained vitreous condensations -- improved Mild Vitreous syneresis, Posterior vitreous detachment, vitreous condensations, stable improvement in blood stained vitreous condensations         Fundus Exam       Right Left   Disc 2+Pallor, Sharp rim, +cupping, punctate heme at 1200 mild Pallor, Sharp rim, +cupping, focal PPP temporal, vascular loops, early NVD -- stably improved   C/D Ratio 0.7 0.5   Macula Flat, good foveal reflex, scattered Microaneurysms, trace cystic changes -- slightly improved, RPE mottling and clumping Flat, good foveal reflex, rare MA/DBH, mild cystic changes temporal mac- slightly improved   Vessels attenuated, mild tortuosity, mild fibrosis along IT arcades attenuated, Tortuous, no obvious NV, Copper wiring   Periphery Attached, +NV along inferior arcades and nasal midzone - improved, 360 MA/DBH, good 360 PRP changes -- with good fill in, scattered fibrosis, ?shallow schisis nasal periphery Attached, 360 MA/DBH, focal exudates nasal midzone, mild PRP laser changes 360           Refraction     Wearing Rx       Sphere Cylinder Axis Add   Right -0.75 +1.25 004 +2.75    Left -0.75 +1.25 004 +2.75    Type: PAL           IMAGING AND PROCEDURES  Imaging and Procedures for 05/30/2023  OCT, Retina - OU - Both Eyes       Right Eye Quality was good. Central Foveal Thickness: 284. Progression has been stable. Findings include no IRF, no SRF, abnormal foveal contour, epiretinal membrane, preretinal fibrosis (Persistent vitreous opacities settled inferiorly, pre-retinal fibrosis inferiorly -- stable, persistent cystic changes temporal fovea and mac).   Left Eye Quality was borderline. Central Foveal Thickness: 267. Progression has improved. Findings include normal foveal contour, no SRF, intraretinal hyper-reflective material, intraretinal fluid (Mild interval improvement in IRF / IRHM temporal macula, stable improvement in vitreous opacities).   Notes *Images captured and stored on drive  Diagnosis / Impression:  OD: Persistent vitreous opacities settled inferiorly, pre-retinal fibrosis inferiorly -- stable, persistent cystic changes temporal fovea and mac OS: Mild interval improvement in IRF / IRHM temporal macula, stable improvement in vitreous opacities  Clinical management:  See below  Abbreviations: NFP - Normal foveal profile. CME - cystoid macular edema. PED - pigment epithelial detachment. IRF - intraretinal fluid. SRF - subretinal fluid. EZ - ellipsoid zone. ERM - epiretinal membrane. ORA - outer retinal atrophy. ORT - outer retinal tubulation. SRHM - subretinal hyper-reflective material. IRHM - intraretinal hyper-reflective material      Intravitreal Injection, Pharmacologic Agent - OD - Right Eye       Time Out 05/30/2023. 9:43 AM. Confirmed correct patient, procedure, site, and patient consented.   Anesthesia Topical anesthesia was used. Anesthetic medications included Lidocaine 2%, Proparacaine 0.5%.   Procedure Preparation included 5% betadine to ocular surface, eyelid speculum. A (32g) needle was used.   Injection: 1.25 mg  Bevacizumab 1.25mg /0.49ml  Route: Intravitreal, Site: Right Eye   NDC: P3213405, Lot: [id number], Expiration date: 06/22/2023   Post-op Post injection exam found visual acuity of at least counting fingers. The patient tolerated the procedure well. There were no complications. The patient received written and verbal post procedure care education. Post injection medications were not given.      Intravitreal Injection, Pharmacologic Agent - OS - Left Eye       Time Out 05/30/2023. 9:43 AM. Confirmed correct patient, procedure, site, and patient consented.   Anesthesia Topical anesthesia was used. Anesthetic medications included Lidocaine 2%, Proparacaine 0.5%.   Procedure Preparation included 5% betadine to ocular surface, eyelid speculum. A supplied (32g) needle was used.   Injection: 1.25 mg Bevacizumab 1.25mg /0.44ml   Route: Intravitreal, Site: Left Eye   NDC: P3213405, Lot: [id number], Expiration date: 07/09/2023   Post-op Post injection exam found visual acuity of at least counting fingers. The patient tolerated the procedure well. There were no complications. The patient received written and verbal post procedure care education. Post injection medications were not given.             ASSESSMENT/PLAN:    ICD-10-CM   1. Proliferative diabetic retinopathy of both eyes with macular edema associated with type 2 diabetes mellitus (HCC)  E11.3513 OCT, Retina - OU - Both Eyes    Intravitreal Injection, Pharmacologic Agent - OD - Right Eye    Intravitreal Injection, Pharmacologic Agent - OS - Left Eye    Bevacizumab (AVASTIN) SOLN 1.25 mg    Bevacizumab (AVASTIN) SOLN 1.25 mg    2. Current use of insulin (HCC)  Z79.4     3. Long term (current) use of oral hypoglycemic drugs  Z79.84     4. Vitreous hemorrhage, right eye (HCC)  H43.11     5. Epiretinal membrane (ERM) of right eye  H35.371     6. Essential hypertension  I10     7. Hypertensive retinopathy of both eyes   H35.033     8. Pseudophakia, both eyes  Z96.1     9. Ocular hypertension of right eye  H40.051      1-4. Proliferative diabetic retinopathy OU w/ vitreous hemorrhage, OD   - A1C 7.4 (09.12.24) - s/p IVA OD #1 (12.19.22), #2 (01.16.23), #3 (02.16.23), #4 (04.04.23), #5 (09.13.23), #6 (10.11.23), #7 (11.27.23), #8 (01.29.24), #9 (03.25.24), #10 (05.13.24), #11 (07.02.24), #12 (08.20.24), #13 (10.22.24), #14 (12.17.24) - s/p IVA OS #1 (09.13.23), #2 (10.11.23), #3 (11.27.23), #4 (01.29.24), #5 (03.25.24), #6 (05.13.24), #7 (07.02.24), #8 (08.20.24), #9 (10.22.24), #10 (12.17.24)  - s/p PRP OD (02.27.23), (09.19.23)  - s/p PRP OS (05.28.24)  - referred by [facility] -- formerly followed by Dr. Clarisa Kindred - BCVA 20/25 OU -- stable - FA (09.05.23) OD: Perisistent vitreous opacities settled inferiorly inferiorly, pre-retinal fibrosis inferiorly -- stable, mild interval increase in cystic changes temporal fovea and mac OS: Mild NVD, vascular non-perfusion and perivascular leakage temporal periphery - OCT today shows OD: Persistent vitreous opacities settled inferiorly, pre-retinal fibrosis inferiorly -- stable, persistent cystic changes temporal fovea and mac; OS: Mild interval improvement in IRF / IRHM temporal macula, stable improvement in vitreous opacities at 10 wks - recommend IVA OU (OD #15 and OS #11) today, 02.25.25 with follow up in 9-10 weeks - pt wishes to proceed with injections - RBA of procedure discussed, questions answered - see procedure note  - IVA informed consent obtained and signed, 10.22.24 (OU) - VH precautions reviewed -- minimize activities, keep head elevated, avoid ASA/NSAIDs/blood  thinners as able  - f/u 9-10 weeks, DFE, OCT, possible injection(s)  5. Epiretinal membrane, right eye  - mild ERM OD - BCVA 20/25 - stable - asymptomatic, no metamorphopsia - no indication for surgery at this time - continue to monitor  6,7. Hypertensive retinopathy OU - discussed importance of  tight BP control - continue to monitor  8. Pseudophakia OU  - s/p CE/IOL OU  - IOLs in good position, doing well  - continue to monitor  9. Ocular Hypertension OU  - IOP 18 OUon Brim BID OD  - continue Brimonidine bid OD, Simbrinza OU BID, and Timolol OU QAM   Ophthalmic Meds Ordered this visit:  Meds ordered this encounter  Medications   Bevacizumab (AVASTIN) SOLN 1.25 mg   Bevacizumab (AVASTIN) SOLN 1.25 mg     Return for f/u 9-10 weeks, PDR OU, DFE, OCT, Possible Injxn.  There are no Patient Instructions on file for this visit.   Explained the diagnoses, plan, and follow up with the patient and they expressed understanding.  Patient expressed understanding of the importance of proper follow up care.   This document serves as a record of services personally performed by Karie Chimera, MD, PhD. It was created on their behalf by Laurey Morale, COT an ophthalmic technician. The creation of this record is the provider's dictation and/or activities during the visit.    Electronically signed by:  Charlette Caffey, COT  05/30/23 10:35 PM  This document serves as a record of services personally performed by Karie Chimera, MD, PhD. It was created on their behalf by Glee Arvin. Manson Passey, OA an ophthalmic technician. The creation of this record is the provider's dictation and/or activities during the visit.    Electronically signed by: Glee Arvin. Manson Passey, OA 05/30/23 10:35 PM   Karie Chimera, M.D., Ph.D. Diseases & Surgery of the Retina and Vitreous [facility]  I have reviewed the above documentation for accuracy and completeness, and I agree with the above. Karie Chimera, M.D., Ph.D. 05/30/23 10:38 PM  Abbreviations: M myopia (nearsighted); A astigmatism; H hyperopia (farsighted); P presbyopia; Mrx spectacle prescription;  CTL contact lenses; OD right eye; OS left eye; OU both eyes  XT exotropia; ET esotropia; PEK punctate epithelial keratitis; PEE  punctate epithelial erosions; DES dry eye syndrome; MGD meibomian gland dysfunction; ATs artificial tears; PFAT's preservative free artificial tears; NSC nuclear sclerotic cataract; PSC posterior subcapsular cataract; ERM epi-retinal membrane; PVD posterior vitreous detachment; RD retinal detachment; DM diabetes mellitus; DR diabetic retinopathy; NPDR non-proliferative diabetic retinopathy; PDR proliferative diabetic retinopathy; CSME clinically significant macular edema; DME diabetic macular edema; dbh dot blot hemorrhages; CWS cotton wool spot; POAG primary open angle glaucoma; C/D cup-to-disc ratio; HVF humphrey visual field; GVF goldmann visual field; OCT optical coherence tomography; IOP intraocular pressure; BRVO Branch retinal vein occlusion; CRVO central retinal vein occlusion; CRAO central retinal artery occlusion; BRAO branch retinal artery occlusion; RT retinal tear; SB scleral buckle; PPV pars plana vitrectomy; VH Vitreous hemorrhage; PRP panretinal laser photocoagulation; IVK intravitreal kenalog; VMT vitreomacular traction; MH Macular hole;  NVD neovascularization of the disc; NVE neovascularization elsewhere; AREDS age related eye disease study; ARMD age related macular degeneration; POAG primary open angle glaucoma; EBMD epithelial/anterior basement membrane dystrophy; ACIOL anterior chamber intraocular lens; IOL intraocular lens; PCIOL posterior chamber intraocular lens; Phaco/IOL phacoemulsification with intraocular lens placement; PRK photorefractive keratectomy; LASIK laser assisted in situ keratomileusis; HTN hypertension; DM diabetes mellitus; COPD chronic obstructive pulmonary disease

## 2023-05-22 ENCOUNTER — Encounter (HOSPITAL_COMMUNITY): Payer: Self-pay | Admitting: *Deleted

## 2023-05-22 ENCOUNTER — Ambulatory Visit (HOSPITAL_COMMUNITY)
Admission: EM | Admit: 2023-05-22 | Discharge: 2023-05-22 | Disposition: A | Discharge status: Home / Self Care | Payer: No Typology Code available for payment source | Attending: Family Medicine | Admitting: Family Medicine

## 2023-05-22 ENCOUNTER — Other Ambulatory Visit: Payer: Self-pay

## 2023-05-22 ENCOUNTER — Ambulatory Visit (INDEPENDENT_AMBULATORY_CARE_PROVIDER_SITE_OTHER): Payer: No Typology Code available for payment source

## 2023-05-22 DIAGNOSIS — M25562 Pain in left knee: Secondary | ICD-10-CM | POA: Diagnosis not present

## 2023-05-22 DIAGNOSIS — M109 Gout, unspecified: Secondary | ICD-10-CM | POA: Diagnosis not present

## 2023-05-22 MED ORDER — PREDNISONE 10 MG PO TABS: 30.0000 mg | ORAL_TABLET | Freq: Every day | ORAL | 0 refills | Status: AC

## 2023-05-22 NOTE — ED Triage Notes (Signed)
 Pt reports pain and swelling to Lt knee . Skin feels warm to touch. No injury to knee.

## 2023-05-22 NOTE — ED Provider Notes (Signed)
 [facility]    CSN: [id number] Arrival date & time: 05/22/23  4098      History   Chief Complaint Chief Complaint  Patient presents with   Knee Pain    HPI Matthew Sellers is a 83 y.o. male.   Patient presenting with acute onset left knee pain.  Patient states that he cannot recall any injury or inciting event to the left PE.  Patient states that the pain started last Thursday.  Patient notes that he is currently on a Mediterranean diet and is eating lots of shrimp as well as lamb and chicken.  Patient also notes that he does not drink a ton of water.  Patient has never had any history of gout or no other concerns at this time.  Patient denies any systemic symptoms like fevers or chills and notes that all his pain is located to his knee.   Knee Pain   Past Medical History:  Diagnosis Date   Anemia    Bradycardia    CAD (coronary artery disease)    Chest tightness    Chronic kidney disease    Diabetes mellitus without complication (HCC)    Diabetic retinopathy (HCC)    Dyslipidemia    Dyspnea on exertion    Glaucoma    Hypertension    Obesity    Paroxysmal atrial fibrillation (HCC)    a. identified on device interrogation   Pneumonia     Patient Active Problem List   Diagnosis Date Noted   Pacemaker 10/30/2018   Paroxysmal atrial fibrillation (HCC) 10/30/2018   Complete heart block (HCC)    Abdominal pain 10/09/2017   Fatty liver 10/09/2017   CKD (chronic kidney disease) stage 3, GFR 30-59 ml/min (HCC) 10/09/2017   Bradycardia 10/09/2017   CAP (community acquired pneumonia) 06/29/2014   Diabetes (HCC) 06/29/2014   Hypertension 06/29/2014   Hyperlipidemia 06/29/2014   Hypokalemia 06/29/2014   Anemia 06/29/2014   Pneumonia 06/29/2014    Past Surgical History:  Procedure Laterality Date   ACHILLES TENDON SURGERY     for rupture   CARDIAC CATHETERIZATION  07/08/2009   mild to mod . prox LAD 50% (Dr. Garen Lah)   CATARACT EXTRACTION      CHOLECYSTECTOMY N/A 10/11/2017   Procedure: LAPAROSCOPIC CHOLECYSTECTOMY WITH INTRAOPERATIVE CHOLANGIOGRAM;  Surgeon: Ovidio Kin, MD;  Location: [facility] OR;  Service: General;  Laterality: N/A;   DOPPLER ECHOCARDIOGRAPHY  04/10/2008   LVEF 70-80% ,norm   EYE SURGERY     PACEMAKER IMPLANT N/A 10/12/2017   Procedure: PACEMAKER IMPLANT;  Surgeon: Marinus Maw, MD;  Location: [facility];  Service: Cardiovascular;  Laterality: N/A;   stress myoview  04/09/2008   EF 54%; LV  norm   TEMPORARY PACEMAKER N/A 10/11/2017   Procedure: TEMPORARY PACEMAKER;  Surgeon: Marinus Maw, MD;  Location: [facility];  Service: Cardiovascular;  Laterality: N/A;       Home Medications    Prior to Admission medications   Medication Sig Start Date End Date Taking? Authorizing Provider  amLODipine (NORVASC) 10 MG tablet Take 10 mg by mouth daily.   Yes [provider]  aspirin EC 81 MG tablet Take 81 mg by mouth daily.   Yes [provider]  brimonidine (ALPHAGAN) 0.2 % ophthalmic solution Place 1 drop into the right eye 2 (two) times daily. 12/28/22  Yes Rennis Chris, MD  Brinzolamide-Brimonidine 1-0.2 % SUSP Place 1 drop into both eyes 2 (two) times daily.  Yes [provider]  carboxymethylcellulose (REFRESH PLUS) 0.5 % SOLN INSTILL 1 DROP IN BOTH EYES FIVE TIMES A DAY 12/16/20  Yes [provider]  JARDIANCE 25 MG TABS tablet Take 1 tablet by mouth daily. 07/15/18  Yes [provider]  Boris Lown Oil 300 MG CAPS Take 300 mg by mouth daily.   Yes [provider]  latanoprost (XALATAN) 0.005 % ophthalmic solution Place 1 drop into both eyes at bedtime.   Yes [provider]  lidocaine (LIDODERM) 5 % Place 1 patch onto the skin daily. Remove & Discard patch within 12 hours or as directed by MD 01/16/18  Yes McDonald, Mia A, PA-C  lisinopril-hydrochlorothiazide (ZESTORETIC) 20-25 MG tablet Take 2 tablets by mouth every morning. 12/29/20  Yes  [provider]  metFORMIN (GLUCOPHAGE) 1000 MG tablet Take 1,000 mg by mouth 2 (two) times daily with a meal.   Yes [provider]  pravastatin (PRAVACHOL) 20 MG tablet Take 20 mg by mouth at bedtime.   Yes [provider]  PRECISION XTRA TEST STRIPS test strip  02/14/21  Yes [provider]  predniSONE (DELTASONE) 10 MG tablet Take 3 tablets (30 mg total) by mouth daily. 05/22/23  Yes Brenton Grills, MD  timolol (TIMOPTIC-XR) 0.5 % ophthalmic gel-forming SMARTSIG:In Eye(s) 02/14/21  Yes [provider]  cefpodoxime (VANTIN) 200 MG tablet  08/28/20   [provider]  fluticasone (FLONASE) 50 MCG/ACT nasal spray Place 2 sprays into both nostrils daily as needed for allergies or rhinitis.    [provider]  gabapentin (NEURONTIN) 100 MG capsule Take 1 capsule by mouth 3 (three) times daily. 01/26/18   [provider]  HYDROcodone-acetaminophen (NORCO/VICODIN) 5-325 MG tablet Take 1-2 tablets by mouth every 4 (four) hours as needed for moderate pain. 10/14/17   Leatha Gilding, MD  insulin glargine (LANTUS) 100 UNIT/ML injection Inject 30 Units into the skin at bedtime.     [provider]  insulin glargine-yfgn (SEMGLEE) 100 UNIT/ML Pen INJECT 36 UNITS SUBCUTANEOUSLY DAILY FOR DIABETES (CONVERTED FROM LANTUS) 01/28/21   [provider]  Latanoprost 0.005 % EMUL     [provider]  lisinopril (ZESTRIL) 20 MG tablet Take 2 tablets by mouth daily.    [provider]  nitroGLYCERIN (NITROSTAT) 0.4 MG SL tablet Place 0.4 mg under the tongue every 5 (five) minutes as needed for chest pain.    [provider]    Family History Family History  Problem Relation Age of Onset   Leukemia Mother     Social History Social History   Tobacco Use   Smoking status: Never   Smokeless tobacco: Never  Vaping Use   Vaping status: Never Used  Substance Use Topics   Alcohol use: No     Alcohol/week: 0.0 standard drinks of alcohol   Drug use: No     Allergies   Patient has no known allergies.   Review of Systems Review of Systems   Physical Exam Triage Vital Signs ED Triage Vitals  Encounter Vitals Group     BP 05/22/23 1034 (!) 157/88     Systolic BP Percentile --      Diastolic BP Percentile --      Pulse Rate 05/22/23 1034 93     Resp 05/22/23 1034 18     Temp 05/22/23 1034 99.3 F (37.4 C)     Temp src --      SpO2 05/22/23 1034 95 %  Weight --      Height --      Head Circumference --      Peak Flow --      Pain Score 05/22/23 1026 8     Pain Loc --      Pain Education --      Exclude from Growth Chart --    No data found.  Updated Vital Signs BP (!) 157/88   Pulse 93   Temp 99.3 F (37.4 C)   Resp 18   SpO2 95%   Visual Acuity Right Eye Distance:   Left Eye Distance:   Bilateral Distance:    Right Eye Near:   Left Eye Near:    Bilateral Near:     Physical Exam Left knee: Inspection reveals swelling of the left knee as well as some erythema and warmth.  There is tenderness to palpation throughout the knee with the most prominent aspect being along the patella on the lateral aspect. ROM is full though pain noted on extension and flexion.  Strength is decreased due to pain.  UC Treatments / Results  Labs (all labs ordered are listed, but only abnormal results are displayed) Labs Reviewed - No data to display  EKG   Radiology No results found.  Procedures Procedures (including critical care time)  Medications Ordered in UC Medications - No data to display  Initial Impression / Assessment and Plan / UC Course  I have reviewed the triage vital signs and the nursing notes.  Pertinent labs & imaging results that were available during my care of the patient were reviewed by me and considered in my medical decision making (see chart for details).     Patient presenting with gout of the left knee.  Discussed with patient  that at this time, we will go ahead and treat with a steroid, patient does have a history of CKD.  Also discussed with patient that he will likely need to make dietary changes and not sure if a preventative medication is best for him at this time given his history of kidney disease.  As it is patient's first episode of gout, increasing hydration and dietary changes should be enough to prevent this from occurring in the future.  Both patient and wife are agreeable with this plan. Final Clinical Impressions(s) / UC Diagnoses   Final diagnoses:  Acute gout of left knee, unspecified cause     Discharge Instructions      Please take your prednisone 30mg  (3 pills a day)       ED Prescriptions     Medication Sig Dispense Auth. Provider   predniSONE (DELTASONE) 10 MG tablet Take 3 tablets (30 mg total) by mouth daily. 15 tablet Brenton Grills, MD      PDMP not reviewed this encounter.   Brenton Grills, MD 05/22/23 [phone]

## 2023-05-22 NOTE — Discharge Instructions (Addendum)
 Please take your prednisone 30mg  (3 pills a day)   If you feel like your symptoms are still not improving despite taking the steroids, please make an appointment with [place] at the sports medicine clinic.

## 2023-05-30 ENCOUNTER — Ambulatory Visit (INDEPENDENT_AMBULATORY_CARE_PROVIDER_SITE_OTHER): Payer: No Typology Code available for payment source | Admitting: Ophthalmology

## 2023-05-30 ENCOUNTER — Encounter (INDEPENDENT_AMBULATORY_CARE_PROVIDER_SITE_OTHER): Payer: Self-pay | Admitting: Ophthalmology

## 2023-05-30 DIAGNOSIS — I10 Essential (primary) hypertension: Secondary | ICD-10-CM

## 2023-05-30 DIAGNOSIS — H4311 Vitreous hemorrhage, right eye: Secondary | ICD-10-CM

## 2023-05-30 DIAGNOSIS — H35033 Hypertensive retinopathy, bilateral: Secondary | ICD-10-CM

## 2023-05-30 DIAGNOSIS — E113513 Type 2 diabetes mellitus with proliferative diabetic retinopathy with macular edema, bilateral: Secondary | ICD-10-CM | POA: Diagnosis not present

## 2023-05-30 DIAGNOSIS — H40051 Ocular hypertension, right eye: Secondary | ICD-10-CM

## 2023-05-30 DIAGNOSIS — Z7984 Long term (current) use of oral hypoglycemic drugs: Secondary | ICD-10-CM

## 2023-05-30 DIAGNOSIS — H35371 Puckering of macula, right eye: Secondary | ICD-10-CM

## 2023-05-30 DIAGNOSIS — Z794 Long term (current) use of insulin: Secondary | ICD-10-CM | POA: Diagnosis not present

## 2023-05-30 DIAGNOSIS — Z961 Presence of intraocular lens: Secondary | ICD-10-CM

## 2023-05-30 MED ORDER — BEVACIZUMAB CHEMO INJECTION 1.25MG/0.05ML SYRINGE FOR KALEIDOSCOPE
1.2500 mg | INTRAVITREAL | Status: AC | PRN
  Administered 2023-05-30: 1.25 mg via INTRAVITREAL

## 2023-07-25 NOTE — Progress Notes (Signed)
 [facility] - Clinic Note  08/08/2023    CHIEF COMPLAINT Patient presents for Retina Follow Up   HISTORY OF PRESENT ILLNESS: Matthew Sellers is a 82 y.o. male who presents to the clinic today for:   HPI     Retina Follow Up   Patient presents with  Diabetic Retinopathy.  In both eyes.  Severity is moderate.  Duration of 10 weeks.  Since onset it is stable.  I, the attending physician,  performed the HPI with the patient and updated documentation appropriately.        Comments   Pt here for 10 wk ret f/u PDR OU. Pt states VA is the same, no changes.       Last edited by Ronelle Coffee, MD on 08/08/2023  8:46 AM.     Pt is using brim BID OD only, Simbrinza  BID OU and timolol OU in the morning, he has tried getting an appt at the [facility] to check his pressure, but he can't get in with them yet   Referring physician: No referring provider defined for this encounter.  HISTORICAL INFORMATION:   Selected notes from the MEDICAL RECORD NUMBER [facility] LEE:  Ocular Hx- PMH   CURRENT MEDICATIONS: Current Outpatient Medications (Ophthalmic Drugs)  Medication Sig   brimonidine  (ALPHAGAN ) 0.2 % ophthalmic solution Place 1 drop into the right eye 2 (two) times daily.   Brinzolamide -Brimonidine  1-0.2 % SUSP Place 1 drop into both eyes 2 (two) times daily.   carboxymethylcellulose (REFRESH PLUS) 0.5 % SOLN INSTILL 1 DROP IN BOTH EYES FIVE TIMES A DAY   latanoprost  (XALATAN ) 0.005 % ophthalmic solution Place 1 drop into both eyes at bedtime.   Latanoprost  0.005 % EMUL    timolol (TIMOPTIC-XR) 0.5 % ophthalmic gel-forming SMARTSIG:In Eye(s)   No current facility-administered medications for this visit. (Ophthalmic Drugs)   Current Outpatient Medications (Other)  Medication Sig   amLODipine  (NORVASC ) 10 MG tablet Take 10 mg by mouth daily.   aspirin  EC 81 MG tablet Take 81 mg by mouth daily.   cefpodoxime  (VANTIN ) 200 MG tablet    insulin  glargine  (LANTUS ) 100 UNIT/ML injection Inject 30 Units into the skin at bedtime.    insulin  glargine-yfgn (SEMGLEE ) 100 UNIT/ML Pen INJECT 36 UNITS SUBCUTANEOUSLY DAILY FOR DIABETES (CONVERTED FROM LANTUS )   JARDIANCE 25 MG TABS tablet Take 1 tablet by mouth daily.   Krill Oil 300 MG CAPS Take 300 mg by mouth daily.   lidocaine  (LIDODERM ) 5 % Place 1 patch onto the skin daily. Remove & Discard patch within 12 hours or as directed by MD   lisinopril  (ZESTRIL ) 20 MG tablet Take 2 tablets by mouth daily.   lisinopril -hydrochlorothiazide  (ZESTORETIC ) 20-25 MG tablet Take 2 tablets by mouth every morning.   metFORMIN  (GLUCOPHAGE ) 1000 MG tablet Take 1,000 mg by mouth 2 (two) times daily with a meal.   pravastatin  (PRAVACHOL ) 20 MG tablet Take 20 mg by mouth at bedtime.   PRECISION XTRA TEST STRIPS test strip    predniSONE  (DELTASONE ) 10 MG tablet Take 3 tablets (30 mg total) by mouth daily.   fluticasone (FLONASE) 50 MCG/ACT nasal spray Place 2 sprays into both nostrils daily as needed for allergies or rhinitis.   gabapentin (NEURONTIN) 100 MG capsule Take 1 capsule by mouth 3 (three) times daily.   HYDROcodone -acetaminophen  (NORCO/VICODIN) 5-325 MG tablet Take 1-2 tablets by mouth every 4 (four) hours as needed for moderate pain.   nitroGLYCERIN  (  NITROSTAT ) 0.4 MG SL tablet Place 0.4 mg under the tongue every 5 (five) minutes as needed for chest pain.   No current facility-administered medications for this visit. (Other)   REVIEW OF SYSTEMS: ROS   Positive for: Genitourinary, Endocrine, Cardiovascular, Eyes Negative for: Constitutional, Gastrointestinal, Neurological, Skin, Musculoskeletal, HENT, Respiratory, Psychiatric, Allergic/Imm, Heme/Lymph Last edited by Anthony Bateman, COT on 08/08/2023  8:30 AM.       ALLERGIES No Known Allergies  PAST MEDICAL HISTORY Past Medical History:  Diagnosis Date   Anemia    Bradycardia    CAD (coronary artery disease)    Chest tightness    Chronic  kidney disease    Diabetes mellitus without complication (HCC)    Diabetic retinopathy (HCC)    Dyslipidemia    Dyspnea on exertion    Glaucoma    Hypertension    Obesity    Paroxysmal atrial fibrillation (HCC)    a. identified on device interrogation   Pneumonia    Past Surgical History:  Procedure Laterality Date   ACHILLES TENDON SURGERY     for rupture   CARDIAC CATHETERIZATION  07/08/2009   mild to mod . prox LAD 50% (Dr. Mara Seminole)   CATARACT EXTRACTION     CHOLECYSTECTOMY N/A 10/11/2017   Procedure: LAPAROSCOPIC CHOLECYSTECTOMY WITH INTRAOPERATIVE CHOLANGIOGRAM;  Surgeon: Juanita Norlander, MD;  Location: [facility] OR;  Service: General;  Laterality: N/A;   DOPPLER ECHOCARDIOGRAPHY  04/10/2008   LVEF 70-80% ,norm   EYE SURGERY     PACEMAKER IMPLANT N/A 10/12/2017   Procedure: PACEMAKER IMPLANT;  Surgeon: Tammie Fall, MD;  Location: [facility] INVASIVE CV LAB;  Service: Cardiovascular;  Laterality: N/A;   stress myoview  04/09/2008   EF 54%; LV  norm   TEMPORARY PACEMAKER N/A 10/11/2017   Procedure: TEMPORARY PACEMAKER;  Surgeon: Tammie Fall, MD;  Location: [facility] INVASIVE CV LAB;  Service: Cardiovascular;  Laterality: N/A;   FAMILY HISTORY Family History  Problem Relation Age of Onset   Leukemia Mother    SOCIAL HISTORY Social History   Tobacco Use   Smoking status: Never   Smokeless tobacco: Never  Vaping Use   Vaping status: Never Used  Substance Use Topics   Alcohol use: No    Alcohol/week: 0.0 standard drinks of alcohol   Drug use: No       OPHTHALMIC EXAM:  Base Eye Exam     Visual Acuity (Snellen - Linear)       Right Left   Dist cc 20/25 -2 20/25 -1   Dist ph cc NI NI    Correction: Glasses         Tonometry (Tonopen, 8:35 AM)       Right Left   Pressure 22 23         Pupils       Dark Light Shape React APD   Right 2 1 Round Brisk None   Left 2 1 Round Brisk None         Visual Fields (Counting fingers)       Left Right    Full Full          Extraocular Movement       Right Left    Full, Ortho Full, Ortho         Neuro/Psych     Oriented x3: Yes   Mood/Affect: Normal         Dilation     Both eyes: 1.0% Mydriacyl, 2.5% Phenylephrine @ 8:36 AM  Slit Lamp and Fundus Exam     Slit Lamp Exam       Right Left   Lids/Lashes Dermatochalasis - upper lid, mild MGD Dermatochalasis - upper lid, mild MGD   Conjunctiva/Sclera nasal pingeucula, Melanosis nasal pingeucula, Melanosis   Cornea 2+ Punctate epithelial erosions, well healed cataract wound, Arcus mild arcus, well healed cataract wound, 1-2+ Punctate epithelial erosions, Debris in tear film   Anterior Chamber deep and clear deep and clear   Iris round and moderately dilated, No NVI round and moderdilated to 4mm, No NVI   Lens PC IOL in good position PC IOL in good position   Anterior Vitreous Mild Vitreous syneresis, Posterior vitreous detachment, blood stained vitreous condensations -- improved Mild Vitreous syneresis, Posterior vitreous detachment, vitreous condensations, stable improvement in blood stained vitreous condensations         Fundus Exam       Right Left   Disc 2+Pallor, Sharp rim, +cupping, punctate heme at 1200 -- improved mild Pallor, Sharp rim, +cupping, focal PPP temporal, vascular loops, early NVD -- stably improved   C/D Ratio 0.8 0.5   Macula Flat, good foveal reflex, scattered Microaneurysms, trace cystic changes -- slightly increased, RPE mottling and clumping Flat, good foveal reflex, rare MA/DBH, mild cystic changes temporal mac -- slightly increased   Vessels attenuated, mild tortuosity, mild fibrosis along IT arcades attenuated, Tortuous, no obvious NV, Copper wiring   Periphery Attached, +NV along inferior arcades and nasal midzone - improved, 360 MA/DBH, good 360 PRP changes -- with good fill in, scattered fibrosis, ?shallow schisis nasal periphery Attached, 360 MA/DBH, focal exudates nasal midzone, mild PRP  laser changes 360           Refraction     Wearing Rx       Sphere Cylinder Axis Add   Right -0.75 +1.25 004 +2.75   Left -0.75 +1.25 004 +2.75    Type: PAL           IMAGING AND PROCEDURES  Imaging and Procedures for 08/08/2023  OCT, Retina - OU - Both Eyes        Right Eye Quality was good. Central Foveal Thickness: 282. Progression has worsened. Findings include no IRF, no SRF, abnormal foveal contour, epiretinal membrane, preretinal fibrosis (Persistent vitreous opacities settled inferiorly, pre-retinal fibrosis inferiorly -- stable, persistent cystic changes temporal fovea and mac -- slightly increased).   Left Eye Quality was borderline. Central Foveal Thickness: 267. Progression has worsened. Findings include normal foveal contour, no SRF, intraretinal hyper-reflective material, intraretinal fluid (Mild interval increase in IRF / IRHM temporal macula, stable improvement in vitreous opacities).   Notes  *Images captured and stored on drive  Diagnosis / Impression:  OD: Persistent vitreous opacities settled inferiorly, pre-retinal fibrosis inferiorly -- stable, persistent cystic changes temporal fovea and mac -- slightly increased OS: Mild interval increase in IRF / IRHM temporal macula, stable improvement in vitreous opacities  Clinical management:  See below  Abbreviations: NFP - Normal foveal profile. CME - cystoid macular edema. PED - pigment epithelial detachment. IRF - intraretinal fluid. SRF - subretinal fluid. EZ - ellipsoid zone. ERM - epiretinal membrane. ORA - outer retinal atrophy. ORT - outer retinal tubulation. SRHM - subretinal hyper-reflective material. IRHM - intraretinal hyper-reflective material      Intravitreal Injection, Pharmacologic Agent - OD - Right Eye       Time Out 08/08/2023. 8:50 AM. Confirmed correct patient, procedure, site, and patient consented.   Anesthesia Topical anesthesia was  used. Anesthetic medications included  Lidocaine  2%, Proparacaine 0.5%.   Procedure Preparation included 5% betadine to ocular surface, eyelid speculum. A (32g) needle was used.   Injection: 2 mg aflibercept 2 MG/0.05ML   Route: Intravitreal, Site: Right Eye   NDC: D2246706, Lot: [id number], Expiration date: 08/01/2024, Waste: 0 mL   Post-op Post injection exam found visual acuity of at least counting fingers. The patient tolerated the procedure well. There were no complications. The patient received written and verbal post procedure care education. Post injection medications were not given.      Intravitreal Injection, Pharmacologic Agent - OS - Left Eye       Time Out 08/08/2023. 8:50 AM. Confirmed correct patient, procedure, site, and patient consented.   Anesthesia Topical anesthesia was used. Anesthetic medications included Lidocaine  2%, Proparacaine 0.5%.   Procedure Preparation included 5% betadine to ocular surface, eyelid speculum. A (32g) needle was used.   Injection: 2 mg aflibercept 2 MG/0.05ML   Route: Intravitreal, Site: Left Eye   NDC: D2246706, Lot: [id number], Expiration date: 07/31/2024, Waste: 0 mL   Post-op Post injection exam found visual acuity of at least counting fingers. The patient tolerated the procedure well. There were no complications. The patient received written and verbal post procedure care education. Post injection medications were not given.            ASSESSMENT/PLAN:    ICD-10-CM   1. Proliferative diabetic retinopathy of both eyes with macular edema associated with type 2 diabetes mellitus (HCC)  E11.3513 OCT, Retina - OU - Both Eyes    Intravitreal Injection, Pharmacologic Agent - OD - Right Eye    Intravitreal Injection, Pharmacologic Agent - OS - Left Eye    aflibercept (EYLEA) SOLN 2 mg    aflibercept (EYLEA) SOLN 2 mg    2. Current use of insulin  (HCC)  Z79.4     3. Long term (current) use of oral hypoglycemic drugs  Z79.84     4. Vitreous hemorrhage,  right eye (HCC)  H43.11     5. Epiretinal membrane (ERM) of right eye  H35.371     6. Essential hypertension  I10     7. Hypertensive retinopathy of both eyes  H35.033     8. Pseudophakia, both eyes  Z96.1     9. Ocular hypertension of right eye  H40.051       1-4. Proliferative diabetic retinopathy OU w/ vitreous hemorrhage, OD   - A1C 7.4 (09.12.24) - s/p IVA OD #1 (12.19.22), #2 (01.16.23), #3 (02.16.23), #4 (04.04.23), #5 (09.13.23), #6 (10.11.23), #7 (11.27.23), #8 (01.29.24), #9 (03.25.24), #10 (05.13.24), #11 (07.02.24), #12 (08.20.24), #13 (10.22.24), #14 (12.17.24), 315 (02.25.25) - s/p IVA OS #1 (09.13.23), #2 (10.11.23), #3 (11.27.23), #4 (01.29.24), #5 (03.25.24), #6 (05.13.24), #7 (07.02.24), #8 (08.20.24), #9 (10.22.24), #10 (12.17.24), 311 (02.25.25)  - s/p PRP OD (02.27.23), (09.19.23)  - s/p PRP OS (05.28.24)  - referred by [facility] -- formerly followed by Dr. Roby Chris - BCVA 20/25 OU -- stable - FA (09.05.23) OD: Perisistent vitreous opacities settled inferiorly inferiorly, pre-retinal fibrosis inferiorly -- stable, mild interval increase in cystic changes temporal fovea and mac OS: Mild NVD, vascular non-perfusion and perivascular leakage temporal periphery - OCT today shows  OD: Persistent vitreous opacities settled inferiorly, pre-retinal fibrosis inferiorly -- stable, persistent cystic changes temporal fovea and mac -- slightly increased; OS: Mild interval increase in IRF / IRHM temporal macula, stable improvement in vitreous opacities at 10 wks **discussed decreased efficacy / resistance to Avastin   and potential benefit of switching from Avastin  to [facility]** - recommend switching to IVE OU #1 today, 05.06.25 with follow up back to 6 weeks - pt wishes to proceed with injections - RBA of procedure discussed, questions answered - see procedure note  - IVE informed consent obtained and signed, 05.06.25 (OU) - VH precautions reviewed -- minimize activities, keep head elevated,  avoid ASA/NSAIDs/blood thinners as able  - f/u 6 weeks, DFE, OCT, possible injection(s)  5. Epiretinal membrane, right eye  - mild ERM OD - BCVA 20/25 - stable - asymptomatic, no metamorphopsia - no indication for surgery at this time - continue to monitor  6,7. Hypertensive retinopathy OU - discussed importance of tight BP control - continue to monitor  8. Pseudophakia OU  - s/p CE/IOL OU  - IOLs in good position, doing well  - continue to monitor  9. Ocular Hypertension OU  - IOP 22,23 on Brim BID OD  - continue Brimonidine  bid OD, Simbrinza  OU BID, and Timolol OU QAM   Ophthalmic Meds Ordered this visit:  Meds ordered this encounter  Medications   aflibercept (EYLEA) SOLN 2 mg   aflibercept (EYLEA) SOLN 2 mg     Return in about 6 weeks (around 09/19/2023) for f/u PDR OU, DFE, OCT, Possible Injxn.  There are no Patient Instructions on file for this visit.   Explained the diagnoses, plan, and follow up with the patient and they expressed understanding.  Patient expressed understanding of the importance of proper follow up care.   This document serves as a record of services personally performed by Jeanice Millard, MD, PhD. It was created on their behalf by Arn Lane, COT an ophthalmic technician. The creation of this record is the provider's dictation and/or activities during the visit.    Electronically signed by:  Olene Berne, COT  08/09/23 10:28 PM  This document serves as a record of services personally performed by Jeanice Millard, MD, PhD. It was created on their behalf by Morley Arabia. Bevin Bucks, OA an ophthalmic technician. The creation of this record is the provider's dictation and/or activities during the visit.    Electronically signed by: Morley Arabia. Bevin Bucks, OA 08/09/23 10:28 PM  Jeanice Millard, M.D., Ph.D. Diseases & Surgery of the Retina and Vitreous [facility]  I have reviewed the above documentation for accuracy and  completeness, and I agree with the above. Jeanice Millard, M.D., Ph.D. 08/09/23 10:30 PM   Abbreviations: M myopia (nearsighted); A astigmatism; H hyperopia (farsighted); P presbyopia; Mrx spectacle prescription;  CTL contact lenses; OD right eye; OS left eye; OU both eyes  XT exotropia; ET esotropia; PEK punctate epithelial keratitis; PEE punctate epithelial erosions; DES dry eye syndrome; MGD meibomian gland dysfunction; ATs artificial tears; PFAT's preservative free artificial tears; NSC nuclear sclerotic cataract; PSC posterior subcapsular cataract; ERM epi-retinal membrane; PVD posterior vitreous detachment; RD retinal detachment; DM diabetes mellitus; DR diabetic retinopathy; NPDR non-proliferative diabetic retinopathy; PDR proliferative diabetic retinopathy; CSME clinically significant macular edema; DME diabetic macular edema; dbh dot blot hemorrhages; CWS cotton wool spot; POAG primary open angle glaucoma; C/D cup-to-disc ratio; HVF humphrey visual field; GVF goldmann visual field; OCT optical coherence tomography; IOP intraocular pressure; BRVO Branch retinal vein occlusion; CRVO central retinal vein occlusion; CRAO central retinal artery occlusion; BRAO branch retinal artery occlusion; RT retinal tear; SB scleral buckle; PPV pars plana vitrectomy; VH Vitreous hemorrhage; PRP panretinal laser photocoagulation; IVK intravitreal kenalog ; VMT vitreomacular traction; MH Macular hole;  NVD neovascularization of the disc; NVE neovascularization elsewhere; AREDS age related eye disease study; ARMD age related macular degeneration; POAG primary open angle glaucoma; EBMD epithelial/anterior basement membrane dystrophy; ACIOL anterior chamber intraocular lens; IOL intraocular lens; PCIOL posterior chamber intraocular lens; Phaco/IOL phacoemulsification with intraocular lens placement; PRK photorefractive keratectomy; LASIK laser assisted in situ keratomileusis; HTN hypertension; DM diabetes mellitus; COPD chronic  obstructive pulmonary disease

## 2023-08-08 ENCOUNTER — Encounter (INDEPENDENT_AMBULATORY_CARE_PROVIDER_SITE_OTHER): Payer: Self-pay | Admitting: Ophthalmology

## 2023-08-08 ENCOUNTER — Ambulatory Visit (INDEPENDENT_AMBULATORY_CARE_PROVIDER_SITE_OTHER): Payer: Medicare Other | Admitting: Ophthalmology

## 2023-08-08 DIAGNOSIS — H35033 Hypertensive retinopathy, bilateral: Secondary | ICD-10-CM

## 2023-08-08 DIAGNOSIS — H4311 Vitreous hemorrhage, right eye: Secondary | ICD-10-CM

## 2023-08-08 DIAGNOSIS — Z7984 Long term (current) use of oral hypoglycemic drugs: Secondary | ICD-10-CM | POA: Diagnosis not present

## 2023-08-08 DIAGNOSIS — Z961 Presence of intraocular lens: Secondary | ICD-10-CM

## 2023-08-08 DIAGNOSIS — I10 Essential (primary) hypertension: Secondary | ICD-10-CM

## 2023-08-08 DIAGNOSIS — E113513 Type 2 diabetes mellitus with proliferative diabetic retinopathy with macular edema, bilateral: Secondary | ICD-10-CM

## 2023-08-08 DIAGNOSIS — H35371 Puckering of macula, right eye: Secondary | ICD-10-CM

## 2023-08-08 DIAGNOSIS — Z794 Long term (current) use of insulin: Secondary | ICD-10-CM | POA: Diagnosis not present

## 2023-08-08 DIAGNOSIS — H40051 Ocular hypertension, right eye: Secondary | ICD-10-CM

## 2023-08-08 MED ORDER — AFLIBERCEPT 2MG/0.05ML IZ SOLN FOR KALEIDOSCOPE
2.0000 mg | INTRAVITREAL | Status: AC | PRN
  Administered 2023-08-08: 2 mg via INTRAVITREAL

## 2023-09-12 NOTE — Progress Notes (Shared)
 [facility] - Clinic Note  09/19/2023    CHIEF COMPLAINT Patient presents for No chief complaint on file.   HISTORY OF PRESENT ILLNESS: Matthew Sellers is a 83 y.o. male who presents to the clinic today for:       Referring physician: No referring provider defined for this encounter.  HISTORICAL INFORMATION:   Selected notes from the MEDICAL RECORD NUMBER [facility]EE:  Ocular Hx- PMH   CURRENT MEDICATIONS: Current Outpatient Medications (Ophthalmic Drugs)  Medication Sig   brimonidine  (ALPHAGAN ) 0.2 % ophthalmic solution Place 1 drop into the right eye 2 (two) times daily.   Brinzolamide -Brimonidine  1-0.2 % SUSP Place 1 drop into both eyes 2 (two) times daily.   carboxymethylcellulose (REFRESH PLUS) 0.5 % SOLN INSTILL 1 DROP IN BOTH EYES FIVE TIMES A DAY   latanoprost  (XALATAN ) 0.005 % ophthalmic solution Place 1 drop into both eyes at bedtime.   Latanoprost  0.005 % EMUL    timolol (TIMOPTIC-XR) 0.5 % ophthalmic gel-forming SMARTSIG:In Eye(s)   No current facility-administered medications for this visit. (Ophthalmic Drugs)   Current Outpatient Medications (Other)  Medication Sig   amLODipine  (NORVASC ) 10 MG tablet Take 10 mg by mouth daily.   aspirin  EC 81 MG tablet Take 81 mg by mouth daily.   cefpodoxime  (VANTIN ) 200 MG tablet    fluticasone (FLONASE) 50 MCG/ACT nasal spray Place 2 sprays into both nostrils daily as needed for allergies or rhinitis.   gabapentin (NEURONTIN) 100 MG capsule Take 1 capsule by mouth 3 (three) times daily.   HYDROcodone -acetaminophen  (NORCO/VICODIN) 5-325 MG tablet Take 1-2 tablets by mouth every 4 (four) hours as needed for moderate pain.   insulin  glargine (LANTUS ) 100 UNIT/ML injection Inject 30 Units into the skin at bedtime.    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   Krill Oil 300 MG CAPS Take 300 mg by mouth daily.   lidocaine  (LIDODERM ) 5 % Place 1 patch onto the skin daily. Remove & Discard patch within 12 hours or as directed by MD   lisinopril  (ZESTRIL ) 20 MG tablet Take 2 tablets by mouth daily.   lisinopril -hydrochlorothiazide  (ZESTORETIC ) 20-25 MG tablet Take 2 tablets by mouth every morning.   metFORMIN  (GLUCOPHAGE ) 1000 MG tablet Take 1,000 mg by mouth 2 (two) times daily with a meal.   nitroGLYCERIN  (NITROSTAT ) 0.4 MG SL tablet Place 0.4 mg under the tongue every 5 (five) minutes as needed for chest pain.   pravastatin  (PRAVACHOL ) 20 MG tablet Take 20 mg by mouth at bedtime.   PRECISION XTRA TEST STRIPS test strip    predniSONE  (DELTASONE ) 10 MG tablet Take 3 tablets (30 mg total) by mouth daily.   No current facility-administered medications for this visit. (Other)   REVIEW OF SYSTEMS:     ALLERGIES No Known Allergies  PAST MEDICAL HISTORY Past Medical History:  Diagnosis Date   Anemia    Bradycardia    CAD (coronary artery disease)    Chest tightness    Chronic kidney disease    Diabetes mellitus without complication (HCC)    Diabetic retinopathy (HCC)    Dyslipidemia    Dyspnea on exertion    Glaucoma    Hypertension    Obesity    Paroxysmal atrial fibrillation (HCC)    a. identified on device interrogation   Pneumonia  Past Surgical History:  Procedure Laterality Date   ACHILLES TENDON SURGERY     for rupture   CARDIAC CATHETERIZATION  07/08/2009   mild to mod . prox LAD 50% (Dr. Mara Seminole)   CATARACT EXTRACTION     CHOLECYSTECTOMY N/A 10/11/2017   Procedure: LAPAROSCOPIC CHOLECYSTECTOMY WITH INTRAOPERATIVE CHOLANGIOGRAM;  Surgeon: Juanita Norlander, MD;  Location: [facility] OR;  Service: General;  Laterality: N/A;   DOPPLER ECHOCARDIOGRAPHY  04/10/2008   LVEF 70-80% ,norm   EYE SURGERY     PACEMAKER IMPLANT N/A 10/12/2017   Procedure: PACEMAKER IMPLANT;  Surgeon: Tammie Fall, MD;  Location: [facility]  INVASIVE CV LAB;  Service: Cardiovascular;  Laterality: N/A;   stress myoview  04/09/2008   EF 54%; LV  norm   TEMPORARY PACEMAKER N/A 10/11/2017   Procedure: TEMPORARY PACEMAKER;  Surgeon: Tammie Fall, MD;  Location: [facility] INVASIVE CV LAB;  Service: Cardiovascular;  Laterality: N/A;   FAMILY HISTORY Family History  Problem Relation Age of Onset   Leukemia Mother    SOCIAL HISTORY Social History   Tobacco Use   Smoking status: Never   Smokeless tobacco: Never  Vaping Use   Vaping status: Never Used  Substance Use Topics   Alcohol use: No    Alcohol/week: 0.0 standard drinks of alcohol   Drug use: No       OPHTHALMIC EXAM:  Not recorded    IMAGING AND PROCEDURES  Imaging and Procedures for 09/19/2023          ASSESSMENT/PLAN:  No diagnosis found.   1-4. Proliferative diabetic retinopathy OU w/ vitreous hemorrhage, OD   - A1C 7.4 (09.12.24) - s/p IVA OD #1 (12.19.22), #2 (01.16.23), #3 (02.16.23), #4 (04.04.23), #5 (09.13.23), #6 (10.11.23), #7 (11.27.23), #8 (01.29.24), #9 (03.25.24), #10 (05.13.24), #11 (07.02.24), #12 (08.20.24), #13 (10.22.24), #14 (12.17.24), 315 (02.25.25) - s/p IVA OS #1 (09.13.23), #2 (10.11.23), #3 (11.27.23), #4 (01.29.24), #5 (03.25.24), #6 (05.13.24), #7 (07.02.24), #8 (08.20.24), #9 (10.22.24), #10 (12.17.24), 311 (02.25.25) - s/p IVE OU #1 (05.06.25)  - s/p PRP OD (02.27.23), (09.19.23)  - s/p PRP OS (05.28.24)  - referred by [facility] -- formerly followed by Dr. Roby Chris - BCVA 20/25 OU -- stable - FA (09.05.23) OD: Perisistent vitreous opacities settled inferiorly inferiorly, pre-retinal fibrosis inferiorly -- stable, mild interval increase in cystic changes temporal fovea and mac OS: Mild NVD, vascular non-perfusion and perivascular leakage temporal periphery - OCT today shows  OD: Persistent vitreous opacities settled inferiorly, pre-retinal fibrosis inferiorly -- stable, persistent cystic changes temporal fovea and mac -- slightly  improved; OS: Mild interval increase in IRF / IRHM temporal macula, stable improvement in vitreous opacities at 10 wks **discussed decreased efficacy / resistance to Avastin  and potential benefit of switching from Avastin  to Eylea ** - recommend switching to IVE OU #2 today, 06.17.25 with follow up back to 6 weeks - pt wishes to proceed with injections - RBA of procedure discussed, questions answered - see procedure note  - IVE informed consent obtained and signed, 05.06.25 (OU) - VH precautions reviewed -- minimize activities, keep head elevated, avoid ASA/NSAIDs/blood thinners as able  - f/u 6 weeks, DFE, OCT, possible injection(s)  5. Epiretinal membrane, right eye  - mild ERM OD - BCVA 20/25 - stable - asymptomatic, no metamorphopsia - no indication for surgery at this time - continue to monitor  6,7. Hypertensive retinopathy OU - discussed importance of tight BP control - continue to monitor  8. Pseudophakia OU  - s/p CE/IOL OU  -  IOLs in good position, doing well  - continue to monitor  9. Ocular Hypertension OU  - IOP 22,23 on Brim BID OD  - continue Brimonidine  bid OD, Simbrinza  OU BID, and Timolol OU QAM   Ophthalmic Meds Ordered this visit:  No orders of the defined types were placed in this encounter.    No follow-ups on file.  There are no Patient Instructions on file for this visit.   Explained the diagnoses, plan, and follow up with the patient and they expressed understanding.  Patient expressed understanding of the importance of proper follow up care.   This document serves as a record of services personally performed by Jeanice Millard, MD, PhD. It was created on their behalf by Angelia Kelp, an ophthalmic technician. The creation of this record is the provider's dictation and/or activities during the visit.    Electronically signed by: Angelia Kelp, OA, 09/12/23  12:50 PM   Jeanice Millard, M.D., Ph.D. Diseases & Surgery of the Retina and  Vitreous [facility]   Abbreviations: M myopia (nearsighted); A astigmatism; H hyperopia (farsighted); P presbyopia; Mrx spectacle prescription;  CTL contact lenses; OD right eye; OS left eye; OU both eyes  XT exotropia; ET esotropia; PEK punctate epithelial keratitis; PEE punctate epithelial erosions; DES dry eye syndrome; MGD meibomian gland dysfunction; ATs artificial tears; PFAT's preservative free artificial tears; NSC nuclear sclerotic cataract; PSC posterior subcapsular cataract; ERM epi-retinal membrane; PVD posterior vitreous detachment; RD retinal detachment; DM diabetes mellitus; DR diabetic retinopathy; NPDR non-proliferative diabetic retinopathy; PDR proliferative diabetic retinopathy; CSME clinically significant macular edema; DME diabetic macular edema; dbh dot blot hemorrhages; CWS cotton wool spot; POAG primary open angle glaucoma; C/D cup-to-disc ratio; HVF humphrey visual field; GVF goldmann visual field; OCT optical coherence tomography; IOP intraocular pressure; BRVO Branch retinal vein occlusion; CRVO central retinal vein occlusion; CRAO central retinal artery occlusion; BRAO branch retinal artery occlusion; RT retinal tear; SB scleral buckle; PPV pars plana vitrectomy; VH Vitreous hemorrhage; PRP panretinal laser photocoagulation; IVK intravitreal kenalog ; VMT vitreomacular traction; MH Macular hole;  NVD neovascularization of the disc; NVE neovascularization elsewhere; AREDS age related eye disease study; ARMD age related macular degeneration; POAG primary open angle glaucoma; EBMD epithelial/anterior basement membrane dystrophy; ACIOL anterior chamber intraocular lens; IOL intraocular lens; PCIOL posterior chamber intraocular lens; Phaco/IOL phacoemulsification with intraocular lens placement; PRK photorefractive keratectomy; LASIK laser assisted in situ keratomileusis; HTN hypertension; DM diabetes mellitus; COPD chronic obstructive pulmonary disease

## 2023-09-19 ENCOUNTER — Ambulatory Visit (INDEPENDENT_AMBULATORY_CARE_PROVIDER_SITE_OTHER): Admitting: Ophthalmology

## 2023-09-19 ENCOUNTER — Encounter (INDEPENDENT_AMBULATORY_CARE_PROVIDER_SITE_OTHER): Payer: Self-pay | Admitting: Ophthalmology

## 2023-09-19 DIAGNOSIS — Z7984 Long term (current) use of oral hypoglycemic drugs: Secondary | ICD-10-CM | POA: Diagnosis not present

## 2023-09-19 DIAGNOSIS — H4311 Vitreous hemorrhage, right eye: Secondary | ICD-10-CM

## 2023-09-19 DIAGNOSIS — Z794 Long term (current) use of insulin: Secondary | ICD-10-CM

## 2023-09-19 DIAGNOSIS — H35371 Puckering of macula, right eye: Secondary | ICD-10-CM

## 2023-09-19 DIAGNOSIS — H40051 Ocular hypertension, right eye: Secondary | ICD-10-CM

## 2023-09-19 DIAGNOSIS — E113513 Type 2 diabetes mellitus with proliferative diabetic retinopathy with macular edema, bilateral: Secondary | ICD-10-CM | POA: Diagnosis not present

## 2023-09-19 DIAGNOSIS — Z961 Presence of intraocular lens: Secondary | ICD-10-CM

## 2023-09-19 DIAGNOSIS — H35033 Hypertensive retinopathy, bilateral: Secondary | ICD-10-CM

## 2023-09-19 DIAGNOSIS — I10 Essential (primary) hypertension: Secondary | ICD-10-CM

## 2023-09-19 MED ORDER — AFLIBERCEPT 2MG/0.05ML IZ SOLN FOR KALEIDOSCOPE
2.0000 mg | INTRAVITREAL | Status: AC | PRN
Start: 2023-09-19 — End: 2023-09-19
  Administered 2023-09-19: 2 mg via INTRAVITREAL

## 2023-10-26 NOTE — Progress Notes (Signed)
 [facility] - Clinic Note  11/06/2023    CHIEF COMPLAINT Patient presents for Retina Follow Up   HISTORY OF PRESENT ILLNESS: Matthew Sellers is a 83 y.o. male who presents to the clinic today for:   HPI     Retina Follow Up   Patient presents with  Diabetic Retinopathy.  In both eyes.  This started 6 weeks ago.  I, the attending physician,  performed the HPI with the patient and updated documentation appropriately.        Comments   Patient here for 6 weeks retina follow up for PDR OU. Patient states vision about the same. Has improved a little. No eye pain. Simbrinza  was up to TID OU from BID.      Last edited by Valdemar Rogue, MD on 11/06/2023 12:38 PM.     Pt states he's doing well, VA is stable and unchaged.   Referring physician: No referring provider defined for this encounter.  HISTORICAL INFORMATION:   Selected notes from the MEDICAL RECORD NUMBER Referred by [facility] for DM exam LEE:  Ocular Hx- PMH   CURRENT MEDICATIONS: Current Outpatient Medications (Ophthalmic Drugs)  Medication Sig   brimonidine  (ALPHAGAN ) 0.2 % ophthalmic solution Place 1 drop into the right eye 2 (two) times daily.   Brinzolamide -Brimonidine  1-0.2 % SUSP Place 1 drop into both eyes 2 (two) times daily.   carboxymethylcellulose (REFRESH PLUS) 0.5 % SOLN INSTILL 1 DROP IN BOTH EYES FIVE TIMES A DAY   latanoprost  (XALATAN ) 0.005 % ophthalmic solution Place 1 drop into both eyes at bedtime.   Latanoprost  0.005 % EMUL    timolol (TIMOPTIC-XR) 0.5 % ophthalmic gel-forming SMARTSIG:In Eye(s)   No current facility-administered medications for this visit. (Ophthalmic Drugs)   Current Outpatient Medications (Other)  Medication Sig   amLODipine  (NORVASC ) 10 MG tablet Take 10 mg by mouth daily.   aspirin  EC 81 MG tablet Take 81 mg by mouth daily.   cefpodoxime  (VANTIN ) 200 MG tablet    insulin  glargine (LANTUS ) 100 UNIT/ML injection Inject 30 Units into the skin at bedtime.     insulin  glargine-yfgn (SEMGLEE ) 100 UNIT/ML Pen INJECT 36 UNITS SUBCUTANEOUSLY DAILY FOR DIABETES (CONVERTED FROM LANTUS )   JARDIANCE 25 MG TABS tablet Take 1 tablet by mouth daily.   Krill Oil 300 MG CAPS Take 300 mg by mouth daily.   lidocaine  (LIDODERM ) 5 % Place 1 patch onto the skin daily. Remove & Discard patch within 12 hours or as directed by MD   lisinopril  (ZESTRIL ) 20 MG tablet Take 2 tablets by mouth daily.   lisinopril -hydrochlorothiazide  (ZESTORETIC ) 20-25 MG tablet Take 2 tablets by mouth every morning.   metFORMIN  (GLUCOPHAGE ) 1000 MG tablet Take 1,000 mg by mouth 2 (two) times daily with a meal.   pravastatin  (PRAVACHOL ) 20 MG tablet Take 20 mg by mouth at bedtime.   PRECISION XTRA TEST STRIPS test strip    fluticasone (FLONASE) 50 MCG/ACT nasal spray Place 2 sprays into both nostrils daily as needed for allergies or rhinitis.   gabapentin (NEURONTIN) 100 MG capsule Take 1 capsule by mouth 3 (three) times daily.   HYDROcodone -acetaminophen  (NORCO/VICODIN) 5-325 MG tablet Take 1-2 tablets by mouth every 4 (four) hours as needed for moderate pain.   nitroGLYCERIN  (NITROSTAT ) 0.4 MG SL tablet Place 0.4 mg under the tongue every 5 (five) minutes as needed for chest pain.   predniSONE  (DELTASONE ) 10 MG tablet Take 3 tablets (30 mg total) by mouth daily. (Patient not taking:  Reported on 11/06/2023)   No current facility-administered medications for this visit. (Other)   REVIEW OF SYSTEMS: ROS   Positive for: Genitourinary, Endocrine, Cardiovascular, Eyes Negative for: Constitutional, Gastrointestinal, Neurological, Skin, Musculoskeletal, HENT, Respiratory, Psychiatric, Allergic/Imm, Heme/Lymph Last edited by Orval Asberry RAMAN, COA on 11/06/2023  9:48 AM.      ALLERGIES No Known Allergies  PAST MEDICAL HISTORY Past Medical History:  Diagnosis Date   Anemia    Bradycardia    CAD (coronary artery disease)    Chest tightness    Chronic kidney disease    Diabetes mellitus  without complication (HCC)    Diabetic retinopathy (HCC)    Dyslipidemia    Dyspnea on exertion    Glaucoma    Hypertension    Obesity    Paroxysmal atrial fibrillation (HCC)    a. identified on device interrogation   Pneumonia    Past Surgical History:  Procedure Laterality Date   ACHILLES TENDON SURGERY     for rupture   CARDIAC CATHETERIZATION  07/08/2009   mild to mod . prox LAD 50% (Dr. Orest)   CATARACT EXTRACTION     CHOLECYSTECTOMY N/A 10/11/2017   Procedure: LAPAROSCOPIC CHOLECYSTECTOMY WITH INTRAOPERATIVE CHOLANGIOGRAM;  Surgeon: Ethyl Lenis, MD;  Location: [facility] OR;  Service: General;  Laterality: N/A;   DOPPLER ECHOCARDIOGRAPHY  04/10/2008   LVEF 70-80% ,norm   EYE SURGERY     PACEMAKER IMPLANT N/A 10/12/2017   Procedure: PACEMAKER IMPLANT;  Surgeon: Waddell Danelle ORN, MD;  Location: [facility];  Service: Cardiovascular;  Laterality: N/A;   stress myoview  04/09/2008   EF 54%; LV  norm   TEMPORARY PACEMAKER N/A 10/11/2017   Procedure: TEMPORARY PACEMAKER;  Surgeon: Waddell Danelle ORN, MD;  Location: [facility];  Service: Cardiovascular;  Laterality: N/A;   FAMILY HISTORY Family History  Problem Relation Age of Onset   Leukemia Mother    SOCIAL HISTORY Social History   Tobacco Use   Smoking status: Never   Smokeless tobacco: Never  Vaping Use   Vaping status: Never Used  Substance Use Topics   Alcohol use: No    Alcohol/week: 0.0 standard drinks of alcohol   Drug use: No       OPHTHALMIC EXAM:  Base Eye Exam     Visual Acuity (Snellen - Linear)       Right Left   Dist cc 20/25 20/25 +2    Correction: Glasses         Tonometry (Tonopen, 9:45 AM)       Right Left   Pressure 21 18         Pupils       Dark Light Shape React APD   Right 3 2 Round Brisk None   Left 3 2 Round Brisk None         Visual Fields (Counting fingers)       Left Right    Full Full         Extraocular Movement       Right Left     Full, Ortho Full, Ortho         Neuro/Psych     Oriented x3: Yes   Mood/Affect: Normal         Dilation     Both eyes: 1.0% Mydriacyl, 2.5% Phenylephrine @ 9:45 AM           Slit Lamp and Fundus Exam     Slit Lamp Exam  Right Left   Lids/Lashes Dermatochalasis - upper lid, mild MGD Dermatochalasis - upper lid, mild MGD   Conjunctiva/Sclera nasal pingeucula, Melanosis nasal pingeucula, Melanosis   Cornea 2+ Punctate epithelial erosions, well healed cataract wound, Arcus mild arcus, well healed cataract wound, 1-2+ Punctate epithelial erosions, Debris in tear film   Anterior Chamber deep and clear deep and clear   Iris round and moderately dilated, No NVI round and moderdilated to 4mm, No NVI   Lens PC IOL in good position PC IOL in good position   Anterior Vitreous Mild Vitreous syneresis, Posterior vitreous detachment, blood stained vitreous condensations -- improved Mild Vitreous syneresis, Posterior vitreous detachment, vitreous condensations, stable improvement in blood stained vitreous condensations         Fundus Exam       Right Left   Disc 2+Pallor, Sharp rim, +cupping mild Pallor, Sharp rim, +cupping, focal PPP temporal, vascular loops, early NVD -- stably improved   C/D Ratio 0.8 0.5   Macula Flat, good foveal reflex, scattered Microaneurysms, trace cystic changes -- slightly improved, RPE mottling and clumping Flat, good foveal reflex, rare MA/DBH, mild cystic changes temporal mac -- slightly improved   Vessels attenuated, mild tortuosity, mild fibrosis along IT arcades attenuated, Tortuous, no obvious NV, Copper wiring   Periphery Attached, +NV along inferior arcades and nasal midzone - improved, 360 MA/DBH, good 360 PRP changes -- with good fill in, scattered fibrosis, ?shallow schisis nasal periphery Attached, 360 MA/DBH, focal exudates nasal midzone, mild PRP laser changes 360           Refraction     Wearing Rx       Sphere Cylinder Axis Add    Right -0.75 +1.25 004 +2.75   Left -0.75 +1.25 004 +2.75    Type: PAL           IMAGING AND PROCEDURES  Imaging and Procedures for 11/06/2023  OCT, Retina - OU - Both Eyes       Right Eye Quality was good. Central Foveal Thickness: 278. Progression has improved. Findings include no IRF, no SRF, abnormal foveal contour, epiretinal membrane, preretinal fibrosis (Persistent vitreous opacities--slightly improved, pre-retinal fibrosis inferiorly -- stable, persistent cystic changes temporal fovea and mac -- slightly improved).   Left Eye Quality was borderline. Central Foveal Thickness: 264. Progression has improved. Findings include normal foveal contour, no SRF, intraretinal hyper-reflective material, intraretinal fluid (Mild interval improvement in IRF / IRHM temporal macula, stable improvement in vitreous opacities).   Notes *Images captured and stored on drive  Diagnosis / Impression:  OD: Persistent vitreous opacities--slightly improved, pre-retinal fibrosis inferiorly -- stable, persistent cystic changes temporal fovea and mac -- slightly improved OS: Mild interval improvement in IRF / IRHM temporal macula, stable improvement in vitreous opacities  Clinical management:  See below  Abbreviations: NFP - Normal foveal profile. CME - cystoid macular edema. PED - pigment epithelial detachment. IRF - intraretinal fluid. SRF - subretinal fluid. EZ - ellipsoid zone. ERM - epiretinal membrane. ORA - outer retinal atrophy. ORT - outer retinal tubulation. SRHM - subretinal hyper-reflective material. IRHM - intraretinal hyper-reflective material      Intravitreal Injection, Pharmacologic Agent - OD - Right Eye       Time Out 11/06/2023. 10:22 AM. Confirmed correct patient, procedure, site, and patient consented.   Anesthesia Topical anesthesia was used. Anesthetic medications included Lidocaine  2%, Proparacaine 0.5%.   Procedure Preparation included 5% betadine to ocular surface,  eyelid speculum. A (32g) needle was used.  Injection: 2 mg aflibercept  2 MG/0.05ML   Route: Intravitreal, Site: Right Eye   NDC: Q956576, Lot: [id number], Expiration date: 01/31/2025, Waste: 0 mL   Post-op Post injection exam found visual acuity of at least counting fingers. The patient tolerated the procedure well. There were no complications. The patient received written and verbal post procedure care education. Post injection medications were not given.      Intravitreal Injection, Pharmacologic Agent - OS - Left Eye       Time Out 11/06/2023. 10:22 AM. Confirmed correct patient, procedure, site, and patient consented.   Anesthesia Topical anesthesia was used. Anesthetic medications included Lidocaine  2%, Proparacaine 0.5%.   Procedure Preparation included 5% betadine to ocular surface, eyelid speculum. A (32g) needle was used.   Injection: 2 mg aflibercept  2 MG/0.05ML   Route: Intravitreal, Site: Left Eye   NDC: Q956576, Lot: [id number], Expiration date: 02/01/2025, Waste: 0 mL   Post-op Post injection exam found visual acuity of at least counting fingers. The patient tolerated the procedure well. There were no complications. The patient received written and verbal post procedure care education. Post injection medications were not given.             ASSESSMENT/PLAN:    ICD-10-CM   1. Proliferative diabetic retinopathy of both eyes with macular edema associated with type 2 diabetes mellitus (HCC)  E11.3513 OCT, Retina - OU - Both Eyes    Intravitreal Injection, Pharmacologic Agent - OD - Right Eye    Intravitreal Injection, Pharmacologic Agent - OS - Left Eye    aflibercept  (EYLEA ) SOLN 2 mg    aflibercept  (EYLEA ) SOLN 2 mg    2. Current use of insulin  (HCC)  Z79.4     3. Long term (current) use of oral hypoglycemic drugs  Z79.84     4. Vitreous hemorrhage, right eye (HCC)  H43.11     5. Epiretinal membrane (ERM) of right eye  H35.371     6.  Essential hypertension  I10     7. Hypertensive retinopathy of both eyes  H35.033     8. Pseudophakia, both eyes  Z96.1     9. Ocular hypertension of right eye  H40.051       1-4. Proliferative diabetic retinopathy OU w/ vitreous hemorrhage, OD   - A1C 7.4 (09.12.24) - s/p IVA OD #1 (12.19.22), #2 (01.16.23), #3 (02.16.23), #4 (04.04.23), #5 (09.13.23), #6 (10.11.23), #7 (11.27.23), #8 (01.29.24), #9 (03.25.24), #10 (05.13.24), #11 (07.02.24), #12 (08.20.24), #13 (10.22.24), #14 (12.17.24), 315 (02.25.25) - s/p IVA OS #1 (09.13.23), #2 (10.11.23), #3 (11.27.23), #4 (01.29.24), #5 (03.25.24), #6 (05.13.24), #7 (07.02.24), #8 (08.20.24), #9 (10.22.24), #10 (12.17.24), 311 (02.25.25) **IVA RESISTANCE OU** - s/p IVE OU #1 (05.06.25), #2 (06.17.25)  - s/p PRP OD (02.27.23), (09.19.23)  - s/p PRP OS (05.28.24)  - referred by [facility] -- formerly followed by Dr. Guss - BCVA 20/25 OU -- stable - FA (09.05.23) OD: Perisistent vitreous opacities settled inferiorly inferiorly, pre-retinal fibrosis inferiorly -- stable, mild interval increase in cystic changes temporal fovea and mac OS: Mild NVD, vascular non-perfusion and perivascular leakage temporal periphery - OCT today shows OD: Persistent vitreous opacities settled inferiorly, pre-retinal fibrosis inferiorly -- stable, persistent cystic changes temporal fovea and mac -- slightly improved; OS: Mild interval improvement in IRF / IRHM temporal macula, stable improvement in vitreous opacities at 6 wks - recommend IVE OU #3 today, 08.04.25 with follow up in 6 weeks - pt wishes to proceed with injections - RBA of procedure  discussed, questions answered - see procedure note  - IVE informed consent obtained and signed, 05.06.25 (OU) - VH precautions reviewed -- minimize activities, keep head elevated, avoid ASA/NSAIDs/blood thinners as able  - f/u 6 weeks, DFE, OCT, possible injection(s)  5. Epiretinal membrane, right eye  - mild ERM OD - BCVA 20/25 -  stable - asymptomatic, no metamorphopsia - no indication for surgery at this time - monitor  6,7. Hypertensive retinopathy OU - discussed importance of tight BP control - monitor  8. Pseudophakia OU  - s/p CE/IOL OU  - IOLs in good position, doing well  - monitor  9. Ocular Hypertension OU  - IOP 21,18  - continue Brimonidine  bid OD, Simbrinza  OU BID, and Timolol OU QAM   Ophthalmic Meds Ordered this visit:  Meds ordered this encounter  Medications   aflibercept  (EYLEA ) SOLN 2 mg   aflibercept  (EYLEA ) SOLN 2 mg     Return in about 6 weeks (around 12/18/2023) for PDR OU, DFE, OCT, Possible Injxn.  There are no Patient Instructions on file for this visit.   Explained the diagnoses, plan, and follow up with the patient and they expressed understanding.  Patient expressed understanding of the importance of proper follow up care.   This document serves as a record of services personally performed by Redell JUDITHANN Hans, MD, PhD. It was created on their behalf by Avelina Pereyra, COA an ophthalmic technician. The creation of this record is the provider's dictation and/or activities during the visit.   Electronically signed by: Avelina GORMAN Pereyra, COT  11/06/23  12:44 PM   This document serves as a record of services personally performed by Redell JUDITHANN Hans, MD, PhD. It was created on their behalf by Almetta Pesa, an ophthalmic technician. The creation of this record is the provider's dictation and/or activities during the visit.    Electronically signed by: Almetta Pesa, OA, 11/06/23  12:44 PM  Redell JUDITHANN Hans, M.D., Ph.D. Diseases & Surgery of the Retina and Vitreous [facility]  I have reviewed the above documentation for accuracy and completeness, and I agree with the above. Redell JUDITHANN Hans, M.D., Ph.D. 11/06/23 12:45 PM   Abbreviations: M myopia (nearsighted); A astigmatism; H hyperopia (farsighted); P presbyopia; Mrx spectacle prescription;  CTL  contact lenses; OD right eye; OS left eye; OU both eyes  XT exotropia; ET esotropia; PEK punctate epithelial keratitis; PEE punctate epithelial erosions; DES dry eye syndrome; MGD meibomian gland dysfunction; ATs artificial tears; PFAT's preservative free artificial tears; NSC nuclear sclerotic cataract; PSC posterior subcapsular cataract; ERM epi-retinal membrane; PVD posterior vitreous detachment; RD retinal detachment; DM diabetes mellitus; DR diabetic retinopathy; NPDR non-proliferative diabetic retinopathy; PDR proliferative diabetic retinopathy; CSME clinically significant macular edema; DME diabetic macular edema; dbh dot blot hemorrhages; CWS cotton wool spot; POAG primary open angle glaucoma; C/D cup-to-disc ratio; HVF humphrey visual field; GVF goldmann visual field; OCT optical coherence tomography; IOP intraocular pressure; BRVO Branch retinal vein occlusion; CRVO central retinal vein occlusion; CRAO central retinal artery occlusion; BRAO branch retinal artery occlusion; RT retinal tear; SB scleral buckle; PPV pars plana vitrectomy; VH Vitreous hemorrhage; PRP panretinal laser photocoagulation; IVK intravitreal kenalog ; VMT vitreomacular traction; MH Macular hole;  NVD neovascularization of the disc; NVE neovascularization elsewhere; AREDS age related eye disease study; ARMD age related macular degeneration; POAG primary open angle glaucoma; EBMD epithelial/anterior basement membrane dystrophy; ACIOL anterior chamber intraocular lens; IOL intraocular lens; PCIOL posterior chamber intraocular lens; Phaco/IOL phacoemulsification with intraocular lens placement;  PRK photorefractive keratectomy; LASIK laser assisted in situ keratomileusis; HTN hypertension; DM diabetes mellitus; COPD chronic obstructive pulmonary disease

## 2023-11-06 ENCOUNTER — Ambulatory Visit (INDEPENDENT_AMBULATORY_CARE_PROVIDER_SITE_OTHER): Admitting: Ophthalmology

## 2023-11-06 ENCOUNTER — Encounter (INDEPENDENT_AMBULATORY_CARE_PROVIDER_SITE_OTHER): Payer: Self-pay | Admitting: Ophthalmology

## 2023-11-06 DIAGNOSIS — Z794 Long term (current) use of insulin: Secondary | ICD-10-CM

## 2023-11-06 DIAGNOSIS — E113513 Type 2 diabetes mellitus with proliferative diabetic retinopathy with macular edema, bilateral: Secondary | ICD-10-CM | POA: Diagnosis not present

## 2023-11-06 DIAGNOSIS — H4311 Vitreous hemorrhage, right eye: Secondary | ICD-10-CM | POA: Diagnosis not present

## 2023-11-06 DIAGNOSIS — H35033 Hypertensive retinopathy, bilateral: Secondary | ICD-10-CM

## 2023-11-06 DIAGNOSIS — Z7984 Long term (current) use of oral hypoglycemic drugs: Secondary | ICD-10-CM | POA: Diagnosis not present

## 2023-11-06 DIAGNOSIS — H35371 Puckering of macula, right eye: Secondary | ICD-10-CM

## 2023-11-06 DIAGNOSIS — Z961 Presence of intraocular lens: Secondary | ICD-10-CM

## 2023-11-06 DIAGNOSIS — H40051 Ocular hypertension, right eye: Secondary | ICD-10-CM

## 2023-11-06 DIAGNOSIS — I10 Essential (primary) hypertension: Secondary | ICD-10-CM

## 2023-11-06 MED ORDER — AFLIBERCEPT 2MG/0.05ML IZ SOLN FOR KALEIDOSCOPE
2.0000 mg | INTRAVITREAL | Status: AC | PRN
Start: 2023-11-06 — End: 2023-11-06
  Administered 2023-11-06: 2 mg via INTRAVITREAL

## 2023-11-06 MED ORDER — AFLIBERCEPT 2MG/0.05ML IZ SOLN FOR KALEIDOSCOPE
2.0000 mg | INTRAVITREAL | Status: AC | PRN
  Administered 2023-11-06: 2 mg via INTRAVITREAL

## 2023-12-06 NOTE — Progress Notes (Signed)
 [facility] - Clinic Note  12/18/2023    CHIEF COMPLAINT Patient presents for Retina Follow Up   HISTORY OF PRESENT ILLNESS: Matthew Sellers is a 83 y.o. male who presents to the clinic today for:   HPI     Retina Follow Up   Patient presents with  Diabetic Retinopathy.  In both eyes.  This started 6 weeks ago.  I, the attending physician,  performed the HPI with the patient and updated documentation appropriately.        Comments   Pt denies any changes in vision/FOL/floaters/pain. Pt is consisted with Simbrinza  TID OU.       Last edited by Valdemar Rogue, MD on 12/18/2023 12:56 PM.    Pt states he's doing well, VA is stable and unchaged.   Referring physician: No referring provider defined for this encounter.  HISTORICAL INFORMATION:   Selected notes from the MEDICAL RECORD NUMBER Referred by [facility] for DM exam LEE:  Ocular Hx- PMH   CURRENT MEDICATIONS: Current Outpatient Medications (Ophthalmic Drugs)  Medication Sig   brimonidine  (ALPHAGAN ) 0.2 % ophthalmic solution Place 1 drop into the right eye 2 (two) times daily.   Brinzolamide -Brimonidine  1-0.2 % SUSP Place 1 drop into both eyes 2 (two) times daily.   carboxymethylcellulose (REFRESH PLUS) 0.5 % SOLN INSTILL 1 DROP IN BOTH EYES FIVE TIMES A DAY   latanoprost  (XALATAN ) 0.005 % ophthalmic solution Place 1 drop into both eyes at bedtime.   Latanoprost  0.005 % EMUL    timolol (TIMOPTIC-XR) 0.5 % ophthalmic gel-forming SMARTSIG:In Eye(s)   No current facility-administered medications for this visit. (Ophthalmic Drugs)   Current Outpatient Medications (Other)  Medication Sig   amLODipine  (NORVASC ) 10 MG tablet Take 10 mg by mouth daily.   aspirin  EC 81 MG tablet Take 81 mg by mouth daily.   cefpodoxime  (VANTIN ) 200 MG tablet    fluticasone (FLONASE) 50 MCG/ACT nasal spray Place 2 sprays into both nostrils daily as needed for allergies or rhinitis.   gabapentin (NEURONTIN) 100 MG capsule  Take 1 capsule by mouth 3 (three) times daily.   HYDROcodone -acetaminophen  (NORCO/VICODIN) 5-325 MG tablet Take 1-2 tablets by mouth every 4 (four) hours as needed for moderate pain.   insulin  glargine (LANTUS ) 100 UNIT/ML injection Inject 30 Units into the skin at bedtime.    insulin  glargine-yfgn (SEMGLEE ) 100 UNIT/ML Pen INJECT 36 UNITS SUBCUTANEOUSLY DAILY FOR DIABETES (CONVERTED FROM LANTUS )   JARDIANCE 25 MG TABS tablet Take 1 tablet by mouth daily.   Krill Oil 300 MG CAPS Take 300 mg by mouth daily.   lidocaine  (LIDODERM ) 5 % Place 1 patch onto the skin daily. Remove & Discard patch within 12 hours or as directed by MD   lisinopril  (ZESTRIL ) 20 MG tablet Take 2 tablets by mouth daily.   lisinopril -hydrochlorothiazide  (ZESTORETIC ) 20-25 MG tablet Take 2 tablets by mouth every morning.   metFORMIN  (GLUCOPHAGE ) 1000 MG tablet Take 1,000 mg by mouth 2 (two) times daily with a meal.   nitroGLYCERIN  (NITROSTAT ) 0.4 MG SL tablet Place 0.4 mg under the tongue every 5 (five) minutes as needed for chest pain.   pravastatin  (PRAVACHOL ) 20 MG tablet Take 20 mg by mouth at bedtime.   PRECISION XTRA TEST STRIPS test strip    predniSONE  (DELTASONE ) 10 MG tablet Take 3 tablets (30 mg total) by mouth daily. (Patient not taking: Reported on 11/06/2023)   No current facility-administered medications for this visit. (Other)   REVIEW OF SYSTEMS: ROS  Positive for: Genitourinary, Endocrine, Cardiovascular, Eyes Negative for: Constitutional, Gastrointestinal, Neurological, Skin, Musculoskeletal, HENT, Respiratory, Psychiatric, Allergic/Imm, Heme/Lymph Last edited by Elnor Avelina RAMAN, COT on 12/18/2023  9:36 AM.       ALLERGIES No Known Allergies  PAST MEDICAL HISTORY Past Medical History:  Diagnosis Date   Anemia    Bradycardia    CAD (coronary artery disease)    Chest tightness    Chronic kidney disease    Diabetes mellitus without complication (HCC)    Diabetic retinopathy (HCC)     Dyslipidemia    Dyspnea on exertion    Glaucoma    Hypertension    Obesity    Paroxysmal atrial fibrillation (HCC)    a. identified on device interrogation   Pneumonia    Past Surgical History:  Procedure Laterality Date   ACHILLES TENDON SURGERY     for rupture   CARDIAC CATHETERIZATION  07/08/2009   mild to mod . prox LAD 50% (Dr. Orest)   CATARACT EXTRACTION     CHOLECYSTECTOMY N/A 10/11/2017   Procedure: LAPAROSCOPIC CHOLECYSTECTOMY WITH INTRAOPERATIVE CHOLANGIOGRAM;  Surgeon: Ethyl Lenis, MD;  Location: [facility] OR;  Service: General;  Laterality: N/A;   DOPPLER ECHOCARDIOGRAPHY  04/10/2008   LVEF 70-80% ,norm   EYE SURGERY     PACEMAKER IMPLANT N/A 10/12/2017   Procedure: PACEMAKER IMPLANT;  Surgeon: Waddell Danelle ORN, MD;  Location: [facility] INVASIVE CV LAB;  Service: Cardiovascular;  Laterality: N/A;   stress myoview  04/09/2008   EF 54%; LV  norm   TEMPORARY PACEMAKER N/A 10/11/2017   Procedure: TEMPORARY PACEMAKER;  Surgeon: Waddell Danelle ORN, MD;  Location: [facility] INVASIVE CV LAB;  Service: Cardiovascular;  Laterality: N/A;   FAMILY HISTORY Family History  Problem Relation Age of Onset   Leukemia Mother    SOCIAL HISTORY Social History   Tobacco Use   Smoking status: Never   Smokeless tobacco: Never  Vaping Use   Vaping status: Never Used  Substance Use Topics   Alcohol use: No    Alcohol/week: 0.0 standard drinks of alcohol   Drug use: No       OPHTHALMIC EXAM:  Base Eye Exam     Visual Acuity (Snellen - Linear)       Right Left   Dist cc 20/25 -1 20/25 +2         Tonometry (Tonopen, 9:40 AM)       Right Left   Pressure 16 13         Pupils       Pupils Dark Light Shape React APD   Right PERRL 3 2 Round Brisk None   Left PERRL 3 2 Round Brisk None         Visual Fields       Left Right    Full Full         Extraocular Movement       Right Left    Full, Ortho Full, Ortho         Neuro/Psych     Oriented x3: Yes   Mood/Affect:  Normal         Dilation     Both eyes: 1.0% Mydriacyl, 2.5% Phenylephrine @ 9:40 AM           Slit Lamp and Fundus Exam     Slit Lamp Exam       Right Left   Lids/Lashes Dermatochalasis - upper lid, mild MGD Dermatochalasis - upper lid, mild MGD   Conjunctiva/Sclera nasal  pingeucula, Melanosis nasal pingeucula, Melanosis   Cornea 2+ Punctate epithelial erosions, well healed cataract wound, Arcus mild arcus, well healed cataract wound, 1-2+ Punctate epithelial erosions, Debris in tear film   Anterior Chamber deep and clear deep and clear   Iris round and moderately dilated, No NVI round and moderdilated to 4mm, No NVI   Lens PC IOL in good position PC IOL in good position   Anterior Vitreous Mild Vitreous syneresis, Posterior vitreous detachment, blood stained vitreous condensations -- improved Mild Vitreous syneresis, Posterior vitreous detachment, vitreous condensations, stable improvement in blood stained vitreous condensations         Fundus Exam       Right Left   Disc 2+Pallor, Sharp rim, +cupping mild Pallor, Sharp rim, +cupping, focal PPP temporal, vascular loops, early NVD -- stably improved   C/D Ratio 0.8 0.5   Macula Flat, good foveal reflex, scattered Microaneurysms, trace cystic changes -- slightly improved, RPE mottling and clumping Flat, good foveal reflex, rare MA/DBH, mild cystic changes temporal mac -- slightly improved   Vessels attenuated, mild tortuosity, mild fibrosis along IT arcades attenuated, Tortuous, no obvious NV, Copper wiring   Periphery Attached, +NV along inferior arcades and nasal midzone - improved, 360 MA/DBH, good 360 PRP changes -- with good fill in, scattered fibrosis, ?shallow schisis nasal periphery Attached, 360 MA/DBH, focal exudates nasal midzone, mild PRP laser changes 360           Refraction     Wearing Rx       Sphere Cylinder Axis Add   Right -0.75 +1.25 004 +2.75   Left -0.75 +1.25 004 +2.75    Type: PAL            IMAGING AND PROCEDURES  Imaging and Procedures for 12/18/2023  OCT, Retina - OU - Both Eyes       Right Eye Quality was good. Central Foveal Thickness: 273. Progression has improved. Findings include no IRF, no SRF, abnormal foveal contour, epiretinal membrane, preretinal fibrosis (Persistent vitreous opacities--stable improvement, pre-retinal fibrosis inferiorly -- stable, persistent cystic changes temporal fovea and mac -- slightly improved).   Left Eye Quality was borderline. Central Foveal Thickness: 267. Progression has improved. Findings include normal foveal contour, no SRF, intraretinal hyper-reflective material, intraretinal fluid (Mild interval improvement in IRF / IRHM temporal macula, stable improvement in vitreous opacities).   Notes *Images captured and stored on drive  Diagnosis / Impression:  OD: Persistent vitreous opacities--stably improved, pre-retinal fibrosis inferiorly -- stable, persistent cystic changes temporal fovea and mac -- slightly improved OS: Mild interval improvement in IRF / IRHM temporal macula, stable improvement in vitreous opacities  Clinical management:  See below  Abbreviations: NFP - Normal foveal profile. CME - cystoid macular edema. PED - pigment epithelial detachment. IRF - intraretinal fluid. SRF - subretinal fluid. EZ - ellipsoid zone. ERM - epiretinal membrane. ORA - outer retinal atrophy. ORT - outer retinal tubulation. SRHM - subretinal hyper-reflective material. IRHM - intraretinal hyper-reflective material      Intravitreal Injection, Pharmacologic Agent - OD - Right Eye       Time Out 12/18/2023. 10:52 AM. Confirmed correct patient, procedure, site, and patient consented.   Anesthesia Topical anesthesia was used. Anesthetic medications included Lidocaine  2%, Proparacaine 0.5%.   Procedure Preparation included 5% betadine to ocular surface, eyelid speculum. A (32g) needle was used.   Injection: 2 mg aflibercept  2  MG/0.05ML   Route: Intravitreal, Site: Right Eye   NDC: Q956576, Lot: [id number], Expiration date:  02/01/2025, Waste: 0 mL   Post-op Post injection exam found visual acuity of at least counting fingers. The patient tolerated the procedure well. There were no complications. The patient received written and verbal post procedure care education. Post injection medications were not given.      Intravitreal Injection, Pharmacologic Agent - OS - Left Eye       Time Out 12/18/2023. 10:52 AM. Confirmed correct patient, procedure, site, and patient consented.   Anesthesia Topical anesthesia was used. Anesthetic medications included Lidocaine  2%, Proparacaine 0.5%.   Procedure Preparation included 5% betadine to ocular surface, eyelid speculum. A (32g) needle was used.   Injection: 2 mg aflibercept  2 MG/0.05ML   Route: Intravitreal, Site: Left Eye   NDC: D2246706, Lot: [id number], Expiration date: 02/01/2025, Waste: 0 mL   Post-op Post injection exam found visual acuity of at least counting fingers. The patient tolerated the procedure well. There were no complications. The patient received written and verbal post procedure care education. Post injection medications were not given.              ASSESSMENT/PLAN:    ICD-10-CM   1. Proliferative diabetic retinopathy of both eyes with macular edema associated with type 2 diabetes mellitus (HCC)  E11.3513 OCT, Retina - OU - Both Eyes    Intravitreal Injection, Pharmacologic Agent - OD - Right Eye    Intravitreal Injection, Pharmacologic Agent - OS - Left Eye    aflibercept  (EYLEA ) SOLN 2 mg    aflibercept  (EYLEA ) SOLN 2 mg    2. Current use of insulin  (HCC)  Z79.4     3. Long term (current) use of oral hypoglycemic drugs  Z79.84     4. Vitreous hemorrhage, right eye (HCC)  H43.11     5. Epiretinal membrane (ERM) of right eye  H35.371     6. Essential hypertension  I10     7. Hypertensive retinopathy of both eyes   H35.033     8. Pseudophakia, both eyes  Z96.1     9. Ocular hypertension of right eye  H40.051      1-4. Proliferative diabetic retinopathy OU w/ vitreous hemorrhage, OD   - A1C 7.4 (09.12.24) - s/p IVA OD #1 (12.19.22), #2 (01.16.23), #3 (02.16.23), #4 (04.04.23), #5 (09.13.23), #6 (10.11.23), #7 (11.27.23), #8 (01.29.24), #9 (03.25.24), #10 (05.13.24), #11 (07.02.24), #12 (08.20.24), #13 (10.22.24), #14 (12.17.24), #15 (02.25.25) - s/p IVA OS #1 (09.13.23), #2 (10.11.23), #3 (11.27.23), #4 (01.29.24), #5 (03.25.24), #6 (05.13.24), #7 (07.02.24), #8 (08.20.24), #9 (10.22.24), #10 (12.17.24), #11 (02.25.25) **IVA RESISTANCE OU** ==================== - s/p IVE OU #1 (05.06.25), #2 (06.17.25), #3 (08.04.25)   - s/p PRP OD (02.27.23), (09.19.23)  - s/p PRP OS (05.28.24)  - referred by [facility] -- formerly followed by Dr. Guss - BCVA 20/25 OU -- stable - FA (09.05.23) OD: Perisistent vitreous opacities settled inferiorly inferiorly, pre-retinal fibrosis inferiorly -- stable, mild interval increase in cystic changes temporal fovea and mac OS: Mild NVD, vascular non-perfusion and perivascular leakage temporal periphery - OCT today shows OD: Persistent vitreous opacities settled inferiorly, pre-retinal fibrosis inferiorly -- stable, persistent cystic changes temporal fovea and mac -- slightly improved; OS: Mild interval improvement in IRF / IRHM temporal macula, stable improvement in vitreous opacities at 6 wks - recommend IVE today OU #4 (09.15.25) with follow up in 6-7 weeks - pt wishes to proceed with injections - RBA of procedure discussed, questions answered - see procedure note  - IVE informed consent obtained and signed, 05.06.25 (OU) -  VH precautions reviewed -- minimize activities, keep head elevated, avoid ASA/NSAIDs/blood thinners as able  - f/u 6-7 weeks, DFE, OCT, possible injection(s)  5. Epiretinal membrane, right eye  - mild ERM OD - BCVA 20/25 - stable - asymptomatic, no  metamorphopsia - no indication for surgery at this time - monitor  6,7. Hypertensive retinopathy OU - discussed importance of tight BP control - monitor  8. Pseudophakia OU  - s/p CE/IOL OU  - IOLs in good position, doing well  - monitor  9. Ocular Hypertension OU  - IOP 16, 13 - continue Brimonidine  bid OD, Simbrinza  OU BID, and Timolol OU Qam  Ophthalmic Meds Ordered this visit:  Meds ordered this encounter  Medications   aflibercept  (EYLEA ) SOLN 2 mg   aflibercept  (EYLEA ) SOLN 2 mg     Return in about 7 weeks (around 02/05/2024) for f/u , PDR, DFE, OCT, Possible, IVE, OU.  There are no Patient Instructions on file for this visit.   Explained the diagnoses, plan, and follow up with the patient and they expressed understanding.  Patient expressed understanding of the importance of proper follow up care.   This document serves as a record of services personally performed by Redell JUDITHANN Hans, MD, PhD. It was created on their behalf by Avelina Pereyra, COA an ophthalmic technician. The creation of this record is the provider's dictation and/or activities during the visit.   Electronically signed by: Avelina GORMAN Pereyra, COT  12/24/23  11:26 PM   This document serves as a record of services personally performed by Redell JUDITHANN Hans, MD, PhD. It was created on their behalf by Wanda GEANNIE Keens, COT an ophthalmic technician. The creation of this record is the provider's dictation and/or activities during the visit.    Electronically signed by:  Wanda GEANNIE Keens, COT  12/24/23 11:26 PM  Redell JUDITHANN Hans, M.D., Ph.D. Diseases & Surgery of the Retina and Vitreous [facility]  I have reviewed the above documentation for accuracy and completeness, and I agree with the above. Redell JUDITHANN Hans, M.D., Ph.D. 12/24/23 11:28 PM   Abbreviations: M myopia (nearsighted); A astigmatism; H hyperopia (farsighted); P presbyopia; Mrx spectacle prescription;  CTL contact lenses;  OD right eye; OS left eye; OU both eyes  XT exotropia; ET esotropia; PEK punctate epithelial keratitis; PEE punctate epithelial erosions; DES dry eye syndrome; MGD meibomian gland dysfunction; ATs artificial tears; PFAT's preservative free artificial tears; NSC nuclear sclerotic cataract; PSC posterior subcapsular cataract; ERM epi-retinal membrane; PVD posterior vitreous detachment; RD retinal detachment; DM diabetes mellitus; DR diabetic retinopathy; NPDR non-proliferative diabetic retinopathy; PDR proliferative diabetic retinopathy; CSME clinically significant macular edema; DME diabetic macular edema; dbh dot blot hemorrhages; CWS cotton wool spot; POAG primary open angle glaucoma; C/D cup-to-disc ratio; HVF humphrey visual field; GVF goldmann visual field; OCT optical coherence tomography; IOP intraocular pressure; BRVO Branch retinal vein occlusion; CRVO central retinal vein occlusion; CRAO central retinal artery occlusion; BRAO branch retinal artery occlusion; RT retinal tear; SB scleral buckle; PPV pars plana vitrectomy; VH Vitreous hemorrhage; PRP panretinal laser photocoagulation; IVK intravitreal kenalog ; VMT vitreomacular traction; MH Macular hole;  NVD neovascularization of the disc; NVE neovascularization elsewhere; AREDS age related eye disease study; ARMD age related macular degeneration; POAG primary open angle glaucoma; EBMD epithelial/anterior basement membrane dystrophy; ACIOL anterior chamber intraocular lens; IOL intraocular lens; PCIOL posterior chamber intraocular lens; Phaco/IOL phacoemulsification with intraocular lens placement; PRK photorefractive keratectomy; LASIK laser assisted in situ keratomileusis; HTN hypertension; DM diabetes  mellitus; COPD chronic obstructive pulmonary disease

## 2023-12-18 ENCOUNTER — Encounter (INDEPENDENT_AMBULATORY_CARE_PROVIDER_SITE_OTHER): Payer: Self-pay | Admitting: Ophthalmology

## 2023-12-18 ENCOUNTER — Ambulatory Visit (INDEPENDENT_AMBULATORY_CARE_PROVIDER_SITE_OTHER): Admitting: Ophthalmology

## 2023-12-18 DIAGNOSIS — Z7984 Long term (current) use of oral hypoglycemic drugs: Secondary | ICD-10-CM | POA: Diagnosis not present

## 2023-12-18 DIAGNOSIS — I10 Essential (primary) hypertension: Secondary | ICD-10-CM

## 2023-12-18 DIAGNOSIS — H4311 Vitreous hemorrhage, right eye: Secondary | ICD-10-CM | POA: Diagnosis not present

## 2023-12-18 DIAGNOSIS — E113513 Type 2 diabetes mellitus with proliferative diabetic retinopathy with macular edema, bilateral: Secondary | ICD-10-CM | POA: Diagnosis not present

## 2023-12-18 DIAGNOSIS — H35371 Puckering of macula, right eye: Secondary | ICD-10-CM

## 2023-12-18 DIAGNOSIS — Z794 Long term (current) use of insulin: Secondary | ICD-10-CM | POA: Diagnosis not present

## 2023-12-18 DIAGNOSIS — H40051 Ocular hypertension, right eye: Secondary | ICD-10-CM

## 2023-12-18 DIAGNOSIS — Z961 Presence of intraocular lens: Secondary | ICD-10-CM

## 2023-12-18 DIAGNOSIS — H35033 Hypertensive retinopathy, bilateral: Secondary | ICD-10-CM

## 2023-12-18 MED ORDER — AFLIBERCEPT 2MG/0.05ML IZ SOLN FOR KALEIDOSCOPE
2.0000 mg | INTRAVITREAL | Status: AC | PRN
  Administered 2023-12-18: 2 mg via INTRAVITREAL

## 2024-01-23 NOTE — Progress Notes (Signed)
 [facility] - Clinic Note  02/06/2024    CHIEF COMPLAINT Patient presents for Retina Follow Up   HISTORY OF PRESENT ILLNESS: Matthew Sellers is a 83 y.o. male who presents to the clinic today for:   HPI     Retina Follow Up   Patient presents with  Diabetic Retinopathy.  In both eyes.  This started 7 weeks ago.  I, the attending physician,  performed the HPI with the patient and updated documentation appropriately.        Comments   Patient here for 7 weeks retina follow up for PDR OU. Patient  states vision about the same. No eye pain. Using drops.      Last edited by Valdemar Rogue, MD on 02/06/2024  7:37 PM.     Pt states   Referring physician: No referring provider defined for this encounter.  HISTORICAL INFORMATION:   Selected notes from the MEDICAL RECORD NUMBER [facility]EE:  Ocular Hx- PMH   CURRENT MEDICATIONS: Current Outpatient Medications (Ophthalmic Drugs)  Medication Sig   brimonidine  (ALPHAGAN ) 0.2 % ophthalmic solution Place 1 drop into the right eye 2 (two) times daily.   Brinzolamide -Brimonidine  1-0.2 % SUSP Place 1 drop into both eyes 2 (two) times daily.   carboxymethylcellulose (REFRESH PLUS) 0.5 % SOLN INSTILL 1 DROP IN BOTH EYES FIVE TIMES A DAY   latanoprost  (XALATAN ) 0.005 % ophthalmic solution Place 1 drop into both eyes at bedtime.   Latanoprost  0.005 % EMUL    timolol (TIMOPTIC-XR) 0.5 % ophthalmic gel-forming SMARTSIG:In Eye(s)   No current facility-administered medications for this visit. (Ophthalmic Drugs)   Current Outpatient Medications (Other)  Medication Sig   amLODipine  (NORVASC ) 10 MG tablet Take 10 mg by mouth daily.   aspirin  EC 81 MG tablet Take 81 mg by mouth daily.   cefpodoxime  (VANTIN ) 200 MG tablet    insulin  glargine (LANTUS ) 100 UNIT/ML injection Inject 30 Units into the skin at bedtime.    insulin  glargine-yfgn (SEMGLEE ) 100 UNIT/ML Pen INJECT 36 UNITS SUBCUTANEOUSLY DAILY  FOR DIABETES (CONVERTED FROM LANTUS )   JARDIANCE 25 MG TABS tablet Take 1 tablet by mouth daily.   Krill Oil 300 MG CAPS Take 300 mg by mouth daily.   lidocaine  (LIDODERM ) 5 % Place 1 patch onto the skin daily. Remove & Discard patch within 12 hours or as directed by MD   lisinopril  (ZESTRIL ) 20 MG tablet Take 2 tablets by mouth daily.   lisinopril -hydrochlorothiazide  (ZESTORETIC ) 20-25 MG tablet Take 2 tablets by mouth every morning.   metFORMIN  (GLUCOPHAGE ) 1000 MG tablet Take 1,000 mg by mouth 2 (two) times daily with a meal.   pravastatin  (PRAVACHOL ) 20 MG tablet Take 20 mg by mouth at bedtime.   PRECISION XTRA TEST STRIPS test strip    fluticasone (FLONASE) 50 MCG/ACT nasal spray Place 2 sprays into both nostrils daily as needed for allergies or rhinitis.   gabapentin (NEURONTIN) 100 MG capsule Take 1 capsule by mouth 3 (three) times daily.   HYDROcodone -acetaminophen  (NORCO/VICODIN) 5-325 MG tablet Take 1-2 tablets by mouth every 4 (four) hours as needed for moderate pain.   nitroGLYCERIN  (NITROSTAT ) 0.4 MG SL tablet Place 0.4 mg under the tongue every 5 (five) minutes as needed for chest pain.   predniSONE  (DELTASONE ) 10 MG tablet Take 3 tablets (30 mg total) by mouth daily. (Patient not taking: Reported on 02/06/2024)   No current facility-administered medications for this visit. (Other)   REVIEW  OF SYSTEMS: ROS   Positive for: Genitourinary, Endocrine, Cardiovascular, Eyes Negative for: Constitutional, Gastrointestinal, Neurological, Skin, Musculoskeletal, HENT, Respiratory, Psychiatric, Allergic/Imm, Heme/Lymph Last edited by Orval Asberry RAMAN, COA on 02/06/2024  9:36 AM.        ALLERGIES No Known Allergies  PAST MEDICAL HISTORY Past Medical History:  Diagnosis Date   Anemia    Bradycardia    CAD (coronary artery disease)    Chest tightness    Chronic kidney disease    Diabetes mellitus without complication (HCC)    Diabetic retinopathy (HCC)    Dyslipidemia     Dyspnea on exertion    Glaucoma    Hypertension    Obesity    Paroxysmal atrial fibrillation (HCC)    a. identified on device interrogation   Pneumonia    Past Surgical History:  Procedure Laterality Date   ACHILLES TENDON SURGERY     for rupture   CARDIAC CATHETERIZATION  07/08/2009   mild to mod . prox LAD 50% (Dr. Orest)   CATARACT EXTRACTION     CHOLECYSTECTOMY N/A 10/11/2017   Procedure: LAPAROSCOPIC CHOLECYSTECTOMY WITH INTRAOPERATIVE CHOLANGIOGRAM;  Surgeon: Ethyl Lenis, MD;  Location: [facility] OR;  Service: General;  Laterality: N/A;   DOPPLER ECHOCARDIOGRAPHY  04/10/2008   LVEF 70-80% ,norm   EYE SURGERY     PACEMAKER IMPLANT N/A 10/12/2017   Procedure: PACEMAKER IMPLANT;  Surgeon: Waddell Danelle ORN, MD;  Location: [facility] INVASIVE CV LAB;  Service: Cardiovascular;  Laterality: N/A;   stress myoview  04/09/2008   EF 54%; LV  norm   TEMPORARY PACEMAKER N/A 10/11/2017   Procedure: TEMPORARY PACEMAKER;  Surgeon: Waddell Danelle ORN, MD;  Location: [facility] INVASIVE CV LAB;  Service: Cardiovascular;  Laterality: N/A;   FAMILY HISTORY Family History  Problem Relation Age of Onset   Leukemia Mother    SOCIAL HISTORY Social History   Tobacco Use   Smoking status: Never   Smokeless tobacco: Never  Vaping Use   Vaping status: Never Used  Substance Use Topics   Alcohol use: No    Alcohol/week: 0.0 standard drinks of alcohol   Drug use: No       OPHTHALMIC EXAM:  Base Eye Exam     Visual Acuity (Snellen - Linear)       Right Left   Dist cc 20/25 20/25    Correction: Glasses         Tonometry (Tonopen, 9:35 AM)       Right Left   Pressure 17 15         Pupils       Dark Light Shape React APD   Right 3 2 Round Brisk None   Left 3 2 Round Brisk None         Visual Fields (Counting fingers)       Left Right    Full Full         Extraocular Movement       Right Left    Full, Ortho Full, Ortho         Neuro/Psych     Oriented x3: Yes    Mood/Affect: Normal         Dilation     Both eyes: 1.0% Mydriacyl, 2.5% Phenylephrine @ 9:34 AM           Slit Lamp and Fundus Exam     Slit Lamp Exam       Right Left   Lids/Lashes Dermatochalasis - upper lid, mild MGD Dermatochalasis -  upper lid, mild MGD   Conjunctiva/Sclera nasal pingeucula, Melanosis nasal pingeucula, Melanosis   Cornea 2+ Punctate epithelial erosions, well healed cataract wound, Arcus mild arcus, well healed cataract wound, 1-2+ Punctate epithelial erosions, Debris in tear film   Anterior Chamber deep and clear deep and clear   Iris round and moderately dilated, No NVI round and moderdilated to 4mm, No NVI   Lens PC IOL in good position PC IOL in good position   Anterior Vitreous Mild Vitreous syneresis, Posterior vitreous detachment, blood stained vitreous condensations -- improved Mild Vitreous syneresis, Posterior vitreous detachment, vitreous condensations, stable improvement in blood stained vitreous condensations         Fundus Exam       Right Left   Disc 2+Pallor, Sharp rim, +cupping mild Pallor, Sharp rim, +cupping, focal PPP temporal, vascular loops, early NVD -- stably improved   C/D Ratio 0.8 0.5   Macula Flat, good foveal reflex, scattered Microaneurysms, trace cystic changes -- slightly improved, RPE mottling and clumping Flat, good foveal reflex, rare MA/DBH, mild cystic changes temporal mac -- slightly improved   Vessels attenuated, mild tortuosity, mild fibrosis along IT arcades attenuated, Tortuous, no obvious NV, Copper wiring   Periphery Attached, +NV along inferior arcades and nasal midzone - improved, 360 MA/DBH, good 360 PRP changes -- with good fill in, scattered fibrosis, ?shallow schisis nasal periphery Attached, 360 MA/DBH, focal exudates nasal midzone, mild PRP laser changes 360           Refraction     Wearing Rx       Sphere Cylinder Axis Add   Right -0.75 +1.25 004 +2.75   Left -0.75 +1.25 004 +2.75    Type:  PAL           IMAGING AND PROCEDURES  Imaging and Procedures for 02/06/2024  OCT, Retina - OU - Both Eyes       Right Eye Quality was good. Central Foveal Thickness: 273. Progression has improved. Findings include no IRF, no SRF, abnormal foveal contour, epiretinal membrane, preretinal fibrosis (Stable improvement in vitreous opacities, pre-retinal fibrosis inferiorly -- stable, persistent cystic changes temporal fovea and mac -- slightly improved).   Left Eye Quality was borderline. Central Foveal Thickness: 263. Progression has improved. Findings include normal foveal contour, no SRF, intraretinal hyper-reflective material, intraretinal fluid (Mild interval improvement in IRF / IRHM temporal macula, stable improvement in vitreous opacities).   Notes *Images captured and stored on drive  Diagnosis / Impression:  OD: Stable improvement in vitreous opacities, pre-retinal fibrosis inferiorly -- stable, persistent cystic changes temporal fovea and mac -- slightly improved OS: Mild interval improvement in IRF / IRHM temporal macula, stable improvement in vitreous opacities  Clinical management:  See below  Abbreviations: NFP - Normal foveal profile. CME - cystoid macular edema. PED - pigment epithelial detachment. IRF - intraretinal fluid. SRF - subretinal fluid. EZ - ellipsoid zone. ERM - epiretinal membrane. ORA - outer retinal atrophy. ORT - outer retinal tubulation. SRHM - subretinal hyper-reflective material. IRHM - intraretinal hyper-reflective material      Intravitreal Injection, Pharmacologic Agent - OD - Right Eye       Time Out 02/06/2024. 9:30 AM. Confirmed correct patient, procedure, site, and patient consented.   Anesthesia Topical anesthesia was used. Anesthetic medications included Lidocaine  2%, Proparacaine 0.5%.   Procedure Preparation included 5% betadine to ocular surface, eyelid speculum. A (32g) needle was used.   Injection: 2 mg aflibercept  2  MG/0.05ML   Route: Intravitreal, Site:  Right Eye   NDC: Q956576, Lot: [id number], Expiration date: 03/03/2025, Waste: 0 mL   Post-op Post injection exam found visual acuity of at least counting fingers. The patient tolerated the procedure well. There were no complications. The patient received written and verbal post procedure care education. Post injection medications were not given.      Intravitreal Injection, Pharmacologic Agent - OS - Left Eye       Time Out 02/06/2024. 9:31 AM. Confirmed correct patient, procedure, site, and patient consented.   Anesthesia Topical anesthesia was used. Anesthetic medications included Lidocaine  2%, Proparacaine 0.5%.   Procedure Preparation included 5% betadine to ocular surface, eyelid speculum. A (32g) needle was used.   Injection: 2 mg aflibercept  2 MG/0.05ML   Route: Intravitreal, Site: Left Eye   NDC: Q956576, Lot: [id number], Expiration date: 02/01/2025, Waste: 0 mL   Post-op Post injection exam found visual acuity of at least counting fingers. The patient tolerated the procedure well. There were no complications. The patient received written and verbal post procedure care education. Post injection medications were not given.            ASSESSMENT/PLAN:    ICD-10-CM   1. Proliferative diabetic retinopathy of both eyes with macular edema associated with type 2 diabetes mellitus (HCC)  E11.3513 OCT, Retina - OU - Both Eyes    Intravitreal Injection, Pharmacologic Agent - OD - Right Eye    Intravitreal Injection, Pharmacologic Agent - OS - Left Eye    aflibercept  (EYLEA ) SOLN 2 mg    aflibercept  (EYLEA ) SOLN 2 mg    2. Current use of insulin  (HCC)  Z79.4     3. Long term (current) use of oral hypoglycemic drugs  Z79.84     4. Vitreous hemorrhage, right eye (HCC)  H43.11     5. Epiretinal membrane (ERM) of right eye  H35.371     6. Essential hypertension  I10     7. Hypertensive retinopathy of both eyes  H35.033      8. Pseudophakia, both eyes  Z96.1     9. Ocular hypertension of right eye  H40.051       1-4. Proliferative diabetic retinopathy OU w/ vitreous hemorrhage, OD   - A1C 7.4 (09.12.24) - s/p IVA OD #1 (12.19.22), #2 (01.16.23), #3 (02.16.23), #4 (04.04.23), #5 (09.13.23), #6 (10.11.23), #7 (11.27.23), #8 (01.29.24), #9 (03.25.24), #10 (05.13.24), #11 (07.02.24), #12 (08.20.24), #13 (10.22.24), #14 (12.17.24), #15 (02.25.25) - s/p IVA OS #1 (09.13.23), #2 (10.11.23), #3 (11.27.23), #4 (01.29.24), #5 (03.25.24), #6 (05.13.24), #7 (07.02.24), #8 (08.20.24), #9 (10.22.24), #10 (12.17.24), #11 (02.25.25) **IVA RESISTANCE OU** ==================== - s/p IVE OU #1 (05.06.25), #2 (06.17.25), #3 (08.04.25), #4 (09.15.25)  - s/p PRP OD (02.27.23), (09.19.23)  - s/p PRP OS (05.28.24)  - referred by [facility] -- formerly followed by Dr. Guss - BCVA 20/25 OU -- stable - FA (09.05.23) OD: Perisistent vitreous opacities settled inferiorly inferiorly, pre-retinal fibrosis inferiorly -- stable, mild interval increase in cystic changes temporal fovea and mac OS: Mild NVD, vascular non-perfusion and perivascular leakage temporal periphery - OCT today shows NI:Dujaoz improvement in vitreous opacities, pre-retinal fibrosis inferiorly -- stable, persistent cystic changes temporal fovea and mac -- slightly improved; OS: Mild interval improvement in IRF / IRHM temporal macula, stable improvement in vitreous opacities at 7 wks - recommend IVE today OU #5 (11.04.25) with follow up in 6 weeks (due to holiday) - pt wishes to proceed with injections - RBA of procedure discussed, questions answered - see  procedure note  - IVE informed consent obtained and signed, 05.06.25 (OU) - VH precautions reviewed -- minimize activities, keep head elevated, avoid ASA/NSAIDs/blood thinners as able  - f/u 6 weeks, DFE, OCT, possible injection(s)  5. Epiretinal membrane, right eye  - mild ERM OD - BCVA 20/25 - stable - asymptomatic, no  metamorphopsia - no indication for surgery at this time - monitor  6,7. Hypertensive retinopathy OU - discussed importance of tight BP control - monitor  8. Pseudophakia OU  - s/p CE/IOL OU  - IOLs in good position, doing well  - monitor  9. Ocular Hypertension OU  - IOP 17,15 - continue Brimonidine  bid OD, Simbrinza  OU BID, and Timolol OU Qam  Ophthalmic Meds Ordered this visit:  Meds ordered this encounter  Medications   aflibercept  (EYLEA ) SOLN 2 mg   aflibercept  (EYLEA ) SOLN 2 mg     Return in about 6 weeks (around 03/19/2024) for PDR OU, DFE, OCT.  There are no Patient Instructions on file for this visit.   Explained the diagnoses, plan, and follow up with the patient and they expressed understanding.  Patient expressed understanding of the importance of proper follow up care.    This document serves as a record of services personally performed by Redell JUDITHANN Hans, MD, PhD. It was created on their behalf by Wanda GEANNIE Keens, COT an ophthalmic technician. The creation of this record is the provider's dictation and/or activities during the visit.    Electronically signed by:  Wanda GEANNIE Keens, COT  02/06/24 7:38 PM  This document serves as a record of services personally performed by Redell JUDITHANN Hans, MD, PhD. It was created on their behalf by Almetta Pesa, an ophthalmic technician. The creation of this record is the provider's dictation and/or activities during the visit.    Electronically signed by: Almetta Pesa, OA, 02/06/24  7:38 PM  Redell JUDITHANN Hans, M.D., Ph.D. Diseases & Surgery of the Retina and Vitreous [facility]  I have reviewed the above documentation for accuracy and completeness, and I agree with the above. Redell JUDITHANN Hans, M.D., Ph.D. 02/06/24 7:39 PM   Abbreviations: M myopia (nearsighted); A astigmatism; H hyperopia (farsighted); P presbyopia; Mrx spectacle prescription;  CTL contact lenses; OD right eye; OS left  eye; OU both eyes  XT exotropia; ET esotropia; PEK punctate epithelial keratitis; PEE punctate epithelial erosions; DES dry eye syndrome; MGD meibomian gland dysfunction; ATs artificial tears; PFAT's preservative free artificial tears; NSC nuclear sclerotic cataract; PSC posterior subcapsular cataract; ERM epi-retinal membrane; PVD posterior vitreous detachment; RD retinal detachment; DM diabetes mellitus; DR diabetic retinopathy; NPDR non-proliferative diabetic retinopathy; PDR proliferative diabetic retinopathy; CSME clinically significant macular edema; DME diabetic macular edema; dbh dot blot hemorrhages; CWS cotton wool spot; POAG primary open angle glaucoma; C/D cup-to-disc ratio; HVF humphrey visual field; GVF goldmann visual field; OCT optical coherence tomography; IOP intraocular pressure; BRVO Branch retinal vein occlusion; CRVO central retinal vein occlusion; CRAO central retinal artery occlusion; BRAO branch retinal artery occlusion; RT retinal tear; SB scleral buckle; PPV pars plana vitrectomy; VH Vitreous hemorrhage; PRP panretinal laser photocoagulation; IVK intravitreal kenalog ; VMT vitreomacular traction; MH Macular hole;  NVD neovascularization of the disc; NVE neovascularization elsewhere; AREDS age related eye disease study; ARMD age related macular degeneration; POAG primary open angle glaucoma; EBMD epithelial/anterior basement membrane dystrophy; ACIOL anterior chamber intraocular lens; IOL intraocular lens; PCIOL posterior chamber intraocular lens; Phaco/IOL phacoemulsification with intraocular lens placement; PRK photorefractive keratectomy; LASIK laser assisted in  situ keratomileusis; HTN hypertension; DM diabetes mellitus; COPD chronic obstructive pulmonary disease

## 2024-02-06 ENCOUNTER — Encounter (INDEPENDENT_AMBULATORY_CARE_PROVIDER_SITE_OTHER): Payer: Self-pay | Admitting: Ophthalmology

## 2024-02-06 ENCOUNTER — Ambulatory Visit (INDEPENDENT_AMBULATORY_CARE_PROVIDER_SITE_OTHER): Admitting: Ophthalmology

## 2024-02-06 DIAGNOSIS — H35033 Hypertensive retinopathy, bilateral: Secondary | ICD-10-CM

## 2024-02-06 DIAGNOSIS — H35371 Puckering of macula, right eye: Secondary | ICD-10-CM

## 2024-02-06 DIAGNOSIS — H4311 Vitreous hemorrhage, right eye: Secondary | ICD-10-CM | POA: Diagnosis not present

## 2024-02-06 DIAGNOSIS — E113513 Type 2 diabetes mellitus with proliferative diabetic retinopathy with macular edema, bilateral: Secondary | ICD-10-CM | POA: Diagnosis not present

## 2024-02-06 DIAGNOSIS — Z7984 Long term (current) use of oral hypoglycemic drugs: Secondary | ICD-10-CM

## 2024-02-06 DIAGNOSIS — Z961 Presence of intraocular lens: Secondary | ICD-10-CM

## 2024-02-06 DIAGNOSIS — H40051 Ocular hypertension, right eye: Secondary | ICD-10-CM

## 2024-02-06 DIAGNOSIS — Z794 Long term (current) use of insulin: Secondary | ICD-10-CM | POA: Diagnosis not present

## 2024-02-06 DIAGNOSIS — I10 Essential (primary) hypertension: Secondary | ICD-10-CM

## 2024-02-06 MED ORDER — AFLIBERCEPT 2MG/0.05ML IZ SOLN FOR KALEIDOSCOPE
2.0000 mg | INTRAVITREAL | Status: AC | PRN
  Administered 2024-02-06: 2 mg via INTRAVITREAL

## 2024-03-14 NOTE — Progress Notes (Deleted)
 [facility] - Clinic Note  03/20/2024    CHIEF COMPLAINT Patient presents for No chief complaint on file.   HISTORY OF PRESENT ILLNESS: Matthew Sellers is a 83 y.o. male who presents to the clinic today for:     Pt states   Referring physician: No referring provider defined for this encounter.  HISTORICAL INFORMATION:   Selected notes from the MEDICAL RECORD NUMBER [facility] LEE:  Ocular Hx- PMH   CURRENT MEDICATIONS: Current Outpatient Medications (Ophthalmic Drugs)  Medication Sig   brimonidine  (ALPHAGAN ) 0.2 % ophthalmic solution Place 1 drop into the right eye 2 (two) times daily.   Brinzolamide -Brimonidine  1-0.2 % SUSP Place 1 drop into both eyes 2 (two) times daily.   carboxymethylcellulose (REFRESH PLUS) 0.5 % SOLN INSTILL 1 DROP IN BOTH EYES FIVE TIMES A DAY   latanoprost  (XALATAN ) 0.005 % ophthalmic solution Place 1 drop into both eyes at bedtime.   Latanoprost  0.005 % EMUL    timolol (TIMOPTIC-XR) 0.5 % ophthalmic gel-forming SMARTSIG:In Eye(s)   No current facility-administered medications for this visit. (Ophthalmic Drugs)   Current Outpatient Medications (Other)  Medication Sig   amLODipine  (NORVASC ) 10 MG tablet Take 10 mg by mouth daily.   aspirin  EC 81 MG tablet Take 81 mg by mouth daily.   cefpodoxime  (VANTIN ) 200 MG tablet    fluticasone (FLONASE) 50 MCG/ACT nasal spray Place 2 sprays into both nostrils daily as needed for allergies or rhinitis.   gabapentin (NEURONTIN) 100 MG capsule Take 1 capsule by mouth 3 (three) times daily.   HYDROcodone -acetaminophen  (NORCO/VICODIN) 5-325 MG tablet Take 1-2 tablets by mouth every 4 (four) hours as needed for moderate pain.   insulin  glargine (LANTUS ) 100 UNIT/ML injection Inject 30 Units into the skin at bedtime.    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   Krill Oil 300 MG CAPS Take 300 mg by mouth daily.   lidocaine  (LIDODERM ) 5 % Place 1 patch onto the skin daily. Remove & Discard patch within 12 hours or as directed by MD   lisinopril  (ZESTRIL ) 20 MG tablet Take 2 tablets by mouth daily.   lisinopril -hydrochlorothiazide  (ZESTORETIC ) 20-25 MG tablet Take 2 tablets by mouth every morning.   metFORMIN  (GLUCOPHAGE ) 1000 MG tablet Take 1,000 mg by mouth 2 (two) times daily with a meal.   nitroGLYCERIN  (NITROSTAT ) 0.4 MG SL tablet Place 0.4 mg under the tongue every 5 (five) minutes as needed for chest pain.   pravastatin  (PRAVACHOL ) 20 MG tablet Take 20 mg by mouth at bedtime.   PRECISION XTRA TEST STRIPS test strip    predniSONE  (DELTASONE ) 10 MG tablet Take 3 tablets (30 mg total) by mouth daily. (Patient not taking: Reported on 02/06/2024)   No current facility-administered medications for this visit. (Other)   REVIEW OF SYSTEMS:      ALLERGIES No Known Allergies  PAST MEDICAL HISTORY Past Medical History:  Diagnosis Date   Anemia    Bradycardia    CAD (coronary artery disease)    Chest tightness    Chronic kidney disease    Diabetes mellitus without complication (HCC)    Diabetic retinopathy (HCC)    Dyslipidemia    Dyspnea on exertion    Glaucoma    Hypertension    Obesity    Paroxysmal atrial fibrillation (HCC)  a. identified on device interrogation   Pneumonia    Past Surgical History:  Procedure Laterality Date   ACHILLES TENDON SURGERY     for rupture   CARDIAC CATHETERIZATION  07/08/2009   mild to mod . prox LAD 50% (Dr. Orest)   CATARACT EXTRACTION     CHOLECYSTECTOMY N/A 10/11/2017   Procedure: LAPAROSCOPIC CHOLECYSTECTOMY WITH INTRAOPERATIVE CHOLANGIOGRAM;  Surgeon: Ethyl Lenis, MD;  [facility];  Service: General;  Laterality: N/A;   DOPPLER ECHOCARDIOGRAPHY  04/10/2008   LVEF 70-80% ,norm   EYE SURGERY     PACEMAKER IMPLANT N/A 10/12/2017   Procedure: PACEMAKER  IMPLANT;  Surgeon: Waddell Danelle ORN, MD;  Location: [facility];  Service: Cardiovascular;  Laterality: N/A;   stress myoview  04/09/2008   EF 54%; LV  norm   TEMPORARY PACEMAKER N/A 10/11/2017   Procedure: TEMPORARY PACEMAKER;  Surgeon: Waddell Danelle ORN, MD;  Location: [facility];  Service: Cardiovascular;  Laterality: N/A;   FAMILY HISTORY Family History  Problem Relation Age of Onset   Leukemia Mother    SOCIAL HISTORY Social History   Tobacco Use   Smoking status: Never   Smokeless tobacco: Never  Vaping Use   Vaping status: Never Used  Substance Use Topics   Alcohol use: No    Alcohol/week: 0.0 standard drinks of alcohol   Drug use: No       OPHTHALMIC EXAM:  Not recorded    IMAGING AND PROCEDURES  Imaging and Procedures for 03/20/2024          ASSESSMENT/PLAN:    ICD-10-CM   1. Proliferative diabetic retinopathy of both eyes with macular edema associated with type 2 diabetes mellitus (HCC)  Z88.6486     2. Current use of insulin  (HCC)  Z79.4     3. Long term (current) use of oral hypoglycemic drugs  Z79.84     4. Vitreous hemorrhage, right eye (HCC)  H43.11     5. Epiretinal membrane (ERM) of right eye  H35.371     6. Essential hypertension  I10     7. Hypertensive retinopathy of both eyes  H35.033     8. Pseudophakia, both eyes  Z96.1     9. Ocular hypertension of right eye  H40.051      1-4. Proliferative diabetic retinopathy OU w/ vitreous hemorrhage, OD   - A1C 7.4 (09.12.24) - s/p IVA OD #1 (12.19.22), #2 (01.16.23), #3 (02.16.23), #4 (04.04.23), #5 (09.13.23), #6 (10.11.23), #7 (11.27.23), #8 (01.29.24), #9 (03.25.24), #10 (05.13.24), #11 (07.02.24), #12 (08.20.24), #13 (10.22.24), #14 (12.17.24), #15 (02.25.25) - s/p IVA OS #1 (09.13.23), #2 (10.11.23), #3 (11.27.23), #4 (01.29.24), #5 (03.25.24), #6 (05.13.24), #7 (07.02.24), #8 (08.20.24), #9 (10.22.24), #10 (12.17.24), #11 (02.25.25) **IVA RESISTANCE  OU** ==================== - s/p IVE OU #1 (05.06.25), #2 (06.17.25), #3 (08.04.25), #4 (09.15.25), #5 (11.04.25)  - s/p PRP OD (02.27.23), (09.19.23)  - s/p PRP OS (05.28.24)  - referred by [facility] -- formerly followed by Dr. Guss - BCVA 20/25 OU -- stable - FA (09.05.23) OD: Perisistent vitreous opacities settled inferiorly inferiorly, pre-retinal fibrosis inferiorly -- stable, mild interval increase in cystic changes temporal fovea and mac OS: Mild NVD, vascular non-perfusion and perivascular leakage temporal periphery - OCT today shows NI:Dujaoz improvement in vitreous opacities, pre-retinal fibrosis inferiorly -- stable,  persistent cystic changes temporal fovea and mac -- slightly improved; OS: Mild interval improvement in IRF / IRHM temporal macula, stable improvement in vitreous opacities at 7 wks - recommend  IVE today OU #6 (12.17.25) with follow up in 6 weeks (due to holiday) - pt wishes to proceed with injections - RBA of procedure discussed, questions answered - see procedure note  - IVE informed consent obtained and signed, 05.06.25 (OU) - VH precautions reviewed -- minimize activities, keep head elevated, avoid ASA/NSAIDs/blood thinners as able  - f/u 6 weeks, DFE, OCT, possible injection(s)  5. Epiretinal membrane, right eye  - mild ERM OD - BCVA 20/25 - stable - asymptomatic, no metamorphopsia - no indication for surgery at this time - monitor  6,7. Hypertensive retinopathy OU - discussed importance of tight BP control - monitor  8. Pseudophakia OU  - s/p CE/IOL OU  - IOLs in good position, doing well  - monitor  9. Ocular Hypertension OU  - IOP 17,15 - continue Brimonidine  bid OD, Simbrinza  OU BID, and Timolol OU Qam  Ophthalmic Meds Ordered this visit:  No orders of the defined types were placed in this encounter.    No follow-ups on file.  There are no Patient Instructions on file for this visit.   Explained the diagnoses, plan, and follow up with the  patient and they expressed understanding.  Patient expressed understanding of the importance of proper follow up care.    This document serves as a record of services personally performed by Redell JUDITHANN Hans, MD, PhD. It was created on their behalf by Almetta Pesa, an ophthalmic technician. The creation of this record is the provider's dictation and/or activities during the visit.    Electronically signed by: Almetta Pesa, OA, 03/20/2024  7:19 AM  This document serves as a record of services personally performed by Redell JUDITHANN Hans, MD, PhD. It was created on their behalf by Wanda GEANNIE Keens, COT an ophthalmic technician. The creation of this record is the provider's dictation and/or activities during the visit.    Electronically signed by:  Wanda GEANNIE Keens, COT  03/20/2024 7:19 AM  Redell JUDITHANN Hans, M.D., Ph.D. Diseases & Surgery of the Retina and Vitreous [facility]   Abbreviations: M myopia (nearsighted); A astigmatism; H hyperopia (farsighted); P presbyopia; Mrx spectacle prescription;  CTL contact lenses; OD right eye; OS left eye; OU both eyes  XT exotropia; ET esotropia; PEK punctate epithelial keratitis; PEE punctate epithelial erosions; DES dry eye syndrome; MGD meibomian gland dysfunction; ATs artificial tears; PFAT's preservative free artificial tears; NSC nuclear sclerotic cataract; PSC posterior subcapsular cataract; ERM epi-retinal membrane; PVD posterior vitreous detachment; RD retinal detachment; DM diabetes mellitus; DR diabetic retinopathy; NPDR non-proliferative diabetic retinopathy; PDR proliferative diabetic retinopathy; CSME clinically significant macular edema; DME diabetic macular edema; dbh dot blot hemorrhages; CWS cotton wool spot; POAG primary open angle glaucoma; C/D cup-to-disc ratio; HVF humphrey visual field; GVF goldmann visual field; OCT optical coherence tomography; IOP intraocular pressure; BRVO Branch retinal vein occlusion;  CRVO central retinal vein occlusion; CRAO central retinal artery occlusion; BRAO branch retinal artery occlusion; RT retinal tear; SB scleral buckle; PPV pars plana vitrectomy; VH Vitreous hemorrhage; PRP panretinal laser photocoagulation; IVK intravitreal kenalog ; VMT vitreomacular traction; MH Macular hole;  NVD neovascularization of the disc; NVE neovascularization elsewhere; AREDS age related eye disease study; ARMD age related macular degeneration; POAG primary open angle glaucoma; EBMD epithelial/anterior basement membrane dystrophy; ACIOL anterior chamber intraocular lens; IOL intraocular lens; PCIOL posterior chamber intraocular lens; Phaco/IOL phacoemulsification with intraocular lens placement; PRK photorefractive keratectomy; LASIK laser assisted in situ keratomileusis; HTN hypertension; DM diabetes mellitus; COPD chronic obstructive pulmonary disease

## 2024-03-20 ENCOUNTER — Encounter (INDEPENDENT_AMBULATORY_CARE_PROVIDER_SITE_OTHER): Admitting: Ophthalmology

## 2024-03-20 ENCOUNTER — Encounter (INDEPENDENT_AMBULATORY_CARE_PROVIDER_SITE_OTHER): Payer: Self-pay | Admitting: Ophthalmology

## 2024-03-20 DIAGNOSIS — I10 Essential (primary) hypertension: Secondary | ICD-10-CM

## 2024-03-20 DIAGNOSIS — Z794 Long term (current) use of insulin: Secondary | ICD-10-CM

## 2024-03-20 DIAGNOSIS — Z961 Presence of intraocular lens: Secondary | ICD-10-CM

## 2024-03-20 DIAGNOSIS — H35371 Puckering of macula, right eye: Secondary | ICD-10-CM

## 2024-03-20 DIAGNOSIS — H35033 Hypertensive retinopathy, bilateral: Secondary | ICD-10-CM

## 2024-03-20 DIAGNOSIS — Z7984 Long term (current) use of oral hypoglycemic drugs: Secondary | ICD-10-CM

## 2024-03-20 DIAGNOSIS — H4311 Vitreous hemorrhage, right eye: Secondary | ICD-10-CM

## 2024-03-20 DIAGNOSIS — E113513 Type 2 diabetes mellitus with proliferative diabetic retinopathy with macular edema, bilateral: Secondary | ICD-10-CM

## 2024-03-20 DIAGNOSIS — H40051 Ocular hypertension, right eye: Secondary | ICD-10-CM

## 2024-03-20 NOTE — Progress Notes (Signed)
 This encounter was created in error - please disregard.

## 2024-04-01 NOTE — Progress Notes (Signed)
 " [facility] - Clinic Note  04/11/2024    CHIEF COMPLAINT Patient presents for Retina Follow Up   HISTORY OF PRESENT ILLNESS: Matthew Sellers is a 83 y.o. male who presents to the clinic today for:   HPI     Retina Follow Up   Patient presents with  Diabetic Retinopathy.  In both eyes.  This started 9 weeks ago.  I, the attending physician,  performed the HPI with the patient and updated documentation appropriately.        Comments   Patient here for 9 weeks retina follow up for PDR OU. Patient  states vision about the same. No eye pain. Latanoprost  at bedtime OU. Timolol in the am OU. Pt uses Refresh PRN OU. Brimonidine  TID OU.  BSL: 160 this morning  A1C: 7       Last edited by Valdemar Rogue, MD on 04/11/2024  2:15 PM.    Pt states   Referring physician: No referring provider defined for this encounter.  HISTORICAL INFORMATION:   Selected notes from the MEDICAL RECORD NUMBER [facility] LEE:  Ocular Hx- PMH   CURRENT MEDICATIONS: Current Outpatient Medications (Ophthalmic Drugs)  Medication Sig   brimonidine  (ALPHAGAN ) 0.2 % ophthalmic solution Place 1 drop into the right eye 2 (two) times daily.   Brinzolamide -Brimonidine  1-0.2 % SUSP Place 1 drop into both eyes 2 (two) times daily.   carboxymethylcellulose (REFRESH PLUS) 0.5 % SOLN INSTILL 1 DROP IN BOTH EYES FIVE TIMES A DAY   latanoprost  (XALATAN ) 0.005 % ophthalmic solution Place 1 drop into both eyes at bedtime.   Latanoprost  0.005 % EMUL    timolol (TIMOPTIC-XR) 0.5 % ophthalmic gel-forming SMARTSIG:In Eye(s)   No current facility-administered medications for this visit. (Ophthalmic Drugs)   Current Outpatient Medications (Other)  Medication Sig   amLODipine  (NORVASC ) 10 MG tablet Take 10 mg by mouth daily.   aspirin  EC 81 MG tablet Take 81 mg by mouth daily.   cefpodoxime  (VANTIN ) 200 MG tablet    fluticasone (FLONASE) 50 MCG/ACT nasal spray Place 2 sprays into both  nostrils daily as needed for allergies or rhinitis.   gabapentin (NEURONTIN) 100 MG capsule Take 1 capsule by mouth 3 (three) times daily.   HYDROcodone -acetaminophen  (NORCO/VICODIN) 5-325 MG tablet Take 1-2 tablets by mouth every 4 (four) hours as needed for moderate pain.   insulin  glargine (LANTUS ) 100 UNIT/ML injection Inject 30 Units into the skin at bedtime.    insulin  glargine-yfgn (SEMGLEE ) 100 UNIT/ML Pen INJECT 36 UNITS SUBCUTANEOUSLY DAILY FOR DIABETES (CONVERTED FROM LANTUS )   JARDIANCE 25 MG TABS tablet Take 1 tablet by mouth daily.   Krill Oil 300 MG CAPS Take 300 mg by mouth daily.   lidocaine  (LIDODERM ) 5 % Place 1 patch onto the skin daily. Remove & Discard patch within 12 hours or as directed by MD   lisinopril  (ZESTRIL ) 20 MG tablet Take 2 tablets by mouth daily.   lisinopril -hydrochlorothiazide  (ZESTORETIC ) 20-25 MG tablet Take 2 tablets by mouth every morning.   metFORMIN  (GLUCOPHAGE ) 1000 MG tablet Take 1,000 mg by mouth 2 (two) times daily with a meal.   nitroGLYCERIN  (NITROSTAT ) 0.4 MG SL tablet Place 0.4 mg under the tongue every 5 (five) minutes as needed for chest pain.   pravastatin  (PRAVACHOL ) 20 MG tablet Take 20 mg by mouth at bedtime.   PRECISION XTRA TEST STRIPS test strip    predniSONE  (DELTASONE ) 10 MG tablet Take 3 tablets (30  mg total) by mouth daily.   No current facility-administered medications for this visit. (Other)   REVIEW OF SYSTEMS:      ALLERGIES No Known Allergies  PAST MEDICAL HISTORY Past Medical History:  Diagnosis Date   Anemia    Bradycardia    CAD (coronary artery disease)    Chest tightness    Chronic kidney disease    Diabetes mellitus without complication (HCC)    Diabetic retinopathy (HCC)    Dyslipidemia    Dyspnea on exertion    Glaucoma    Hypertension    Obesity    Paroxysmal atrial fibrillation (HCC)    a. identified on device interrogation   Pneumonia    Past Surgical History:  Procedure Laterality Date    ACHILLES TENDON SURGERY     for rupture   CARDIAC CATHETERIZATION  07/08/2009   mild to mod . prox LAD 50% (Dr. Orest)   CATARACT EXTRACTION     CHOLECYSTECTOMY N/A 10/11/2017   Procedure: LAPAROSCOPIC CHOLECYSTECTOMY WITH INTRAOPERATIVE CHOLANGIOGRAM;  Surgeon: Ethyl Lenis, MD;  Location: [facility] OR;  Service: General;  Laterality: N/A;   DOPPLER ECHOCARDIOGRAPHY  04/10/2008   LVEF 70-80% ,norm   EYE SURGERY     PACEMAKER IMPLANT N/A 10/12/2017   Procedure: PACEMAKER IMPLANT;  Surgeon: Waddell Danelle ORN, MD;  Location: [facility] INVASIVE [facility] LAB;  Service: Cardiovascular;  Laterality: N/A;   stress myoview  04/09/2008   EF 54%; LV  norm   TEMPORARY PACEMAKER N/A 10/11/2017   Procedure: TEMPORARY PACEMAKER;  Surgeon: Waddell Danelle ORN, MD;  Location: [facility] INVASIVE CV LAB;  Service: Cardiovascular;  Laterality: N/A;   FAMILY HISTORY Family History  Problem Relation Age of Onset   Leukemia Mother    SOCIAL HISTORY Social History   Tobacco Use   Smoking status: Never   Smokeless tobacco: Never  Vaping Use   Vaping status: Never Used  Substance Use Topics   Alcohol use: No    Alcohol/week: 0.0 standard drinks of alcohol   Drug use: No       OPHTHALMIC EXAM:  Base Eye Exam     Visual Acuity (Snellen - Linear)       Right Left   Dist cc 20/25 -3 20/25 +1   Dist ph cc NI NI    Correction: Glasses         Tonometry (Tonopen, 9:34 AM)       Right Left   Pressure 20 19         Pupils       Pupils Dark Light Shape React APD   Right PERRL 3 2 Round Brisk None   Left PERRL 3 2 Round Brisk None         Visual Fields       Left Right    Full Full         Extraocular Movement       Right Left    Full, Ortho Full, Ortho         Neuro/Psych     Oriented x3: Yes   Mood/Affect: Normal         Dilation     Both eyes: 1.0% Mydriacyl, 2.5% Phenylephrine @ 9:34 AM           Slit Lamp and Fundus Exam     Slit Lamp Exam       Right Left   Lids/Lashes  Dermatochalasis - upper lid, mild MGD Dermatochalasis - upper lid, mild MGD   Conjunctiva/Sclera  nasal pingeucula, Melanosis nasal pingeucula, Melanosis   Cornea 2+ Punctate epithelial erosions, well healed cataract wound, Arcus mild arcus, well healed cataract wound, 1-2+ Punctate epithelial erosions, Debris in tear film   Anterior Chamber deep and clear deep and clear   Iris round and moderately dilated, No NVI round and moderdilated to 4mm, No NVI   Lens PC IOL in good position PC IOL in good position   Anterior Vitreous Mild Vitreous syneresis, Posterior vitreous detachment, blood stained vitreous condensations -- improved Mild Vitreous syneresis, Posterior vitreous detachment, vitreous condensations, stable improvement in blood stained vitreous condensations         Fundus Exam       Right Left   Disc 2+Pallor, Sharp rim, +cupping mild Pallor, Sharp rim, +cupping, focal PPP temporal, vascular loops, early NVD -- stably improved   C/D Ratio 0.8 0.5   Macula Flat, good foveal reflex, scattered Microaneurysms, trace cystic changes -- improved, RPE mottling and clumping Flat, good foveal reflex, rare MA/DBH, mild cystic changes temporal mac -- stably improved   Vessels attenuated, mild tortuosity, mild fibrosis along IT arcades attenuated, Tortuous, no obvious NV, Copper wiring   Periphery Attached, +NV along inferior arcades and nasal midzone - improved, 360 MA/DBH, good 360 PRP changes -- with good fill in, scattered fibrosis, ?shallow schisis nasal periphery Attached, 360 MA/DBH, focal exudates nasal midzone, mild PRP laser changes 360           Refraction     Wearing Rx       Sphere Cylinder Axis Add   Right -0.75 +1.25 004 +2.75   Left -0.75 +1.25 004 +2.75    Type: PAL           IMAGING AND PROCEDURES  Imaging and Procedures for 04/11/2024  OCT, Retina - OU - Both Eyes       Right Eye Quality was good. Central Foveal Thickness: 274. Progression has improved.  Findings include no IRF, no SRF, abnormal foveal contour, epiretinal membrane, preretinal fibrosis (Stable improvement in vitreous opacities, pre-retinal fibrosis inferiorly -- stable, interval improvement in cystic changes temporal fovea and mac ).   Left Eye Quality was good. Central Foveal Thickness: 262. Progression has been stable. Findings include normal foveal contour, no IRF, no SRF, intraretinal hyper-reflective material, intraretinal fluid (Stable improvement in IRF / IRHM temporal macula, stable improvement in vitreous opacities).   Notes *Images captured and stored on drive  Diagnosis / Impression:  OD: Stable improvement in vitreous opacities, pre-retinal fibrosis inferiorly -- stable, interval improvement in cystic changes temporal fovea and mac  OS: Stable improvement in IRF / IRHM temporal macula, stable improvement in vitreous opacities  Clinical management:  See below  Abbreviations: NFP - Normal foveal profile. CME - cystoid macular edema. PED - pigment epithelial detachment. IRF - intraretinal fluid. SRF - subretinal fluid. EZ - ellipsoid zone. ERM - epiretinal membrane. ORA - outer retinal atrophy. ORT - outer retinal tubulation. SRHM - subretinal hyper-reflective material. IRHM - intraretinal hyper-reflective material      Intravitreal Injection, Pharmacologic Agent - OD - Right Eye       Time Out 04/11/2024. 11:12 AM. Confirmed correct patient, procedure, site, and patient consented.   Anesthesia Topical anesthesia was used. Anesthetic medications included Lidocaine  2%, Proparacaine 0.5%.   Procedure Preparation included 5% betadine to ocular surface, eyelid speculum. A (32g) needle was used.   Injection: 2 mg aflibercept  2 MG/0.05ML   Route: Intravitreal, Site: Right Eye   NDC: D2246706, Lot: [id number],  Expiration date: 04/03/2025, Waste: 0 mL   Post-op Post injection exam found visual acuity of at least counting fingers. The patient tolerated the  procedure well. There were no complications. The patient received written and verbal post procedure care education. Post injection medications were not given.      Intravitreal Injection, Pharmacologic Agent - OS - Left Eye       Time Out 04/11/2024. 11:13 AM. Confirmed correct patient, procedure, site, and patient consented.   Anesthesia Topical anesthesia was used. Anesthetic medications included Lidocaine  2%, Proparacaine 0.5%.   Procedure Preparation included 5% betadine to ocular surface, eyelid speculum. A (32g) needle was used.   Injection: 2 mg aflibercept  2 MG/0.05ML   Route: Intravitreal, Site: Left Eye   NDC: Q956576, Lot: [id number], Expiration date: 04/03/2025, Waste: 0 mL   Post-op Post injection exam found visual acuity of at least counting fingers. The patient tolerated the procedure well. There were no complications. The patient received written and verbal post procedure care education. Post injection medications were not given.            ASSESSMENT/PLAN:    ICD-10-CM   1. Proliferative diabetic retinopathy of both eyes with macular edema associated with type 2 diabetes mellitus (HCC)  E11.3513 OCT, Retina - OU - Both Eyes    Intravitreal Injection, Pharmacologic Agent - OD - Right Eye    Intravitreal Injection, Pharmacologic Agent - OS - Left Eye    aflibercept  (EYLEA ) SOLN 2 mg    aflibercept  (EYLEA ) SOLN 2 mg    2. Current use of insulin  (HCC)  Z79.4     3. Long term (current) use of oral hypoglycemic drugs  Z79.84     4. Vitreous hemorrhage, right eye (HCC)  H43.11     5. Epiretinal membrane (ERM) of right eye  H35.371     6. Essential hypertension  I10     7. Hypertensive retinopathy of both eyes  H35.033     8. Pseudophakia, both eyes  Z96.1     9. Ocular hypertension of right eye  H40.051      1-4. Proliferative diabetic retinopathy OU w/ vitreous hemorrhage, OD   - A1C 7.4 (09.12.24) - s/p IVA OD #1 (12.19.22), #2 (01.16.23), #3  (02.16.23), #4 (04.04.23), #5 (09.13.23), #6 (10.11.23), #7 (11.27.23), #8 (01.29.24), #9 (03.25.24), #10 (05.13.24), #11 (07.02.24), #12 (08.20.24), #13 (10.22.24), #14 (12.17.24), #15 (02.25.25) - s/p IVA OS #1 (09.13.23), #2 (10.11.23), #3 (11.27.23), #4 (01.29.24), #5 (03.25.24), #6 (05.13.24), #7 (07.02.24), #8 (08.20.24), #9 (10.22.24), #10 (12.17.24), #11 (02.25.25) **IVA RESISTANCE OU** ==================== - s/p IVE OU #1 (05.06.25), #2 (06.17.25), #3 (08.04.25), #4 (09.15.25), #5 (11.04.25)  - s/p PRP OD (02.27.23), (09.19.23)  - s/p PRP OS (05.28.24)  - referred by [facility] -- formerly followed by Dr. Guss - BCVA 20/25 OU -- stable - FA (09.05.23) OD: Perisistent vitreous opacities settled inferiorly inferiorly, pre-retinal fibrosis inferiorly -- stable, mild interval increase in cystic changes temporal fovea and mac OS: Mild NVD, vascular non-perfusion and perivascular leakage temporal periphery - OCT today shows NI:Dujaoz improvement in vitreous opacities, pre-retinal fibrosis inferiorly -- stable, interval improvement in cystic changes temporal fovea and mac; OS: Stable improvement in IRF / IRHM temporal macula, stable improvement in vitreous opacities at 9 wks - recommend IVE today OU #6 (01.08.26) with follow up in 9 weeks  - pt wishes to proceed with injections - RBA of procedure discussed, questions answered - see procedure note  - IVE informed consent obtained and signed,  05.06.25 (OU) - VH precautions reviewed -- minimize activities, keep head elevated, avoid ASA/NSAIDs/blood thinners as able  - f/u 9 weeks, DFE, OCT, possible injection(s)  5. Epiretinal membrane, right eye  - mild ERM OD - BCVA 20/25 - stable - asymptomatic, no metamorphopsia - no indication for surgery at this time - monitor  6,7. Hypertensive retinopathy OU - discussed importance of tight BP control - monitor  8. Pseudophakia OU  - s/p CE/IOL OU  - IOLs in good position, doing well  - monitor  9.  Ocular Hypertension OU  - IOP 20, 19 - continue Brimonidine  bid OD, Simbrinza  OU BID, and Timolol OU Qam  Ophthalmic Meds Ordered this visit:  Meds ordered this encounter  Medications   aflibercept  (EYLEA ) SOLN 2 mg   aflibercept  (EYLEA ) SOLN 2 mg     Return in about 4 weeks (around 05/09/2024) for f/u, PDR, DFE, OCT, Possible, IVE, OU.  There are no Patient Instructions on file for this visit.   Explained the diagnoses, plan, and follow up with the patient and they expressed understanding.  Patient expressed understanding of the importance of proper follow up care.   This document serves as a record of services personally performed by Redell JUDITHANN Hans, MD, PhD. It was created on their behalf by Avelina Pereyra, COA an ophthalmic technician. The creation of this record is the provider's dictation and/or activities during the visit.   Electronically signed by: Avelina GORMAN Pereyra, COT  04/21/24  3:09 PM    Redell JUDITHANN Hans, M.D., Ph.D. Diseases & Surgery of the Retina and Vitreous [facility]  I have reviewed the above documentation for accuracy and completeness, and I agree with the above. Redell JUDITHANN Hans, M.D., Ph.D. 04/21/24 3:10 PM   Abbreviations: M myopia (nearsighted); A astigmatism; H hyperopia (farsighted); P presbyopia; Mrx spectacle prescription;  CTL contact lenses; OD right eye; OS left eye; OU both eyes  XT exotropia; ET esotropia; PEK punctate epithelial keratitis; PEE punctate epithelial erosions; DES dry eye syndrome; MGD meibomian gland dysfunction; ATs artificial tears; PFAT's preservative free artificial tears; NSC nuclear sclerotic cataract; PSC posterior subcapsular cataract; ERM epi-retinal membrane; PVD posterior vitreous detachment; RD retinal detachment; DM diabetes mellitus; DR diabetic retinopathy; NPDR non-proliferative diabetic retinopathy; PDR proliferative diabetic retinopathy; CSME clinically significant macular edema; DME diabetic macular  edema; dbh dot blot hemorrhages; CWS cotton wool spot; POAG primary open angle glaucoma; C/D cup-to-disc ratio; HVF humphrey visual field; GVF goldmann visual field; OCT optical coherence tomography; IOP intraocular pressure; BRVO Branch retinal vein occlusion; CRVO central retinal vein occlusion; CRAO central retinal artery occlusion; BRAO branch retinal artery occlusion; RT retinal tear; SB scleral buckle; PPV pars plana vitrectomy; VH Vitreous hemorrhage; PRP panretinal laser photocoagulation; IVK intravitreal kenalog ; VMT vitreomacular traction; MH Macular hole;  NVD neovascularization of the disc; NVE neovascularization elsewhere; AREDS age related eye disease study; ARMD age related macular degeneration; POAG primary open angle glaucoma; EBMD epithelial/anterior basement membrane dystrophy; ACIOL anterior chamber intraocular lens; IOL intraocular lens; PCIOL posterior chamber intraocular lens; Phaco/IOL phacoemulsification with intraocular lens placement; PRK photorefractive keratectomy; LASIK laser assisted in situ keratomileusis; HTN hypertension; DM diabetes mellitus; COPD chronic obstructive pulmonary disease "

## 2024-04-11 ENCOUNTER — Encounter (INDEPENDENT_AMBULATORY_CARE_PROVIDER_SITE_OTHER): Payer: Self-pay | Admitting: Ophthalmology

## 2024-04-11 ENCOUNTER — Ambulatory Visit (INDEPENDENT_AMBULATORY_CARE_PROVIDER_SITE_OTHER): Admitting: Ophthalmology

## 2024-04-11 DIAGNOSIS — H35033 Hypertensive retinopathy, bilateral: Secondary | ICD-10-CM | POA: Diagnosis not present

## 2024-04-11 DIAGNOSIS — Z961 Presence of intraocular lens: Secondary | ICD-10-CM

## 2024-04-11 DIAGNOSIS — I10 Essential (primary) hypertension: Secondary | ICD-10-CM | POA: Diagnosis not present

## 2024-04-11 DIAGNOSIS — Z794 Long term (current) use of insulin: Secondary | ICD-10-CM

## 2024-04-11 DIAGNOSIS — Z7984 Long term (current) use of oral hypoglycemic drugs: Secondary | ICD-10-CM | POA: Diagnosis not present

## 2024-04-11 DIAGNOSIS — H40051 Ocular hypertension, right eye: Secondary | ICD-10-CM | POA: Diagnosis not present

## 2024-04-11 DIAGNOSIS — E113513 Type 2 diabetes mellitus with proliferative diabetic retinopathy with macular edema, bilateral: Secondary | ICD-10-CM | POA: Diagnosis not present

## 2024-04-11 DIAGNOSIS — H4311 Vitreous hemorrhage, right eye: Secondary | ICD-10-CM | POA: Diagnosis not present

## 2024-04-11 DIAGNOSIS — H35371 Puckering of macula, right eye: Secondary | ICD-10-CM

## 2024-04-11 MED ORDER — AFLIBERCEPT 2MG/0.05ML IZ SOLN FOR KALEIDOSCOPE
2.0000 mg | INTRAVITREAL | Status: AC | PRN
  Administered 2024-04-11: 2 mg via INTRAVITREAL

## 2024-06-17 ENCOUNTER — Encounter (INDEPENDENT_AMBULATORY_CARE_PROVIDER_SITE_OTHER): Admitting: Ophthalmology
